# Patient Record
Sex: Male | Born: 1960
Health system: Southern US, Community
[De-identification: ages and names within clinical notes are randomized; demographics above are authoritative.]

## PROBLEM LIST (undated history)

## (undated) DIAGNOSIS — K5190 Ulcerative colitis, unspecified, without complications: Secondary | ICD-10-CM

## (undated) DIAGNOSIS — M069 Rheumatoid arthritis, unspecified: Secondary | ICD-10-CM

## (undated) DIAGNOSIS — I10 Essential (primary) hypertension: Secondary | ICD-10-CM

## (undated) HISTORY — PX: VASECTOMY: SHX75

## (undated) HISTORY — PX: HERNIA REPAIR: SHX51

## (undated) HISTORY — PX: SQUAMOUS CELL CARCINOMA EXCISION: SHX2433

## (undated) HISTORY — PX: MOLE REMOVAL: SHX2046

## (undated) HISTORY — PX: CYST REMOVAL NECK: SHX6281

## (undated) HISTORY — DX: Essential (primary) hypertension: I10

---

## 2010-04-02 ENCOUNTER — Inpatient Hospital Stay (HOSPITAL_COMMUNITY): Admission: EM | Admit: 2010-04-02 | Discharge: 2010-04-04 | Payer: Self-pay | Admitting: Internal Medicine

## 2010-04-02 ENCOUNTER — Ambulatory Visit: Payer: Self-pay | Admitting: Diagnostic Radiology

## 2010-04-02 ENCOUNTER — Encounter: Payer: Self-pay | Admitting: Emergency Medicine

## 2010-09-07 LAB — DIFFERENTIAL
Basophils Absolute: 0 10*3/uL (ref 0.0–0.1)
Eosinophils Absolute: 0.2 10*3/uL (ref 0.0–0.7)
Eosinophils Relative: 1 % (ref 0–5)
Lymphocytes Relative: 5 % — ABNORMAL LOW (ref 12–46)
Monocytes Absolute: 0.3 10*3/uL (ref 0.1–1.0)
Monocytes Relative: 10 % (ref 3–12)
Myelocytes: 0 %
Neutro Abs: 12.9 10*3/uL — ABNORMAL HIGH (ref 1.7–7.7)
Neutro Abs: 13.7 10*3/uL — ABNORMAL HIGH (ref 1.7–7.7)
Neutrophils Relative %: 60 % (ref 43–77)
nRBC: 0 /100 WBC

## 2010-09-07 LAB — CLOSTRIDIUM DIFFICILE EIA

## 2010-09-07 LAB — URINE MICROSCOPIC-ADD ON

## 2010-09-07 LAB — URINALYSIS, ROUTINE W REFLEX MICROSCOPIC
Glucose, UA: NEGATIVE mg/dL
Ketones, ur: 40 mg/dL — AB
Leukocytes, UA: NEGATIVE
pH: 6 (ref 5.0–8.0)

## 2010-09-07 LAB — CBC
HCT: 31.1 % — ABNORMAL LOW (ref 39.0–52.0)
HCT: 35.9 % — ABNORMAL LOW (ref 39.0–52.0)
HCT: 43.3 % (ref 39.0–52.0)
Hemoglobin: 10.7 g/dL — ABNORMAL LOW (ref 13.0–17.0)
Hemoglobin: 12.2 g/dL — ABNORMAL LOW (ref 13.0–17.0)
MCH: 30.4 pg (ref 26.0–34.0)
MCHC: 33.4 g/dL (ref 30.0–36.0)
MCV: 91 fL (ref 78.0–100.0)
RBC: 3.49 MIL/uL — ABNORMAL LOW (ref 4.22–5.81)
RDW: 12.5 % (ref 11.5–15.5)
RDW: 13.6 % (ref 11.5–15.5)
WBC: 13.9 10*3/uL — ABNORMAL HIGH (ref 4.0–10.5)
WBC: 17.9 10*3/uL — ABNORMAL HIGH (ref 4.0–10.5)

## 2010-09-07 LAB — HEPATITIS B SURFACE ANTIGEN: Hepatitis B Surface Ag: NEGATIVE

## 2010-09-07 LAB — URINE CULTURE

## 2010-09-07 LAB — BASIC METABOLIC PANEL
BUN: 3 mg/dL — ABNORMAL LOW (ref 6–23)
GFR calc Af Amer: >60 mL/min (ref >60)
GFR calc non Af Amer: >60 mL/min (ref >60)
GFR calc non Af Amer: >60 mL/min (ref >60)
Glucose, Bld: 146 mg/dL — ABNORMAL HIGH (ref 70–99)
Potassium: 3.4 mEq/L — ABNORMAL LOW (ref 3.5–5.1)
Potassium: 3.7 mEq/L (ref 3.5–5.1)
Sodium: 138 mEq/L (ref 135–145)
Sodium: 141 mEq/L (ref 135–145)

## 2010-09-07 LAB — COMPREHENSIVE METABOLIC PANEL
Alkaline Phosphatase: 114 U/L (ref 39–117)
BUN: 6 mg/dL (ref 6–23)
Calcium: 8.8 mg/dL (ref 8.4–10.5)
Glucose, Bld: 100 mg/dL — ABNORMAL HIGH (ref 70–99)
Total Protein: 6.9 g/dL (ref 6.0–8.3)

## 2010-11-30 ENCOUNTER — Other Ambulatory Visit: Payer: Self-pay | Admitting: Gastroenterology

## 2010-11-30 ENCOUNTER — Ambulatory Visit (HOSPITAL_COMMUNITY)
Admission: RE | Admit: 2010-11-30 | Discharge: 2010-11-30 | Disposition: A | Discharge status: Home / Self Care | Payer: PRIVATE HEALTH INSURANCE | Source: Ambulatory Visit | Attending: Gastroenterology | Admitting: Gastroenterology

## 2010-11-30 DIAGNOSIS — K5130 Ulcerative (chronic) rectosigmoiditis without complications: Secondary | ICD-10-CM | POA: Insufficient documentation

## 2010-11-30 DIAGNOSIS — R197 Diarrhea, unspecified: Secondary | ICD-10-CM | POA: Insufficient documentation

## 2010-11-30 DIAGNOSIS — Z79899 Other long term (current) drug therapy: Secondary | ICD-10-CM | POA: Insufficient documentation

## 2010-12-11 NOTE — Op Note (Signed)
  [name], [name]:  [id number]  MEDICAL RECORD NO.:  [id number]  LOCATION:  [facility]                         FACILITY:  [facility]  PHYSICIAN:  Earle Gell, M.D.   DATE OF BIRTH:  01-29-1961  DATE OF PROCEDURE:  11/30/2010 DATE OF DISCHARGE:                              OPERATIVE REPORT   PROCEDURE:  Surveillance colonoscopy.  REFERRING PHYSICIAN:  Wenda Low, MD.  HISTORY:  [name] is a 50 year old male born [birth year].  In 1994, the patient underwent a normal colonoscopy.  In 2005 and in 2008, the patient's flexible proctosigmoidoscopies showed ulcerative proctitis.  In October 2011, the patient developed Clostridium difficile colitis manifested by diarrhea, positive stool toxin, and a CT scan of the abdomen showing pancolitis.  The patient is currently taking mesalamine and reports normal bowel function.  ENDOSCOPIST:  Earle Gell, M.D.  PREMEDICATIONS:  Benadryl 50 mg, Versed 10 mg, fentanyl 100 mcg.  PROCEDURE:  Anal inspection and digital rectal exam were normal.  The Pentax pediatric colonoscope was introduced into the rectum and easily advanced to the cecum.  A normal-appearing ileocecal valve and appendiceal orifice were identified.  The distal ileum was intubated and the distal ileum was inspected.  Colonic preparation for the exam today was good.  Rectum:  The patient has moderate proctitis characterized by mucosal friability and ulceration.  There are a few small pseudopolyps in the proximal rectum.  Sigmoid colon:  There is moderate sigmoiditis manifested by friable mucosa, ulceration, and a few small pseudo polyps.  Descending colon normal.  Splenic flexure normal.  Transverse colon normal.  Hepatic flexure normal.  Ascending colon normal.  Cecum and ileocecal valve normal.  Distal ileum normal.  BIOPSIES:  Eight biopsies were taken from the right colon, eight biopsies were taken from the  transverse colon, eight biopsies were taken from the descending colon, eight biopsies were taken from the rectosigmoid colon.  Biopsies were submitted in a separate bottle for the pseudopolyps in the sigmoid colon.  ASSESSMENT:  Moderate proctosigmoiditis.  A normal-appearing mucosa extending from the distal descending colon to the cecum.  Terminal ileum appears normal.  Surveillance biopsies performed along the length of the colon pending.          ______________________________ Earle Gell, M.D.     MJ/MEDQ  D:  11/30/2010  T:  11/30/2010  Job:  [id number]  cc:   Wenda Low, MD Fax: [phone]  Electronically Signed by Earle Gell M.D. on 12/11/2010 04:15:40 PM

## 2011-03-15 ENCOUNTER — Other Ambulatory Visit: Payer: Self-pay | Admitting: Internal Medicine

## 2011-03-15 ENCOUNTER — Ambulatory Visit
Admission: RE | Admit: 2011-03-15 | Discharge: 2011-03-15 | Disposition: A | Discharge status: Home / Self Care | Payer: PRIVATE HEALTH INSURANCE | Source: Ambulatory Visit | Attending: Internal Medicine | Admitting: Internal Medicine

## 2011-03-15 DIAGNOSIS — R609 Edema, unspecified: Secondary | ICD-10-CM

## 2011-03-15 DIAGNOSIS — R52 Pain, unspecified: Secondary | ICD-10-CM

## 2013-08-16 ENCOUNTER — Encounter (HOSPITAL_COMMUNITY): Payer: Self-pay | Admitting: Emergency Medicine

## 2013-08-16 ENCOUNTER — Inpatient Hospital Stay (HOSPITAL_COMMUNITY)
Admission: EM | Admit: 2013-08-16 | Discharge: 2013-08-20 | DRG: 387 | Disposition: A | Discharge status: Home / Self Care | Payer: PRIVATE HEALTH INSURANCE | Attending: Internal Medicine | Admitting: Internal Medicine

## 2013-08-16 DIAGNOSIS — K5100 Ulcerative (chronic) pancolitis without complications: Principal | ICD-10-CM | POA: Diagnosis present

## 2013-08-16 DIAGNOSIS — R112 Nausea with vomiting, unspecified: Secondary | ICD-10-CM | POA: Diagnosis present

## 2013-08-16 DIAGNOSIS — D638 Anemia in other chronic diseases classified elsewhere: Secondary | ICD-10-CM | POA: Diagnosis present

## 2013-08-16 DIAGNOSIS — D126 Benign neoplasm of colon, unspecified: Secondary | ICD-10-CM | POA: Diagnosis present

## 2013-08-16 DIAGNOSIS — D72829 Elevated white blood cell count, unspecified: Secondary | ICD-10-CM

## 2013-08-16 DIAGNOSIS — Z862 Personal history of diseases of the blood and blood-forming organs and certain disorders involving the immune mechanism: Secondary | ICD-10-CM | POA: Diagnosis present

## 2013-08-16 DIAGNOSIS — D473 Essential (hemorrhagic) thrombocythemia: Secondary | ICD-10-CM | POA: Diagnosis present

## 2013-08-16 DIAGNOSIS — D500 Iron deficiency anemia secondary to blood loss (chronic): Secondary | ICD-10-CM | POA: Diagnosis present

## 2013-08-16 DIAGNOSIS — K5190 Ulcerative colitis, unspecified, without complications: Secondary | ICD-10-CM | POA: Diagnosis present

## 2013-08-16 DIAGNOSIS — D75839 Thrombocytosis, unspecified: Secondary | ICD-10-CM

## 2013-08-16 DIAGNOSIS — D649 Anemia, unspecified: Secondary | ICD-10-CM

## 2013-08-16 HISTORY — DX: Ulcerative colitis, unspecified, without complications: K51.90

## 2013-08-16 LAB — CBC WITH DIFFERENTIAL/PLATELET
BASOS ABS: 0 10*3/uL (ref 0.0–0.1)
Basophils Relative: 0 % (ref 0–1)
EOS ABS: 0 10*3/uL (ref 0.0–0.7)
Eosinophils Relative: 0 % (ref 0–5)
HCT: 40.1 % (ref 39.0–52.0)
Hemoglobin: 13.8 g/dL (ref 13.0–17.0)
LYMPHS PCT: 4 % — AB (ref 12–46)
Lymphs Abs: 1.1 10*3/uL (ref 0.7–4.0)
MCH: 31 pg (ref 26.0–34.0)
MCHC: 34.4 g/dL (ref 30.0–36.0)
MCV: 90.1 fL (ref 78.0–100.0)
MONOS PCT: 11 % (ref 3–12)
Monocytes Absolute: 3.2 10*3/uL — ABNORMAL HIGH (ref 0.1–1.0)
NEUTROS ABS: 24.4 10*3/uL — AB (ref 1.7–7.7)
NEUTROS PCT: 85 % — AB (ref 43–77)
PLATELETS: 575 10*3/uL — AB (ref 150–400)
RBC: 4.45 MIL/uL (ref 4.22–5.81)
RDW: 13.1 % (ref 11.5–15.5)
WBC MORPHOLOGY: INCREASED
WBC: 28.7 10*3/uL — AB (ref 4.0–10.5)

## 2013-08-16 LAB — COMPREHENSIVE METABOLIC PANEL
ALBUMIN: 3.1 g/dL — AB (ref 3.5–5.2)
ALK PHOS: 99 U/L (ref 39–117)
ALT: 10 U/L (ref 0–53)
AST: 11 U/L (ref 0–37)
BUN: 7 mg/dL (ref 6–23)
CO2: 24 mEq/L (ref 19–32)
Calcium: 9.1 mg/dL (ref 8.4–10.5)
Chloride: 96 mEq/L (ref 96–112)
Creatinine, Ser: 1.18 mg/dL (ref 0.50–1.35)
GFR calc Af Amer: 81 mL/min — ABNORMAL LOW (ref >90)
GFR calc non Af Amer: 70 mL/min — ABNORMAL LOW (ref >90)
Glucose, Bld: 131 mg/dL — ABNORMAL HIGH (ref 70–99)
POTASSIUM: 3.7 meq/L (ref 3.7–5.3)
SODIUM: 136 meq/L — AB (ref 137–147)
TOTAL PROTEIN: 7.1 g/dL (ref 6.0–8.3)
Total Bilirubin: 0.5 mg/dL (ref 0.3–1.2)

## 2013-08-16 LAB — URINALYSIS, ROUTINE W REFLEX MICROSCOPIC
GLUCOSE, UA: NEGATIVE mg/dL
Ketones, ur: NEGATIVE mg/dL
LEUKOCYTES UA: NEGATIVE
NITRITE: NEGATIVE
PH: 6 (ref 5.0–8.0)
Protein, ur: 100 mg/dL — AB
SPECIFIC GRAVITY, URINE: 1.029 (ref 1.005–1.030)
Urobilinogen, UA: 1 mg/dL (ref 0.0–1.0)

## 2013-08-16 LAB — SEDIMENTATION RATE: Sed Rate: 92 mm/hr — ABNORMAL HIGH (ref 0–16)

## 2013-08-16 LAB — URINE MICROSCOPIC-ADD ON

## 2013-08-16 LAB — LIPASE, BLOOD: LIPASE: 17 U/L (ref 11–59)

## 2013-08-16 LAB — PROCALCITONIN: PROCALCITONIN: 0.27 ng/mL

## 2013-08-16 MED ORDER — ONDANSETRON HCL 4 MG/2ML IJ SOLN
4.0000 mg | Freq: Once | INTRAMUSCULAR | Status: AC
  Administered 2013-08-16: 4 mg via INTRAVENOUS
  Filled 2013-08-16: qty 2

## 2013-08-16 MED ORDER — SODIUM CHLORIDE 0.9 % IV SOLN
1000.0000 mL | Freq: Once | INTRAVENOUS | Status: AC
  Administered 2013-08-16: 1000 mL via INTRAVENOUS

## 2013-08-16 MED ORDER — SULFASALAZINE 500 MG PO TABS
500.0000 mg | ORAL_TABLET | Freq: Two times a day (BID) | ORAL | Status: DC
Start: 2013-08-17 — End: 2013-08-20
  Administered 2013-08-17 – 2013-08-20 (×7): 500 mg via ORAL
  Filled 2013-08-16 (×8): qty 1

## 2013-08-16 MED ORDER — ACETAMINOPHEN 325 MG PO TABS: 650.0000 mg | ORAL_TABLET | Freq: Four times a day (QID) | ORAL | Status: DC | PRN

## 2013-08-16 MED ORDER — MORPHINE SULFATE 2 MG/ML IJ SOLN: 1.0000 mg | INTRAMUSCULAR | Status: DC | PRN

## 2013-08-16 MED ORDER — OXYCODONE HCL 5 MG PO TABS: 5.0000 mg | ORAL_TABLET | ORAL | Status: DC | PRN

## 2013-08-16 MED ORDER — ONDANSETRON HCL 4 MG PO TABS: 4.0000 mg | ORAL_TABLET | Freq: Four times a day (QID) | ORAL | Status: DC | PRN

## 2013-08-16 MED ORDER — ENOXAPARIN SODIUM 40 MG/0.4ML ~~LOC~~ SOLN
40.0000 mg | SUBCUTANEOUS | Status: DC
  Administered 2013-08-16: 40 mg via SUBCUTANEOUS
  Filled 2013-08-16 (×2): qty 0.4

## 2013-08-16 MED ORDER — METHYLPREDNISOLONE SODIUM SUCC 40 MG IJ SOLR
40.0000 mg | Freq: Two times a day (BID) | INTRAMUSCULAR | Status: DC
Start: 2013-08-16 — End: 2013-08-20
  Administered 2013-08-16 – 2013-08-20 (×8): 40 mg via INTRAVENOUS
  Filled 2013-08-16 (×10): qty 1

## 2013-08-16 MED ORDER — ONDANSETRON HCL 4 MG/2ML IJ SOLN: 4.0000 mg | Freq: Four times a day (QID) | INTRAMUSCULAR | Status: DC | PRN

## 2013-08-16 MED ORDER — ACETAMINOPHEN 650 MG RE SUPP: 650.0000 mg | Freq: Four times a day (QID) | RECTAL | Status: DC | PRN

## 2013-08-16 MED ORDER — SODIUM CHLORIDE 0.9 % IV SOLN
INTRAVENOUS | Status: DC
  Administered 2013-08-16 – 2013-08-19 (×8): via INTRAVENOUS

## 2013-08-16 MED ORDER — ONDANSETRON HCL 4 MG/2ML IJ SOLN: 4.0000 mg | Freq: Three times a day (TID) | INTRAMUSCULAR | Status: AC | PRN

## 2013-08-16 NOTE — ED Notes (Signed)
Pt from home reports that he has hx of ulcerative colitis, flare for 2 weeks. Pt reports diarrhea, blood in stool. Emesis started yesterday. Pt unable to keep fluids down. Pt is A&O and in NAD. Pt PCP is testing pt for Cdiff at this time, results to be available in 2 days.

## 2013-08-16 NOTE — ED Provider Notes (Signed)
CSN: [id number]     Arrival date & time 08/16/13  1356 History   First MD Initiated Contact with Patient 08/16/13 [phone]     Chief Complaint  Patient presents with  . ulcerative colitis flare up   . vomiting   . Diarrhea     (Consider location/radiation/quality/duration/timing/severity/associated sxs/prior Treatment) HPI Comments: Patient presents with bloody diarrhea and nausea and vomiting. He has a history of ulcerative colitis and over the last 2 weeks she's had some intermittent diarrhea with some mixed bloody stools. He states the diarrhea is been increasing over the last few days. Today he started having nausea and vomiting and hasn't been able to keep anything down. He denies any fevers or chills. Has had some night sweats. He's followed by Dr[facility]. He saw Dr. Wynetta Emery on Friday which was 2 days ago. He did take a stool sample at that time as the patient has had a history of C. difficile 2 years ago. This is still pending.  He denies any recent antibiotic usage. He states his symptoms feel like it could be C. difficile flareup versus ulcerative colitis flareup. He denies any abdominal pain. He denies any urinary symptoms.  Patient is a 54 y.o. male presenting with diarrhea.  Diarrhea Associated symptoms: vomiting   Associated symptoms: no abdominal pain, no arthralgias, no chills, no diaphoresis, no fever and no headaches     Past Medical History  Diagnosis Date  . Ulcerative colitis    Past Surgical History  Procedure Laterality Date  . Hernia repair      2009   History reviewed. No pertinent family history. History  Substance Use Topics  . Smoking status: Never Smoker   . Smokeless tobacco: Not on file  . Alcohol Use: No    Review of Systems  Constitutional: Positive for appetite change and fatigue. Negative for fever, chills and diaphoresis.  HENT: Negative for congestion, rhinorrhea and sneezing.   Eyes: Negative.   Respiratory:  Negative for cough, chest tightness and shortness of breath.   Cardiovascular: Negative for chest pain and leg swelling.  Gastrointestinal: Positive for nausea, vomiting, diarrhea and blood in stool. Negative for abdominal pain.  Genitourinary: Negative for frequency, hematuria, flank pain and difficulty urinating.  Musculoskeletal: Negative for arthralgias and back pain.  Skin: Negative for rash.  Neurological: Negative for dizziness, speech difficulty, weakness, numbness and headaches.      Allergies  Augmentin and Flagyl  Home Medications   Current Outpatient Rx  Name  Route  Sig  Dispense  Refill  . sulfaSALAzine (AZULFIDINE) 500 MG tablet   Oral   Take 500 mg by mouth 2 (two) times daily.          BP 126/76  Pulse 108  Temp(Src) 98.7 F (37.1 C) (Oral)  Resp 18  SpO2 97% Physical Exam  Constitutional: He is oriented to person, place, and time. He appears well-developed and well-nourished.  HENT:  Head: Normocephalic and atraumatic.  Eyes: Pupils are equal, round, and reactive to light.  Neck: Normal range of motion. Neck supple.  Cardiovascular: Regular rhythm and normal heart sounds.  Tachycardia present.   Pulmonary/Chest: Effort normal and breath sounds normal. No respiratory distress. He has no wheezes. He has no rales. He exhibits no tenderness.  Abdominal: Soft. Bowel sounds are normal. There is no tenderness. There is no rebound and no guarding.  Musculoskeletal: Normal range of motion. He exhibits no edema.  Lymphadenopathy:    He has no cervical  adenopathy.  Neurological: He is alert and oriented to person, place, and time.  Skin: Skin is warm and dry. No rash noted.  Psychiatric: He has a normal mood and affect.    ED Course  Procedures (including critical care time) Labs Review Results for orders placed during the hospital encounter of 08/16/13  CBC WITH DIFFERENTIAL      Result Value Ref Range   WBC 28.7 (*) 4.0 - 10.5 K/uL   RBC 4.45  4.22 -  5.81 MIL/uL   Hemoglobin 13.8  13.0 - 17.0 g/dL   HCT 40.1  39.0 - 52.0 %   MCV 90.1  78.0 - 100.0 fL   MCH 31.0  26.0 - 34.0 pg   MCHC 34.4  30.0 - 36.0 g/dL   RDW 13.1  11.5 - 15.5 %   Platelets 575 (*) 150 - 400 K/uL   Neutrophils Relative % 85 (*) 43 - 77 %   Lymphocytes Relative 4 (*) 12 - 46 %   Monocytes Relative 11  3 - 12 %   Eosinophils Relative 0  0 - 5 %   Basophils Relative 0  0 - 1 %   Neutro Abs 24.4 (*) 1.7 - 7.7 K/uL   Lymphs Abs 1.1  0.7 - 4.0 K/uL   Monocytes Absolute 3.2 (*) 0.1 - 1.0 K/uL   Eosinophils Absolute 0.0  0.0 - 0.7 K/uL   Basophils Absolute 0.0  0.0 - 0.1 K/uL   WBC Morphology INCREASED BANDS (>20% BANDS)    COMPREHENSIVE METABOLIC PANEL      Result Value Ref Range   Sodium 136 (*) 137 - 147 mEq/L   Potassium 3.7  3.7 - 5.3 mEq/L   Chloride 96  96 - 112 mEq/L   CO2 24  19 - 32 mEq/L   Glucose, Bld 131 (*) 70 - 99 mg/dL   BUN 7  6 - 23 mg/dL   Creatinine, Ser 1.18  0.50 - 1.35 mg/dL   Calcium 9.1  8.4 - 10.5 mg/dL   Total Protein 7.1  6.0 - 8.3 g/dL   Albumin 3.1 (*) 3.5 - 5.2 g/dL   AST 11  0 - 37 U/L   ALT 10  0 - 53 U/L   Alkaline Phosphatase 99  39 - 117 U/L   Total Bilirubin 0.5  0.3 - 1.2 mg/dL   GFR calc non Af Amer 70 (*) >90 mL/min   GFR calc Af Amer 81 (*) >90 mL/min  LIPASE, BLOOD      Result Value Ref Range   Lipase 17  11 - 59 U/L  URINALYSIS, ROUTINE W REFLEX MICROSCOPIC      Result Value Ref Range   Color, Urine AMBER (*) YELLOW   APPearance CLEAR  CLEAR   Specific Gravity, Urine 1.029  1.005 - 1.030   pH 6.0  5.0 - 8.0   Glucose, UA NEGATIVE  NEGATIVE mg/dL   Hgb urine dipstick TRACE (*) NEGATIVE   Bilirubin Urine SMALL (*) NEGATIVE   Ketones, ur NEGATIVE  NEGATIVE mg/dL   Protein, ur 100 (*) NEGATIVE mg/dL   Urobilinogen, UA 1.0  0.0 - 1.0 mg/dL   Nitrite NEGATIVE  NEGATIVE   Leukocytes, UA NEGATIVE  NEGATIVE  URINE MICROSCOPIC-ADD ON      Result Value Ref Range   Bacteria, UA MANY (*) RARE   Urine-Other MUCOUS  PRESENT     No results found.   Imaging Review No results found.  EKG Interpretation  None       MDM   Final diagnoses:  Ulcerative colitis    Patient presents with bloody diarrhea and associated vomiting. He denies any fevers or abdominal pain. He was fairly tachycardic on arrival however this has improved with IV fluids. His blood pressures been stable. He has no fevers or abdominal tenderness. At this point I did not feel that he needed CT imaging of his abdomen. I did speak with Dr. Penny Pia who is the gastroenterologist on call and he was able to get the patient's C. difficile result which is negative. At this point he wants to hold off on antibiotics and start him on Solu-Medrol 40 mg twice a day. Given his diarrhea, vomiting and markedly elevated white count we will go ahead and admit him overnight for observation and fluids. I discussed with the hospitalist who will admit the patient.    Malvin Johns, MD 08/16/13 [phone]

## 2013-08-16 NOTE — H&P (Signed)
[facility] History and Physical  Kenneth Payne:[id number] DOB: [birth year] DOA: 08/16/2013  Referring physician: edp PCP: No primary provider on file.   Chief Complaint: bloody diarrhea since 2 weeks.   HPI: Kenneth Payne is a 53 y.o. male with prior h/o ulcerative colitis, recent colonoscopy more than 2 years ago, follows up with Dr Wynetta Emery as outpatient, came in complaining of bloody diarrhea since 2 weeks. He reports steroid use 2 months ago for UC flare up. He denies the use of antibiotics he also reports nausea, vomiting and occasional abdominal cramps with diarrea. He denies fevers or chills. He denies any other complaints. He reports he saw Dr Wynetta Emery as outpatient on Thursday and he was tested for C DIFF,which was negative.  EDP called Dr Watt Climes on call for [name] recommended IV solumedrol BID and to monitor . He was referred to medical service for admission for management of UC flare up.    Review of Systems:  Constitutional:  No weight loss, night sweats, Fevers, chills, fatigue.  HEENT:  No headaches, Difficulty swallowing,Tooth/dental problems,Sore throat,  No sneezing, itching, ear ache, nasal congestion, post nasal drip,  Cardio-vascular:  No chest pain, Orthopnea, PND, swelling in lower extremities, anasarca, dizziness, palpitations  GI:  Positive for  abdominal pain, nausea, vomiting, diarrhea, change in bowel habits, loss of appetite  Resp:  No shortness of breath with exertion or at rest. No excess mucus, no productive cough, No non-productive cough, No coughing up of blood.No change in color of mucus.No wheezing.No chest wall deformity  Skin:  no rash or lesions.  GU:  no dysuria, change in color of urine, no urgency or frequency. No flank pain.  Musculoskeletal:  No joint pain or swelling. No decreased range of motion. No back pain.  Psych:  No change in mood or affect. No depression or anxiety. No memory loss.   Past Medical History  Diagnosis  Date  . Ulcerative colitis    Past Surgical History  Procedure Laterality Date  . Hernia repair      2009   Social History:  reports that he has never smoked. He does not have any smokeless tobacco history on file. He reports that he does not drink alcohol or use illicit drugs.  Allergies  Allergen Reactions  . Augmentin [Amoxicillin-Pot Clavulanate] Diarrhea and Nausea Only  . Flagyl [Metronidazole] Diarrhea and Nausea Only    History reviewed. No pertinent family history.   Prior to Admission medications   Medication Sig Start Date End Date Taking? Authorizing Provider  sulfaSALAzine (AZULFIDINE) 500 MG tablet Take 500 mg by mouth 2 (two) times daily.   Yes Historical Provider, MD   Physical Exam: Filed Vitals:   08/16/13 1611  BP: 126/76  Pulse: 108  Temp: 98.7 F (37.1 C)  Resp: 18    BP 126/76  Pulse 108  Temp(Src) 98.7 F (37.1 C) (Oral)  Resp 18  SpO2 97%  General:  Appears calm and comfortable Eyes: PERRL, normal lids, irises & conjunctiva ENT: grossly normal hearing, lips & tongue Neck: no LAD, masses or thyromegaly Cardiovascular: RRR, no m/r/g. No LE edema. Telemetry: SR, no arrhythmias  Respiratory: CTA bilaterally, no w/r/r. Normal respiratory effort. Abdomen: soft, ntnd Skin: no rash or induration seen on limited exam Musculoskeletal: grossly normal tone BUE/BLE Psychiatric: grossly normal mood and affect, speech fluent and appropriate Neurologic: grossly non-focal.          Labs on Admission:  Basic Metabolic Panel:  Recent Labs Lab  08/16/13 1445  NA 136*  K 3.7  CL 96  CO2 24  GLUCOSE 131*  BUN 7  CREATININE 1.18  CALCIUM 9.1   Liver Function Tests:  Recent Labs Lab 08/16/13 1445  AST 11  ALT 10  ALKPHOS 99  BILITOT 0.5  PROT 7.1  ALBUMIN 3.1*    Recent Labs Lab 08/16/13 1445  LIPASE 17   No results found for this basename: AMMONIA,  in the last 168 hours CBC:  Recent Labs Lab 08/16/13 1445  WBC 28.7*    NEUTROABS 24.4*  HGB 13.8  HCT 40.1  MCV 90.1  PLT 575*   Cardiac Enzymes: No results found for this basename: CKTOTAL, CKMB, CKMBINDEX, TROPONINI,  in the last 168 hours  BNP (last 3 results) No results found for this basename: PROBNP,  in the last 8760 hours CBG: No results found for this basename: GLUCAP,  in the last 168 hours  Radiological Exams on Admission: No results found.  EKG: not done  Assessment/Plan Active Problems:   Ulcerative colitis   Ulcerative colitis, acute   1. Bloody diarrhea with nausea, vomiting and abdominal cramps - possibly secondar to UC flare up. - started pt on IV steroids, iv FLUIDS, IV anti emetics.  - pain control - stool for pcr - please call [facility] in am.  - clear liquid diet.  - sulfasalazine 527m bid.    2. Leukocytosis: - possibly reactive  - repeat cbc in am.   3. DVT prophylaxis.   Code Status: full code Family Communication: family at bedside.  Disposition Plan: admit to inpatient.   Time spent: 75 min  [facility] Pager [phone]

## 2013-08-16 NOTE — ED Notes (Signed)
Pt reports ulcerative colitis flare up x1 week, Dr Wynetta Emery pt GI doctor. Saw Dr Wynetta Emery on Thursday. Over the weekend s/s increased, bloody stools, nausea, vomiting, and night sweats. Denies pain.

## 2013-08-16 NOTE — ED Notes (Signed)
Attempted to call report, RN was "giving an enema" and unable to take report. RN to call when finished

## 2013-08-16 NOTE — ED Notes (Signed)
Tim, RN transporting pt to floor

## 2013-08-17 LAB — COMPREHENSIVE METABOLIC PANEL
ALT: 9 U/L (ref 0–53)
AST: 8 U/L (ref 0–37)
Albumin: 2.6 g/dL — ABNORMAL LOW (ref 3.5–5.2)
Alkaline Phosphatase: 88 U/L (ref 39–117)
BUN: 6 mg/dL (ref 6–23)
CALCIUM: 8.4 mg/dL (ref 8.4–10.5)
CO2: 26 meq/L (ref 19–32)
Chloride: 102 mEq/L (ref 96–112)
Creatinine, Ser: 0.88 mg/dL (ref 0.50–1.35)
GFR calc Af Amer: >90 mL/min (ref >90)
Glucose, Bld: 128 mg/dL — ABNORMAL HIGH (ref 70–99)
Potassium: 4.3 mEq/L (ref 3.7–5.3)
SODIUM: 141 meq/L (ref 137–147)
Total Bilirubin: 0.3 mg/dL (ref 0.3–1.2)
Total Protein: 6.5 g/dL (ref 6.0–8.3)

## 2013-08-17 LAB — CBC
HEMATOCRIT: 35.7 % — AB (ref 39.0–52.0)
HEMOGLOBIN: 11.8 g/dL — AB (ref 13.0–17.0)
MCH: 30.2 pg (ref 26.0–34.0)
MCHC: 33.1 g/dL (ref 30.0–36.0)
MCV: 91.3 fL (ref 78.0–100.0)
Platelets: 567 10*3/uL — ABNORMAL HIGH (ref 150–400)
RBC: 3.91 MIL/uL — ABNORMAL LOW (ref 4.22–5.81)
RDW: 13.2 % (ref 11.5–15.5)
WBC: 24.6 10*3/uL — ABNORMAL HIGH (ref 4.0–10.5)

## 2013-08-17 MED ORDER — ONDANSETRON HCL 4 MG/2ML IJ SOLN: 4.0000 mg | Freq: Four times a day (QID) | INTRAMUSCULAR | Status: DC | PRN

## 2013-08-17 NOTE — Consult Note (Signed)
[facility] CONSULT Reason for consult: diarrhea in rectal bleeding young man with a history of ulcers colitis Referring Physician: [facility]. Primary G.I.: Dr. Bing Ree is an 53 y.o. male.  HPI: he has a history of ulcers colitis dating back to the 1990s. Class colonoscopy by Dr. Wynetta Emery 2/12 showed left-sided ulcerative colitis. Patient has been treated with sulfasalazine for many years and has generally been in remission. He had been doing well until this past weekend when he developed nausea and vomiting in sudden onset of diarrhea that subsequently became bloody and has been bloody ever since. Currently others in the family were sick. He reports at prior to this, is stools have been getting somewhat looser and he had seen Dr. Wynetta Emery in the office about a week ago with C. desktops and negative. The patient had had a history of prior C. difficile diarrhea. He feels better since coming to the hospital and getting IV fluid but is still having bloody diarrhea about file bowel movements today even clear liquids only. He has been started on IV Solu-Medrol is been maintained in sulfasalazine.  Past Medical History  Diagnosis Date  . Ulcerative colitis     Past Surgical History  Procedure Laterality Date  . Hernia repair      2009    History reviewed. No pertinent family history.  Social History:  reports that he has never smoked. He does not have any smokeless tobacco history on file. He reports that he does not drink alcohol or use illicit drugs.  Allergies:  Allergies  Allergen Reactions  . Augmentin [Amoxicillin-Pot Clavulanate] Diarrhea and Nausea Only  . Flagyl [Metronidazole] Diarrhea and Nausea Only    Medications; Prior to Admission medications   Medication Sig Start Date End Date Taking? Authorizing Provider  sulfaSALAzine (AZULFIDINE) 500 MG tablet Take 500 mg by mouth 2 (two) times daily.   Yes Historical Provider, MD   . methylPREDNISolone  (SOLU-MEDROL) injection  40 mg Intravenous Q12H  . sulfaSALAzine  500 mg Oral BID   PRN Meds acetaminophen, acetaminophen, morphine injection, ondansetron (ZOFRAN) IV, ondansetron, oxyCODONE Results for orders placed during the hospital encounter of 08/16/13 (from the past 48 hour(s))  URINALYSIS, ROUTINE W REFLEX MICROSCOPIC     Status: Abnormal   Collection Time    08/16/13  2:06 PM      Result Value Ref Range   Color, Urine AMBER (*) YELLOW   Comment: BIOCHEMICALS MAY BE AFFECTED BY COLOR   APPearance CLEAR  CLEAR   Specific Gravity, Urine 1.029  1.005 - 1.030   pH 6.0  5.0 - 8.0   Glucose, UA NEGATIVE  NEGATIVE mg/dL   Hgb urine dipstick TRACE (*) NEGATIVE   Bilirubin Urine SMALL (*) NEGATIVE   Ketones, ur NEGATIVE  NEGATIVE mg/dL   Protein, ur 100 (*) NEGATIVE mg/dL   Urobilinogen, UA 1.0  0.0 - 1.0 mg/dL   Nitrite NEGATIVE  NEGATIVE   Leukocytes, UA NEGATIVE  NEGATIVE  URINE MICROSCOPIC-ADD ON     Status: Abnormal   Collection Time    08/16/13  2:06 PM      Result Value Ref Range   Bacteria, UA MANY (*) RARE   Urine-Other MUCOUS PRESENT     Comment: AMORPHOUS URATES/PHOSPHATES  CBC WITH DIFFERENTIAL     Status: Abnormal   Collection Time    08/16/13  2:45 PM      Result Value Ref Range   WBC 28.7 (*) 4.0 - 10.5 K/uL  Comment: WHITE COUNT CONFIRMED ON SMEAR   RBC 4.45  4.22 - 5.81 MIL/uL   Hemoglobin 13.8  13.0 - 17.0 g/dL   HCT 40.1  39.0 - 52.0 %   MCV 90.1  78.0 - 100.0 fL   MCH 31.0  26.0 - 34.0 pg   MCHC 34.4  30.0 - 36.0 g/dL   RDW 13.1  11.5 - 15.5 %   Platelets 575 (*) 150 - 400 K/uL   Neutrophils Relative % 85 (*) 43 - 77 %   Lymphocytes Relative 4 (*) 12 - 46 %   Monocytes Relative 11  3 - 12 %   Eosinophils Relative 0  0 - 5 %   Basophils Relative 0  0 - 1 %   Neutro Abs 24.4 (*) 1.7 - 7.7 K/uL   Lymphs Abs 1.1  0.7 - 4.0 K/uL   Monocytes Absolute 3.2 (*) 0.1 - 1.0 K/uL   Eosinophils Absolute 0.0  0.0 - 0.7 K/uL   Basophils Absolute 0.0  0.0 - 0.1  K/uL   WBC Morphology INCREASED BANDS (>20% BANDS)    COMPREHENSIVE METABOLIC PANEL     Status: Abnormal   Collection Time    08/16/13  2:45 PM      Result Value Ref Range   Sodium 136 (*) 137 - 147 mEq/L   Potassium 3.7  3.7 - 5.3 mEq/L   Chloride 96  96 - 112 mEq/L   CO2 24  19 - 32 mEq/L   Glucose, Bld 131 (*) 70 - 99 mg/dL   BUN 7  6 - 23 mg/dL   Creatinine, Ser 1.18  0.50 - 1.35 mg/dL   Calcium 9.1  8.4 - 10.5 mg/dL   Total Protein 7.1  6.0 - 8.3 g/dL   Albumin 3.1 (*) 3.5 - 5.2 g/dL   AST 11  0 - 37 U/L   ALT 10  0 - 53 U/L   Alkaline Phosphatase 99  39 - 117 U/L   Total Bilirubin 0.5  0.3 - 1.2 mg/dL   GFR calc non Af Amer 70 (*) >90 mL/min   GFR calc Af Amer 81 (*) >90 mL/min   Comment: (NOTE)     The eGFR has been calculated using the CKD EPI equation.     This calculation has not been validated in all clinical situations.     eGFR's persistently <90 mL/min signify possible Chronic Kidney     Disease.  LIPASE, BLOOD     Status: None   Collection Time    08/16/13  2:45 PM      Result Value Ref Range   Lipase 17  11 - 59 U/L  SEDIMENTATION RATE     Status: Abnormal   Collection Time    08/16/13  2:45 PM      Result Value Ref Range   Sed Rate 92 (*) 0 - 16 mm/hr  PROCALCITONIN     Status: None   Collection Time    08/16/13  2:45 PM      Result Value Ref Range   Procalcitonin 0.27     Comment:            Interpretation:     PCT (Procalcitonin) <= 0.5 ng/mL:     Systemic infection (sepsis) is not likely.     Local bacterial infection is possible.     (NOTE)             ICU PCT Algorithm  Non ICU PCT Algorithm        ----------------------------     ------------------------------             PCT < 0.25 ng/mL                 PCT < 0.1 ng/mL         Stopping of antibiotics            Stopping of antibiotics           strongly encouraged.               strongly encouraged.        ----------------------------     ------------------------------            PCT level decrease by               PCT < 0.25 ng/mL           >= 80% from peak PCT           OR PCT 0.25 - 0.5 ng/mL          Stopping of antibiotics                                                 encouraged.         Stopping of antibiotics               encouraged.        ----------------------------     ------------------------------           PCT level decrease by              PCT >= 0.25 ng/mL           < 80% from peak PCT            AND PCT >= 0.5 ng/mL            Continuing antibiotics                                                  encouraged.           Continuing antibiotics                encouraged.        ----------------------------     ------------------------------         PCT level increase compared          PCT > 0.5 ng/mL             with peak PCT AND              PCT >= 0.5 ng/mL             Escalation of antibiotics                                              strongly encouraged.          Escalation of antibiotics            strongly encouraged.  COMPREHENSIVE METABOLIC PANEL  Status: Abnormal   Collection Time    08/17/13  5:26 AM      Result Value Ref Range   Sodium 141  137 - 147 mEq/L   Potassium 4.3  3.7 - 5.3 mEq/L   Chloride 102  96 - 112 mEq/L   CO2 26  19 - 32 mEq/L   Glucose, Bld 128 (*) 70 - 99 mg/dL   BUN 6  6 - 23 mg/dL   Creatinine, Ser 0.88  0.50 - 1.35 mg/dL   Calcium 8.4  8.4 - 10.5 mg/dL   Total Protein 6.5  6.0 - 8.3 g/dL   Albumin 2.6 (*) 3.5 - 5.2 g/dL   AST 8  0 - 37 U/L   ALT 9  0 - 53 U/L   Alkaline Phosphatase 88  39 - 117 U/L   Total Bilirubin 0.3  0.3 - 1.2 mg/dL   GFR calc non Af Amer >90  >90 mL/min   GFR calc Af Amer >90  >90 mL/min   Comment: (NOTE)     The eGFR has been calculated using the CKD EPI equation.     This calculation has not been validated in all clinical situations.     eGFR's persistently <90 mL/min signify possible Chronic Kidney     Disease.  CBC     Status: Abnormal   Collection Time     08/17/13  5:26 AM      Result Value Ref Range   WBC 24.6 (*) 4.0 - 10.5 K/uL   RBC 3.91 (*) 4.22 - 5.81 MIL/uL   Hemoglobin 11.8 (*) 13.0 - 17.0 g/dL   HCT 35.7 (*) 39.0 - 52.0 %   MCV 91.3  78.0 - 100.0 fL   MCH 30.2  26.0 - 34.0 pg   MCHC 33.1  30.0 - 36.0 g/dL   RDW 13.2  11.5 - 15.5 %   Platelets 567 (*) 150 - 400 K/uL    No results found.             Blood pressure 122/63, pulse 93, temperature 98 F (36.7 C), temperature source Oral, resp. rate 20, height 5' 7"  (1.702 m), weight 78.2 kg (172 lb 6.4 oz), SpO2 97.00%.  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   Assessment: 1. Bloody diarrhea. This is occurred in a young man with a known history of left sided ulcerative colitis. The stools have been getting looser but this is fairly sudden onset and could be infectious. He's had a prior history of C. difficile diarrhea but toxin was negative last week. G.I. pathogen panel is currently pending Elevated WBC concerning. Have discussed this in detail with the patient 2. Ulcerative colitis. 20 year history of this with last colonoscopy 3 years ago 3. History of C. difficile diarrhea. Toxins negative in office last week  Plan: 1. We will tentatively plan colonoscopy tomorrow after abbreviated prep. The endoscopy unit is basically full and cannot perform the procedure till late tomorrow afternoon. Therefore, we will begin the prep in the morning. If his G.I. pathogen panel comes back revealing infectious diarrhea we could hold off. Patient aware of the procedure and risk.   Jacey Eckerson JR,Orazio L 08/17/2013, 3:49 PM

## 2013-08-17 NOTE — Progress Notes (Addendum)
[facility] PROGRESS NOTE  Kenneth Payne XHF:[id number] DOB: 10-Jun-1961 DOA: 08/16/2013 PCP: No primary provider on file.  Assessment/Plan: 1-Diarrhea, Hematochezia; Possible secondary to ulcerative colitis flare.  -Continue with 40 mg IV Solumedrol.  -Sulfasalazine.  -GI consulted.  -WBC trending down. -Will defer to GI ordering CT scan abdomen.  -follow CBC.  2-Leukocytosis: could be related to ulcerative colitis. Pro-calcitonin at 0.27. Trending down. Patient afebrile. Monitor. Follow GI stool pathogen.   3-DVT prophylaxis; discontinue Lovenox in setting GI bleed.    Code Status: Full Code.  Family Communication: Care discussed with patient.  Disposition Plan: remain inpatient.    Consultants:  [facility].   Procedures:  none  Antibiotics: none   HPI/Subjective: Nausea has improved. No abdominal pain. Diarrhea persist. Bloody stool.   Objective: Filed Vitals:   08/17/13 1332  BP: 122/63  Pulse: 93  Temp: 98 F (36.7 C)  Resp: 20    Intake/Output Summary (Last 24 hours) at 08/17/13 1444 Last data filed at 08/17/13 1400  Gross per 24 hour  Intake 2166.67 ml  Output    800 ml  Net 1366.67 ml   Filed Weights   08/16/13 1923  Weight: 78.2 kg (172 lb 6.4 oz)    Exam:   General:  No distress.   Cardiovascular: S 1, S 2 RRR  Respiratory: CTA  Abdomen: bs present, soft, NT  Musculoskeletal: no edema.    Data Reviewed: Basic Metabolic Panel:  Recent Labs Lab 08/16/13 1445 08/17/13 0526  NA 136* 141  K 3.7 4.3  CL 96 102  CO2 24 26  GLUCOSE 131* 128*  BUN 7 6  CREATININE 1.18 0.88  CALCIUM 9.1 8.4   Liver Function Tests:  Recent Labs Lab 08/16/13 1445 08/17/13 0526  AST 11 8  ALT 10 9  ALKPHOS 99 88  BILITOT 0.5 0.3  PROT 7.1 6.5  ALBUMIN 3.1* 2.6*    Recent Labs Lab 08/16/13 1445  LIPASE 17   No results found for this basename: AMMONIA,  in the last 168 hours CBC:  Recent Labs Lab 08/16/13 1445 08/17/13 0526   WBC 28.7* 24.6*  NEUTROABS 24.4*  --   HGB 13.8 11.8*  HCT 40.1 35.7*  MCV 90.1 91.3  PLT 575* 567*   Cardiac Enzymes: No results found for this basename: CKTOTAL, CKMB, CKMBINDEX, TROPONINI,  in the last 168 hours BNP (last 3 results) No results found for this basename: PROBNP,  in the last 8760 hours CBG: No results found for this basename: GLUCAP,  in the last 168 hours  No results found for this or any previous visit (from the past 240 hour(s)).   Studies: No results found.  Scheduled Meds: . enoxaparin (LOVENOX) injection  40 mg Subcutaneous Q24H  . methylPREDNISolone (SOLU-MEDROL) injection  40 mg Intravenous Q12H  . sulfaSALAzine  500 mg Oral BID   Continuous Infusions: . sodium chloride 100 mL/hr at 08/17/13 0507    Active Problems:   Ulcerative colitis   Ulcerative colitis, acute   Leukocytosis, unspecified    Time spent: 30 minutes.     Kenneth Payne  [facility] Pager [phone]. If 7PM-7AM, please contact night-coverage at [email or web address], password [facility] 08/17/2013, 2:44 PM  LOS: 1 day

## 2013-08-18 ENCOUNTER — Encounter (HOSPITAL_COMMUNITY)
Admission: EM | Disposition: A | Discharge status: Home / Self Care | Payer: PRIVATE HEALTH INSURANCE | Source: Home / Self Care | Attending: Internal Medicine

## 2013-08-18 ENCOUNTER — Encounter (HOSPITAL_COMMUNITY)
Admission: EM | Disposition: A | Discharge status: Home / Self Care | Payer: Self-pay | Source: Home / Self Care | Attending: Internal Medicine

## 2013-08-18 ENCOUNTER — Encounter (HOSPITAL_COMMUNITY): Payer: Self-pay | Admitting: *Deleted

## 2013-08-18 HISTORY — PX: COLONOSCOPY: SHX5424

## 2013-08-18 LAB — CBC
HEMATOCRIT: 34 % — AB (ref 39.0–52.0)
HEMOGLOBIN: 11.1 g/dL — AB (ref 13.0–17.0)
MCH: 29.8 pg (ref 26.0–34.0)
MCHC: 32.6 g/dL (ref 30.0–36.0)
MCV: 91.4 fL (ref 78.0–100.0)
Platelets: 648 10*3/uL — ABNORMAL HIGH (ref 150–400)
RBC: 3.72 MIL/uL — ABNORMAL LOW (ref 4.22–5.81)
RDW: 13.2 % (ref 11.5–15.5)
WBC: 18.6 10*3/uL — ABNORMAL HIGH (ref 4.0–10.5)

## 2013-08-18 LAB — GI PATHOGEN PANEL BY PCR, STOOL
C difficile toxin A/B: NEGATIVE
CRYPTOSPORIDIUM BY PCR: NEGATIVE
Campylobacter by PCR: NEGATIVE
E COLI (ETEC) LT/ST: NEGATIVE
E COLI 0157 BY PCR: NEGATIVE
E coli (STEC): NEGATIVE
G lamblia by PCR: NEGATIVE
NOROVIRUS G1/G2: NEGATIVE
Rotavirus A by PCR: NEGATIVE
Salmonella by PCR: NEGATIVE
Shigella by PCR: NEGATIVE

## 2013-08-18 LAB — BASIC METABOLIC PANEL
BUN: 6 mg/dL (ref 6–23)
CO2: 25 mEq/L (ref 19–32)
CREATININE: 0.83 mg/dL (ref 0.50–1.35)
Calcium: 8.3 mg/dL — ABNORMAL LOW (ref 8.4–10.5)
Chloride: 104 mEq/L (ref 96–112)
Glucose, Bld: 134 mg/dL — ABNORMAL HIGH (ref 70–99)
Potassium: 4 mEq/L (ref 3.7–5.3)
Sodium: 139 mEq/L (ref 137–147)

## 2013-08-18 LAB — PROCALCITONIN: Procalcitonin: <0.1 ng/mL

## 2013-08-18 SURGERY — COLONOSCOPY
Anesthesia: Moderate Sedation

## 2013-08-18 MED ORDER — POLYETHYLENE GLYCOL 3350 17 G PO PACK
17.0000 g | PACK | ORAL | Status: AC
  Administered 2013-08-18 (×4): 17 g via ORAL
  Filled 2013-08-18 (×4): qty 1

## 2013-08-18 MED ORDER — MIDAZOLAM HCL 10 MG/2ML IJ SOLN
INTRAMUSCULAR | Status: AC
  Filled 2013-08-18: qty 2

## 2013-08-18 MED ORDER — FENTANYL CITRATE 0.05 MG/ML IJ SOLN
INTRAMUSCULAR | Status: AC
  Filled 2013-08-18: qty 2

## 2013-08-18 MED ORDER — SODIUM CHLORIDE 0.9 % IV SOLN
INTRAVENOUS | Status: DC
  Administered 2013-08-20: 02:00:00 via INTRAVENOUS

## 2013-08-18 MED ORDER — FENTANYL CITRATE 0.05 MG/ML IJ SOLN
INTRAMUSCULAR | Status: DC | PRN
  Administered 2013-08-18 (×5): 25 ug via INTRAVENOUS

## 2013-08-18 MED ORDER — MIDAZOLAM HCL 5 MG/5ML IJ SOLN
INTRAMUSCULAR | Status: DC | PRN
  Administered 2013-08-18 (×2): 2.5 mg via INTRAVENOUS
  Administered 2013-08-18: 2 mg via INTRAVENOUS
  Administered 2013-08-18 (×2): 2.5 mg via INTRAVENOUS

## 2013-08-18 MED ORDER — POLYETHYLENE GLYCOL 3350 17 G PO PACK
17.0000 g | PACK | Freq: Three times a day (TID) | ORAL | Status: DC
  Filled 2013-08-18 (×3): qty 1

## 2013-08-18 NOTE — Progress Notes (Signed)
[facility] PROGRESS NOTE  Kenneth Payne WEX:[id number] DOB: [birth year] DOA: 08/16/2013 PCP: No primary provider on file.  Assessment/Plan: Kenneth Payne is a 53 y.o. male with prior h/o ulcerative colitis, who presents with bloody diarrhea since 2 weeks, worsening nausea and vomiting. He was started on IV solumedrol to treat for ulcerative colitis flare. GI was consulted. Plan for colonoscopy 2-24.   1-Diarrhea, Hematochezia; Possible secondary to ulcerative colitis flare.  -Continue with 40 mg IV Solumedrol.  -Sulfasalazine.  -GI consulted.  -WBC trending down at 18.  -GI pathogen pending.  -C diff negative, stool sample done at primary gastroenterologist office.  -For colonoscopy today.   2-Leukocytosis: could be related to ulcerative colitis. Pro-calcitonin at 0.27. Trending down. Patient afebrile. Monitor. Follow GI stool pathogen. WBC has decrease to 18 from 28 on admission.   3-DVT prophylaxis; discontinue Lovenox in setting GI bleed. SCD.  4-Anemia; in setting of GI bleed. Hb stable at 11.    Code Status: Full Code.  Family Communication: Care discussed with patient.  Disposition Plan: remain inpatient.    Consultants:  [facility].   Procedures:  none  Antibiotics: none   HPI/Subjective: No more nausea. No blood in the stool this morning.  Multiple BM after prep.   Objective: Filed Vitals:   08/18/13 1409  BP:   Pulse: 90  Temp:   Resp: 17    Intake/Output Summary (Last 24 hours) at 08/18/13 1411 Last data filed at 08/18/13 1348  Gross per 24 hour  Intake   3080 ml  Output   1525 ml  Net   1555 ml   Filed Weights   08/16/13 1923  Weight: 78.2 kg (172 lb 6.4 oz)    Exam:   General:  No distress.   Cardiovascular: S 1, S 2 RRR  Respiratory: CTA  Abdomen: bs present, soft, NT  Musculoskeletal: no edema.    Data Reviewed: Basic Metabolic Panel:  Recent Labs Lab 08/16/13 1445 08/17/13 0526 08/18/13 0428  NA 136* 141 139  K  3.7 4.3 4.0  CL 96 102 104  CO2 24 26 25   GLUCOSE 131* 128* 134*  BUN 7 6 6   CREATININE 1.18 0.88 0.83  CALCIUM 9.1 8.4 8.3*   Liver Function Tests:  Recent Labs Lab 08/16/13 1445 08/17/13 0526  AST 11 8  ALT 10 9  ALKPHOS 99 88  BILITOT 0.5 0.3  PROT 7.1 6.5  ALBUMIN 3.1* 2.6*    Recent Labs Lab 08/16/13 1445  LIPASE 17   No results found for this basename: AMMONIA,  in the last 168 hours CBC:  Recent Labs Lab 08/16/13 1445 08/17/13 0526 08/18/13 0428  WBC 28.7* 24.6* 18.6*  NEUTROABS 24.4*  --   --   HGB 13.8 11.8* 11.1*  HCT 40.1 35.7* 34.0*  MCV 90.1 91.3 91.4  PLT 575* 567* 648*   Cardiac Enzymes: No results found for this basename: CKTOTAL, CKMB, CKMBINDEX, TROPONINI,  in the last 168 hours BNP (last 3 results) No results found for this basename: PROBNP,  in the last 8760 hours CBG: No results found for this basename: GLUCAP,  in the last 168 hours  No results found for this or any previous visit (from the past 240 hour(s)).   Studies: No results found.  Scheduled Meds: . methylPREDNISolone (SOLU-MEDROL) injection  40 mg Intravenous Q12H  . sulfaSALAzine  500 mg Oral BID   Continuous Infusions: . sodium chloride 100 mL/hr at 08/18/13 0957  . sodium chloride  Active Problems:   Ulcerative colitis   Ulcerative colitis, acute   Leukocytosis, unspecified    Time spent: 30 minutes.     Aldonia Keeven  [facility] Pager [phone]. If 7PM-7AM, please contact night-coverage at [email or web address], password [facility] 08/18/2013, 2:11 PM  LOS: 2 days

## 2013-08-18 NOTE — Progress Notes (Signed)
Problem: Universal ulcerative proctocolitis Proctocolonoscopy to the cecum today showed moderately severe universal ulcerative proctocolitis with a normal-appearing terminal ileum. There were no signs of pseudomembranous colitis. Three large pedunculated pseudopolyps were biopsied from the distal sigmoid colon.  Recommendations: #1. Continue intravenous Solu-Medrol 40 mg twice daily. Switched to oral prednisone 40 mg each morning prior to hospital discharge. Discharge the patient on oral prednisone 40 mg each morning with instructions to see me in the office 2 weeks post discharge to discuss prednisone taper and starting a biologic agents for maintenance therapy (Humira). #2. Apply a TB skin test #3. Check the patient's hepatitis B surface antigen, hepatitis C antibody, and hepatitis A antibody #4. Start a regular diet

## 2013-08-18 NOTE — Op Note (Signed)
Problem: Bloody diarrhea. History of ulcerative colitis. History of C. difficile colitis. C. difficile toxin negative in stool.  History: The patient is a 53 year old male born [birth year]. He has a history of chronic ulcerative colitis treated with sulfasalazine. In 2011, the patient was hospitalized with C. difficile colitis. In June 2012, the patient's colonoscopy showed moderate proctosigmoiditis.  The patient was hospitalized with nausea, vomiting, abdominal pain, and bloody diarrhea. Prior to hospitalization, his stool screen for C. difficile toxin was negative.  The patient is scheduled to undergo a diagnostic colonoscopy.  Allergies: Augmentin. Metronidazole. Rowasa.  Past medical history: Ulcerative colitis. Umbilical hernia repair. Allergic rhinitis. C. difficile colitis.  Procedure: Diagnostic colonoscopy The patient was placed in the left lateral decubitus position. Anal inspection and digital rectal exam were normal. The Pentax pediatric colonoscope was introduced into the rectum and advanced to the cecum. The ileocecal valve was intubated and the terminal ileum was inspected. Colonic preparation for the exam today was good  Proctocolonoscopy to the cecum showed moderately severe universal ulcerative proctocolitis with extremely friable mucosa, generalized mucosal ulceration unassociated with deep ulcers, and no signs of pseudomembranous colitis.  In the distal sigmoid colon, there were 3 large pedunculated inflammatory-appearing polyps which were biopsied.  Inspection of the terminal ileum was normal.  Assessment: Moderately severe universal ulcerative proctocolitis associated with 3 large pedunculated inflammatory-appearing polyps in the distal sigmoid colon which were biopsied.  Recommendation:  #1. Resume low fiber diet  #2. Continue intravenous Solu-Medrol 40 mg twice daily. Switch to oral prednisone 40 mg each morning when the patient is able to tolerate oral  medication.  #3. Discharge the patient on oral prednisone 40 mg each morning. The patient should see me approximately 2 weeks after hospital discharge to discuss prednisone taper and starting biologic agents therapy to treat universal ulcerative proctocolitis.  #4. If the patient's symptoms do not respond to intravenous Solu-Medrol promptly, perform a TB skin test and check the hepatitis B surface antigen prior to administering biologic agent therapy for universal ulcerative proctocolitis.

## 2013-08-19 ENCOUNTER — Encounter (HOSPITAL_COMMUNITY): Payer: Self-pay | Admitting: Gastroenterology

## 2013-08-19 DIAGNOSIS — D649 Anemia, unspecified: Secondary | ICD-10-CM

## 2013-08-19 LAB — BASIC METABOLIC PANEL
BUN: 9 mg/dL (ref 6–23)
CALCIUM: 8.1 mg/dL — AB (ref 8.4–10.5)
CO2: 27 mEq/L (ref 19–32)
Chloride: 105 mEq/L (ref 96–112)
Creatinine, Ser: 0.93 mg/dL (ref 0.50–1.35)
GLUCOSE: 98 mg/dL (ref 70–99)
Potassium: 3.7 mEq/L (ref 3.7–5.3)
Sodium: 140 mEq/L (ref 137–147)

## 2013-08-19 LAB — CBC
HCT: 31.7 % — ABNORMAL LOW (ref 39.0–52.0)
HEMOGLOBIN: 10.3 g/dL — AB (ref 13.0–17.0)
MCH: 29.9 pg (ref 26.0–34.0)
MCHC: 32.5 g/dL (ref 30.0–36.0)
MCV: 92.2 fL (ref 78.0–100.0)
Platelets: 634 10*3/uL — ABNORMAL HIGH (ref 150–400)
RBC: 3.44 MIL/uL — ABNORMAL LOW (ref 4.22–5.81)
RDW: 13.4 % (ref 11.5–15.5)
WBC: 17.2 10*3/uL — ABNORMAL HIGH (ref 4.0–10.5)

## 2013-08-19 NOTE — Progress Notes (Signed)
Patient ID: Kenneth Payne, male   DOB: Jul 03, 1960, 53 y.o.   MRN: [id number] [facility] Gastroenterology Progress Note  Kenneth Payne 53 y.o. [birth year]   Subjective: Sitting in chair doing work. Feels good. Reports 4 episodes of bloody diarrhea this morning and states amount of bleeding is a lot less than prior to admit. Colonoscopy showed moderate to severe pancolitis with 3 large pedunculated inflammatory polyps in the distal sigmoid colon that were biopsied per Dr. Durenda Payne note. Tolerating solid food.    Objective: Vital signs: Filed Vitals:   08/19/13 0953  BP: 128/78  Pulse: 75  Temp: 98 F (36.7 C)  Resp: 16    Physical Exam: Gen: alert, no acute distress  Abd: soft, nontender, nondistended  Lab Results:  Recent Labs  08/18/13 0428 08/19/13 0520  NA 139 140  K 4.0 3.7  CL 104 105  CO2 25 27  GLUCOSE 134* 98  BUN 6 9  CREATININE 0.83 0.93  CALCIUM 8.3* 8.1*    Recent Labs  08/16/13 1445 08/17/13 0526  AST 11 8  ALT 10 9  ALKPHOS 99 88  BILITOT 0.5 0.3  PROT 7.1 6.5  ALBUMIN 3.1* 2.6*    Recent Labs  08/16/13 1445  08/18/13 0428 08/19/13 0520  WBC 28.7*  < > 18.6* 17.2*  NEUTROABS 24.4*  --   --   --   HGB 13.8  < > 11.1* 10.3*  HCT 40.1  < > 34.0* 31.7*  MCV 90.1  < > 91.4 92.2  PLT 575*  < > 648* 634*  < > = values in this interval not displayed.    Assessment/Plan: Ulcerative colitis flare - WBC improving. Bloody diarrhea improving on IV steroids. Would continue IV steroids another day and then change to Prednisone tomorrow. Anticipate discharge tomorrow if continues to show improvement. F/U with Dr. Wynetta Payne per his note/recs yesterday.   [place] C. 08/19/2013, 10:51 AM

## 2013-08-19 NOTE — Progress Notes (Signed)
[facility] PROGRESS NOTE  Kenneth Payne Yanik VHQ:[id number] DOB: 07-17-1960 DOA: 08/16/2013 PCP: No primary provider on file.  Assessment/Plan  Ulcerative colitis flare:  Improving on IV steroids -  Continue solumedrol today -  Transition to prednisone tomorrow -  Continue sulfasalazine -  C. difficile negative -  GI pathogen panel negative -  Appreciate GI recommendations -  Followup pathology from 3 removed polyps  Leukocytosis, likely secondary to ulcerative colitis and steroid -  Repeat CBC in a.m.  Normocytic anemia, likely secondary to anemia of chronic disease however may also have some iron deficiency due to GI blood loss -  Check iron studies once this flare has improved  Diet:  Regular Access:  PIV IVF:  Off Proph:  SCDs due to ongoing active GI bleeding  Code Status: Full Family Communication: Spoke with patient alone Disposition Plan: Likely home tomorrow on prednisone   Consultants:  [facility], Dr. Michail Sermon   Procedures:  Colonoscopy 2/24   Antibiotics:  none   HPI/Subjective:  States that he is having watery BMs (looks like golytely) but he ate dinner and breakfast without difficulty.  Still having some blood in his stools.  Denies abdominal pain and nausea.    Objective: Filed Vitals:   08/19/13 0157 08/19/13 0545 08/19/13 0953 08/19/13 1406  BP: 148/90 142/86 128/78 129/74  Pulse: 77 73 75 75  Temp: 98.2 F (36.8 C) 98 F (36.7 C) 98 F (36.7 C) 98.4 F (36.9 C)  TempSrc: Oral Oral Oral Oral  Resp: 18 18 16 16   Height:      Weight:      SpO2: 99% 98% 98% 98%    Intake/Output Summary (Last 24 hours) at 08/19/13 1549 Last data filed at 08/19/13 1400  Gross per 24 hour  Intake   3300 ml  Output    850 ml  Net   2450 ml   Filed Weights   08/16/13 1923  Weight: 78.2 kg (172 lb 6.4 oz)    Exam:   General:  CM, No acute distress  HEENT:  NCAT, MMM  Cardiovascular:  RRR, nl S1, S2 no mrg, 2+ pulses, warm  extremities  Respiratory:  CTAB, no increased WOB  Abdomen:   Hyperactive BS, soft, NT/ND  MSK:   Normal tone and bulk, no LEE  Neuro:  Grossly intact  Data Reviewed: Basic Metabolic Panel:  Recent Labs Lab 08/16/13 1445 08/17/13 0526 08/18/13 0428 08/19/13 0520  NA 136* 141 139 140  K 3.7 4.3 4.0 3.7  CL 96 102 104 105  CO2 24 26 25 27   GLUCOSE 131* 128* 134* 98  BUN 7 6 6 9   CREATININE 1.18 0.88 0.83 0.93  CALCIUM 9.1 8.4 8.3* 8.1*   Liver Function Tests:  Recent Labs Lab 08/16/13 1445 08/17/13 0526  AST 11 8  ALT 10 9  ALKPHOS 99 88  BILITOT 0.5 0.3  PROT 7.1 6.5  ALBUMIN 3.1* 2.6*    Recent Labs Lab 08/16/13 1445  LIPASE 17   No results found for this basename: AMMONIA,  in the last 168 hours CBC:  Recent Labs Lab 08/16/13 1445 08/17/13 0526 08/18/13 0428 08/19/13 0520  WBC 28.7* 24.6* 18.6* 17.2*  NEUTROABS 24.4*  --   --   --   HGB 13.8 11.8* 11.1* 10.3*  HCT 40.1 35.7* 34.0* 31.7*  MCV 90.1 91.3 91.4 92.2  PLT 575* 567* 648* 634*   Cardiac Enzymes: No results found for this basename: CKTOTAL, CKMB, CKMBINDEX, TROPONINI,  in the last 168 hours BNP (last 3 results) No results found for this basename: PROBNP,  in the last 8760 hours CBG: No results found for this basename: GLUCAP,  in the last 168 hours  No results found for this or any previous visit (from the past 240 hour(s)).   Studies: No results found.  Scheduled Meds: . methylPREDNISolone (SOLU-MEDROL) injection  40 mg Intravenous Q12H  . sulfaSALAzine  500 mg Oral BID   Continuous Infusions: . sodium chloride 100 mL/hr at 08/19/13 0657  . sodium chloride      Active Problems:   Ulcerative colitis   Ulcerative colitis, acute   Leukocytosis, unspecified    Time spent: 30 min    Yaileen Hofferber, [facility] Pager [phone]. If 7PM-7AM, please contact night-coverage at [email or web address], password [facility] 08/19/2013, 3:49 PM  LOS: 3 days

## 2013-08-20 DIAGNOSIS — D75839 Thrombocytosis, unspecified: Secondary | ICD-10-CM

## 2013-08-20 DIAGNOSIS — D649 Anemia, unspecified: Secondary | ICD-10-CM

## 2013-08-20 DIAGNOSIS — D473 Essential (hemorrhagic) thrombocythemia: Secondary | ICD-10-CM

## 2013-08-20 MED ORDER — PREDNISONE 20 MG PO TABS: 40.0000 mg | ORAL_TABLET | Freq: Every day | ORAL | Status: DC

## 2013-08-20 MED ORDER — PREDNISONE 20 MG PO TABS
40.0000 mg | ORAL_TABLET | Freq: Every day | ORAL | Status: DC
Start: 2013-08-20 — End: 2013-08-20
  Administered 2013-08-20: 40 mg via ORAL
  Filled 2013-08-20 (×2): qty 2

## 2013-08-20 NOTE — Discharge Summary (Addendum)
Physician Discharge Summary  Kenneth Payne PRF:[id number] DOB: 1960/09/08 DOA: 08/16/2013  PCP: No primary provider on file.  Admit date: 08/16/2013 Discharge date: 08/20/2013  Recommendations for Outpatient Follow-up:  1. Followup with gastroenterology within 2 weeks of discharge. Please repeat CBC and BMP. Further evaluation of anemia once inflammation has improved. 2. Primary care doctor within one month of discharge or sooner as needed  Discharge Diagnoses:  Principal Problem:   Ulcerative colitis, acute Active Problems:   Leukocytosis, unspecified   Normocytic anemia   Thrombocytosis   Discharge Condition: Stable, improved  Diet recommendation: Low fiber diet  Wt Readings from Last 3 Encounters:  08/16/13 78.2 kg (172 lb 6.4 oz)  08/16/13 78.2 kg (172 lb 6.4 oz)  08/16/13 78.2 kg (172 lb 6.4 oz)    History of present illness:  Kenneth Payne is a 53 y.o. male with prior h/o ulcerative colitis, recent colonoscopy more than 2 years ago, follows up with Dr Wynetta Emery as outpatient, came in complaining of bloody diarrhea since 2 weeks. He reports steroid use 2 months ago for UC flare up. He denies the use of antibiotics he also reports nausea, vomiting and occasional abdominal cramps with diarrea. He denies fevers or chills. He denies any other complaints. He reports he saw Dr Wynetta Emery as outpatient on Thursday and he was tested for C DIFF,which was negative.  EDP called Dr Watt Climes on call for [name] recommended IV solumedrol BID and to monitor . He was referred to medical service for admission for management of UC flare up.    Hospital Course:   Ulcerative colitis flare: The patient presented with copious diarrhea with blood. Gastroenterology was consultative. His C. difficile PCR was negative, GI pathogen panel negative. He was continued on sulfasalazine. He underwent colonoscopy on February 24 which demonstrated ulcerative colitis which was more diffuse and severe than prior.   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 He should continue prednisone 40 mg daily until he follows up with gastroenterology in approximately 2 weeks. He will need to have hepatitis B and tuberculosis screening in anticipation of possible immunosuppressive medications.  His last two BMs have been without blood and are starting to become more formed.  His pathology report demonstrated hyperplastic polyps in the setting of chronic active ulcerative colitis.    Leukocytosis, likely secondary to UC flare and steroids.  UA neg and not symptoms to suggest PNA.  Repeat CBC in a few weeks.    Normocytic anemia, likely secondary to anemia of chronic disease, however he may also have some iron deficiency due to GI blood loss. Defer checking iron studies given the acute inflammation. His primary care doctor or his gastroenterologist may repeat a CBC and perform an anemia workup in a couple of weeks once this flare has improved.  Thrombocytosis, acute phase reactant.  Repeat CBC in a few weeks.    Consultants:  [facility], Dr. Michail Sermon  Procedures:  Colonoscopy 2/24  Antibiotics:  none    Discharge Exam: Filed Vitals:   08/20/13 0534  BP: 144/81  Pulse: 86  Temp: 98.1 F (36.7 C)  Resp: 16   Filed Vitals:   08/19/13 1406 08/19/13 1800 08/19/13 2040 08/20/13 0534  BP: 129/74 134/78 136/79 144/81  Pulse: 75 74 68 86  Temp: 98.4 F (36.9 C) 98 F (  36.7 C) 97.9 F (36.6 C) 98.1 F (36.7 C)  TempSrc: Oral Oral Oral Oral  Resp: 16 15 18 16   Height:      Weight:      SpO2: 98% 98% 99% 99%    General: CM, No acute distress  HEENT: NCAT, MMM  Cardiovascular: RRR, nl S1, S2 no mrg, 2+ pulses, warm extremities  Respiratory: CTAB, no increased WOB  Abdomen:  Hyperactive BS, soft, NT/ND  MSK: Normal tone and bulk, no LEE  Neuro: Grossly intact   Discharge Instructions      Discharge Orders   Future Orders Complete By Expires   Call MD for:  difficulty breathing, headache or visual disturbances  As directed    Call MD for:  extreme fatigue  As directed    Call MD for:  hives  As directed    Call MD for:  persistant dizziness or light-headedness  As directed    Call MD for:  persistant nausea and vomiting  As directed    Call MD for:  severe uncontrolled pain  As directed    Call MD for:  temperature >100.4  As directed    Diet general  As directed    Comments:     Low fiber   Discharge instructions  As directed    Comments:     You were hospitalized with an Ulcerative Colitis flare.  Please continue to take your sulfasalazine as before.  Please start taking prednisone once daily.  Read through the information about prednisone carefully as this medication has a lot of side effects.  Do not stop this medication suddenly.  Dr. Wynetta Emery can give you instructions about tapering the dose down gradually.  Please return to the hospital if you have profuse rectal bleeding, uncontrolled diarrhea and signs of dehydration, or severe abdominal pain.   Increase activity slowly  As directed        Medication List         predniSONE 20 MG tablet  Commonly known as:  DELTASONE  Take 2 tablets (40 mg total) by mouth daily with breakfast.     sulfaSALAzine 500 MG tablet  Commonly known as:  AZULFIDINE  Take 500 mg by mouth 2 (two) times daily.       Follow-up Information   Follow up with Garlan Fair, MD. Schedule an appointment as soon as possible for a visit in 2 weeks.   Specialty:  Gastroenterology   Contact information:   [address], [place] [address] [phone]        The results of significant diagnostics from this hospitalization (including imaging, microbiology, ancillary and laboratory) are listed below  for reference.    Significant Diagnostic Studies: No results found.  Microbiology: No results found for this or any previous visit (from the past 240 hour(s)).   Labs: Basic Metabolic Panel:  Recent Labs Lab 08/16/13 1445 08/17/13 0526 08/18/13 0428 08/19/13 0520  NA 136* 141 139 140  K 3.7 4.3 4.0 3.7  CL 96 102 104 105  CO2 24 26 25 27   GLUCOSE 131* 128* 134* 98  BUN 7 6 6 9   CREATININE 1.18 0.88 0.83 0.93  CALCIUM 9.1 8.4 8.3* 8.1*   Liver Function Tests:  Recent Labs Lab 08/16/13 1445 08/17/13 0526  AST 11 8  ALT 10 9  ALKPHOS 99 88  BILITOT 0.5 0.3  PROT 7.1 6.5  ALBUMIN 3.1* 2.6*    Recent Labs Lab 08/16/13 1445  LIPASE  17   No results found for this basename: AMMONIA,  in the last 168 hours CBC:  Recent Labs Lab 08/16/13 1445 08/17/13 0526 08/18/13 0428 08/19/13 0520  WBC 28.7* 24.6* 18.6* 17.2*  NEUTROABS 24.4*  --   --   --   HGB 13.8 11.8* 11.1* 10.3*  HCT 40.1 35.7* 34.0* 31.7*  MCV 90.1 91.3 91.4 92.2  PLT 575* 567* 648* 634*   Cardiac Enzymes: No results found for this basename: CKTOTAL, CKMB, CKMBINDEX, TROPONINI,  in the last 168 hours BNP: BNP (last 3 results) No results found for this basename: PROBNP,  in the last 8760 hours CBG: No results found for this basename: GLUCAP,  in the last 168 hours  Time coordinating discharge: 45 minutes  Signed:  Elio Haden  [facility] 08/20/2013, 10:53 AM

## 2014-07-29 ENCOUNTER — Other Ambulatory Visit (HOSPITAL_COMMUNITY)
Admission: RE | Admit: 2014-07-29 | Discharge: 2014-07-29 | Disposition: A | Discharge status: Home / Self Care | Payer: PRIVATE HEALTH INSURANCE | Source: Ambulatory Visit | Attending: Otolaryngology | Admitting: Otolaryngology

## 2014-07-29 ENCOUNTER — Other Ambulatory Visit: Payer: Self-pay | Admitting: Otolaryngology

## 2014-07-29 DIAGNOSIS — R221 Localized swelling, mass and lump, neck: Secondary | ICD-10-CM | POA: Diagnosis present

## 2014-08-19 ENCOUNTER — Other Ambulatory Visit: Payer: Self-pay | Admitting: Otolaryngology

## 2014-08-19 DIAGNOSIS — R591 Generalized enlarged lymph nodes: Secondary | ICD-10-CM

## 2014-08-19 DIAGNOSIS — R221 Localized swelling, mass and lump, neck: Secondary | ICD-10-CM

## 2014-09-05 ENCOUNTER — Emergency Department (HOSPITAL_BASED_OUTPATIENT_CLINIC_OR_DEPARTMENT_OTHER)
Admission: EM | Admit: 2014-09-05 | Discharge: 2014-09-05 | Disposition: A | Discharge status: Home / Self Care | Payer: PRIVATE HEALTH INSURANCE | Attending: Emergency Medicine | Admitting: Emergency Medicine

## 2014-09-05 ENCOUNTER — Encounter (HOSPITAL_BASED_OUTPATIENT_CLINIC_OR_DEPARTMENT_OTHER): Payer: Self-pay

## 2014-09-05 DIAGNOSIS — J069 Acute upper respiratory infection, unspecified: Secondary | ICD-10-CM | POA: Diagnosis not present

## 2014-09-05 DIAGNOSIS — Z8719 Personal history of other diseases of the digestive system: Secondary | ICD-10-CM | POA: Insufficient documentation

## 2014-09-05 DIAGNOSIS — R0981 Nasal congestion: Secondary | ICD-10-CM | POA: Diagnosis present

## 2014-09-05 NOTE — ED Notes (Signed)
Patient here with ongoing congestion and sinus pressure since Tuesday. Patient now coughing up green sputum. Reports this am has felt the worse

## 2014-09-05 NOTE — ED Provider Notes (Signed)
CSN: [id number]     Arrival date & time 09/05/14  1131 History   First MD Initiated Contact with Patient 09/05/14 1152     Chief Complaint  Patient presents with  . Nasal Congestion      HPI   Patient reevaluation of her runny nose and some sinus congestion. Has a cough productive of green sputum. No shortness of breath. No fever. No body aches. No GI complaints.  Past Medical History  Diagnosis Date  . Ulcerative colitis    Past Surgical History  Procedure Laterality Date  . Hernia repair      2009  . Colonoscopy N/A 08/18/2013    Procedure: COLONOSCOPY;  Surgeon: Winfield Cunas., MD;  Location: [facility];  Service: Endoscopy;  Laterality: N/A;   No family history on file. History  Substance Use Topics  . Smoking status: Never Smoker   . Smokeless tobacco: Not on file  . Alcohol Use: No    Review of Systems  Constitutional: Negative for fever, chills, diaphoresis, appetite change and fatigue.  HENT: Positive for congestion and rhinorrhea. Negative for mouth sores, sore throat and trouble swallowing.   Eyes: Negative for visual disturbance.  Respiratory: Negative for cough, chest tightness, shortness of breath and wheezing.   Cardiovascular: Negative for chest pain.  Gastrointestinal: Negative for nausea, vomiting, abdominal pain, diarrhea and abdominal distention.  Endocrine: Negative for polydipsia, polyphagia and polyuria.  Genitourinary: Negative for dysuria, frequency and hematuria.  Musculoskeletal: Negative for gait problem.  Skin: Negative for color change, pallor and rash.  Neurological: Negative for dizziness, syncope, light-headedness and headaches.  Hematological: Does not bruise/bleed easily.  Psychiatric/Behavioral: Negative for behavioral problems and confusion.      Allergies  Augmentin and Flagyl  Home Medications   Prior to Admission medications   Medication Sig Start Date End Date Taking? Authorizing Provider  predniSONE (DELTASONE)  20 MG tablet Take 2 tablets (40 mg total) by mouth daily with breakfast. Patient taking differently: Take 20 mg by mouth daily with breakfast.  08/20/13   Janece Canterbury, MD  sulfaSALAzine (AZULFIDINE) 500 MG tablet Take 500 mg by mouth 2 (two) times daily.    Historical Provider, MD   BP 153/81 mmHg  Pulse 94  Temp(Src) 98.2 F (36.8 C) (Oral)  Resp 18  Wt 185 lb (83.915 kg)  SpO2 95% Physical Exam  Constitutional: He is oriented to person, place, and time. He appears well-developed and well-nourished. No distress.  HENT:  Head: Normocephalic.  Eyes: Conjunctivae are normal. Pupils are equal, round, and reactive to light. No scleral icterus.  Neck: Normal range of motion. Neck supple. No thyromegaly present.  Cardiovascular: Normal rate and regular rhythm.  Exam reveals no gallop and no friction rub.   No murmur heard. Pulmonary/Chest: Effort normal and breath sounds normal. No respiratory distress. He has no wheezes. He has no rales.  Abdominal: Soft. Bowel sounds are normal. He exhibits no distension. There is no tenderness. There is no rebound.  Musculoskeletal: Normal range of motion.  Neurological: He is alert and oriented to person, place, and time.  Skin: Skin is warm and dry. No rash noted.  Psychiatric: He has a normal mood and affect. His behavior is normal.    ED Course  Procedures (including critical care time) Labs Review Labs Reviewed - No data to display  Imaging Review No results found.   EKG Interpretation None      MDM   Final diagnoses:  URI (upper respiratory infection)  Normal exam.  Not febrile or hypoxemic.  Plan is OTC meds.    Tanna Furry, MD 09/05/14 [phone]

## 2014-09-05 NOTE — Discharge Instructions (Signed)
Mucinex and or Robitussin as needed.  Upper Respiratory Infection, Adult An upper respiratory infection (URI) is also sometimes known as the common cold. The upper respiratory tract includes the nose, sinuses, throat, trachea, and bronchi. Bronchi are the airways leading to the lungs. Most people improve within 1 week, but symptoms can last up to 2 weeks. A residual cough may last even longer.  CAUSES Many different viruses can infect the tissues lining the upper respiratory tract. The tissues become irritated and inflamed and often become very moist. Mucus production is also common. A cold is contagious. You can easily spread the virus to others by oral contact. This includes kissing, sharing a glass, coughing, or sneezing. Touching your mouth or nose and then touching a surface, which is then touched by another person, can also spread the virus. SYMPTOMS  Symptoms typically develop 1 to 3 days after you come in contact with a cold virus. Symptoms vary from person to person. They may include:  Runny nose.  Sneezing.  Nasal congestion.  Sinus irritation.  Sore throat.  Loss of voice (laryngitis).  Cough.  Fatigue.  Muscle aches.  Loss of appetite.  Headache.  Low-grade fever. DIAGNOSIS  You might diagnose your own cold based on familiar symptoms, since most people get a cold 2 to 3 times a year. Your caregiver can confirm this based on your exam. Most importantly, your caregiver can check that your symptoms are not due to another disease such as strep throat, sinusitis, pneumonia, asthma, or epiglottitis. Blood tests, throat tests, and X-rays are not necessary to diagnose a common cold, but they may sometimes be helpful in excluding other more serious diseases. Your caregiver will decide if any further tests are required. RISKS AND COMPLICATIONS  You may be at risk for a more severe case of the common cold if you smoke cigarettes, have chronic heart disease (such as heart failure)  or lung disease (such as asthma), or if you have a weakened immune system. The very young and very old are also at risk for more serious infections. Bacterial sinusitis, middle ear infections, and bacterial pneumonia can complicate the common cold. The common cold can worsen asthma and chronic obstructive pulmonary disease (COPD). Sometimes, these complications can require emergency medical care and may be life-threatening. PREVENTION  The best way to protect against getting a cold is to practice good hygiene. Avoid oral or hand contact with people with cold symptoms. Wash your hands often if contact occurs. There is no clear evidence that vitamin C, vitamin E, echinacea, or exercise reduces the chance of developing a cold. However, it is always recommended to get plenty of rest and practice good nutrition. TREATMENT  Treatment is directed at relieving symptoms. There is no cure. Antibiotics are not effective, because the infection is caused by a virus, not by bacteria. Treatment may include:  Increased fluid intake. Sports drinks offer valuable electrolytes, sugars, and fluids.  Breathing heated mist or steam (vaporizer or shower).  Eating chicken soup or other clear broths, and maintaining good nutrition.  Getting plenty of rest.  Using gargles or lozenges for comfort.  Controlling fevers with ibuprofen or acetaminophen as directed by your caregiver.  Increasing usage of your inhaler if you have asthma. Zinc gel and zinc lozenges, taken in the first 24 hours of the common cold, can shorten the duration and lessen the severity of symptoms. Pain medicines may help with fever, muscle aches, and throat pain. A variety of non-prescription medicines are available to  treat congestion and runny nose. Your caregiver can make recommendations and may suggest nasal or lung inhalers for other symptoms.  HOME CARE INSTRUCTIONS   Only take over-the-counter or prescription medicines for pain, discomfort, or  fever as directed by your caregiver.  Use a warm mist humidifier or inhale steam from a shower to increase air moisture. This may keep secretions moist and make it easier to breathe.  Drink enough water and fluids to keep your urine clear or pale yellow.  Rest as needed.  Return to work when your temperature has returned to normal or as your caregiver advises. You may need to stay home longer to avoid infecting others. You can also use a face mask and careful hand washing to prevent spread of the virus. SEEK MEDICAL CARE IF:   After the first few days, you feel you are getting worse rather than better.  You need your caregiver's advice about medicines to control symptoms.  You develop chills, worsening shortness of breath, or brown or red sputum. These may be signs of pneumonia.  You develop yellow or brown nasal discharge or pain in the face, especially when you bend forward. These may be signs of sinusitis.  You develop a fever, swollen neck glands, pain with swallowing, or white areas in the back of your throat. These may be signs of strep throat. SEEK IMMEDIATE MEDICAL CARE IF:   You have a fever.  You develop severe or persistent headache, ear pain, sinus pain, or chest pain.  You develop wheezing, a prolonged cough, cough up blood, or have a change in your usual mucus (if you have chronic lung disease).  You develop sore muscles or a stiff neck. Document Released: 12/05/2000 Document Revised: 09/03/2011 Document Reviewed: 09/16/2013 [facility] Patient Information 2015 [place], [place]. This information is not intended to replace advice given to you by your health care provider. Make sure you discuss any questions you have with your health care provider.

## 2014-10-04 ENCOUNTER — Encounter (HOSPITAL_BASED_OUTPATIENT_CLINIC_OR_DEPARTMENT_OTHER): Payer: Self-pay | Admitting: *Deleted

## 2014-10-04 ENCOUNTER — Emergency Department (HOSPITAL_BASED_OUTPATIENT_CLINIC_OR_DEPARTMENT_OTHER)
Admission: EM | Admit: 2014-10-04 | Discharge: 2014-10-04 | Disposition: A | Discharge status: Home / Self Care | Payer: PRIVATE HEALTH INSURANCE | Attending: Emergency Medicine | Admitting: Emergency Medicine

## 2014-10-04 DIAGNOSIS — Z79899 Other long term (current) drug therapy: Secondary | ICD-10-CM | POA: Diagnosis not present

## 2014-10-04 DIAGNOSIS — R05 Cough: Secondary | ICD-10-CM

## 2014-10-04 DIAGNOSIS — Z7952 Long term (current) use of systemic steroids: Secondary | ICD-10-CM | POA: Insufficient documentation

## 2014-10-04 DIAGNOSIS — Z8719 Personal history of other diseases of the digestive system: Secondary | ICD-10-CM | POA: Diagnosis not present

## 2014-10-04 DIAGNOSIS — J0110 Acute frontal sinusitis, unspecified: Secondary | ICD-10-CM | POA: Diagnosis not present

## 2014-10-04 DIAGNOSIS — R059 Cough, unspecified: Secondary | ICD-10-CM

## 2014-10-04 MED ORDER — DEXTROMETHORPHAN HBR 15 MG/5ML PO SYRP: 10.0000 mL | ORAL_SOLUTION | Freq: Four times a day (QID) | ORAL | Status: DC | PRN

## 2014-10-04 MED ORDER — DOXYCYCLINE HYCLATE 100 MG PO CAPS: 100.0000 mg | ORAL_CAPSULE | Freq: Two times a day (BID) | ORAL | Status: DC

## 2014-10-04 MED ORDER — FLUTICASONE PROPIONATE 50 MCG/ACT NA SUSP: 2.0000 | Freq: Every day | NASAL | Status: DC

## 2014-10-04 NOTE — ED Provider Notes (Signed)
CSN: [id number]     Arrival date & time 10/04/14  1216 History   First MD Initiated Contact with Patient 10/04/14 1255     Chief Complaint  Patient presents with  . Recurrent Sinusitis     (Consider location/radiation/quality/duration/timing/severity/associated sxs/prior Treatment) HPI  Pt is a 54yo male presenting to ED with c/o 3-4 week hx of gradually worsening nasal congestion and pressure with associated frontal headache and cough.  Headache is aching and sore, 2/10.  Reports taking ibuprofen to help with the headache.  Denies n/v/d. Reports subjective fever with hot and cold chills. Reports being seen 09/05/14 for similar symptoms but was advised to rest as antibiotics were not indicated at that time but pt states he believes he has a sinus infection now.   Pt does have a PCP but states they were too busy for him to get into today. No sick contacts or recent travel. Denies CP or SOB. No hx of asthma.   Past Medical History  Diagnosis Date  . Ulcerative colitis    Past Surgical History  Procedure Laterality Date  . Hernia repair      2009  . Colonoscopy N/A 08/18/2013    Procedure: COLONOSCOPY;  Surgeon: Winfield Cunas., MD;  Location: [facility];  Service: Endoscopy;  Laterality: N/A;   No family history on file. History  Substance Use Topics  . Smoking status: Never Smoker   . Smokeless tobacco: Not on file  . Alcohol Use: No    Review of Systems  Constitutional: Positive for fever ( subjective). Negative for chills and appetite change.  HENT: Positive for congestion, rhinorrhea and sinus pressure. Negative for ear discharge, ear pain, facial swelling, sore throat, trouble swallowing and voice change.   Respiratory: Positive for cough. Negative for shortness of breath, wheezing and stridor.   Cardiovascular: Negative for chest pain and palpitations.  Gastrointestinal: Negative for nausea, vomiting, abdominal pain and diarrhea.  Musculoskeletal: Positive for myalgias.  Negative for back pain, arthralgias, neck pain and neck stiffness.  Neurological: Positive for headaches. Negative for dizziness and light-headedness.  All other systems reviewed and are negative.     Allergies  Augmentin and Flagyl  Home Medications   Prior to Admission medications   Medication Sig Start Date End Date Taking? Authorizing Provider  dextromethorphan 15 MG/5ML syrup Take 10 mLs (30 mg total) by mouth 4 (four) times daily as needed for cough. 10/04/14   Noland Fordyce, PA-C  doxycycline (VIBRAMYCIN) 100 MG capsule Take 1 capsule (100 mg total) by mouth 2 (two) times daily. One po bid x 7 days 10/04/14   Noland Fordyce, PA-C  fluticasone [facility]) 50 MCG/ACT nasal spray Place 2 sprays into both nostrils daily. 10/04/14   Noland Fordyce, PA-C  predniSONE (DELTASONE) 20 MG tablet Take 2 tablets (40 mg total) by mouth daily with breakfast. Patient taking differently: Take 20 mg by mouth daily with breakfast.  08/20/13   Janece Canterbury, MD  sulfaSALAzine (AZULFIDINE) 500 MG tablet Take 500 mg by mouth 2 (two) times daily.    Historical Provider, MD   BP 136/99 mmHg  Pulse 94  Temp(Src) 98 F (36.7 C) (Oral)  Resp 18  Ht 5' 7"  (1.702 m)  Wt 185 lb (83.915 kg)  BMI 28.97 kg/m2  SpO2 99% Physical Exam  Constitutional: He appears well-developed and well-nourished.  HENT:  Head: Normocephalic and atraumatic.  Right Ear: Hearing, tympanic membrane, external ear and ear canal normal.  Left Ear: Hearing, tympanic membrane, external ear  and ear canal normal.  Nose: Mucosal edema and rhinorrhea present. Right sinus exhibits no maxillary sinus tenderness and no frontal sinus tenderness. Left sinus exhibits frontal sinus tenderness. Left sinus exhibits no maxillary sinus tenderness.  Eyes: Conjunctivae are normal. No scleral icterus.  Neck: Normal range of motion. Neck supple. No tracheal deviation present.  Cardiovascular: Normal rate, regular rhythm and normal heart sounds.    Pulmonary/Chest: Effort normal and breath sounds normal. No stridor. No respiratory distress. He has no wheezes. He has no rales. He exhibits no tenderness.  Abdominal: Soft. Bowel sounds are normal. He exhibits no distension and no mass. There is no tenderness. There is no rebound and no guarding.  Musculoskeletal: Normal range of motion.  Neurological: He is alert.  Skin: Skin is warm and dry.  Nursing note and vitals reviewed.   ED Course  Procedures (including critical care time) Labs Review Labs Reviewed - No data to display  Imaging Review No results found.   EKG Interpretation None      MDM   Final diagnoses:  Acute frontal sinusitis, recurrence not specified  Cough    Pt c/o 3-4 week hx of sinus congestion with associated cough and frontal headache. Pt is tender in left frontal sinus.  Due to duration of symptoms, nasal congestion with frontal sinus tenderness on exam, will tx for acute sinusitis. Pt states he has had bloody diarrhea with augmentin in the past, will tx with doxycycline. Home care instructions provided. F/u with c/o within 1 week for recheck of symptoms. Return precautions provided. Pt verbalized understanding and agreement with tx plan.      Noland Fordyce, PA-C 10/04/14 [place], MD 10/05/14 1431

## 2014-10-04 NOTE — ED Notes (Signed)
Feels like he has a sinus infection. Hx of same.

## 2014-11-29 ENCOUNTER — Ambulatory Visit
Admission: RE | Admit: 2014-11-29 | Discharge: 2014-11-29 | Disposition: A | Discharge status: Home / Self Care | Payer: PRIVATE HEALTH INSURANCE | Source: Ambulatory Visit | Attending: Otolaryngology | Admitting: Otolaryngology

## 2014-11-29 DIAGNOSIS — R221 Localized swelling, mass and lump, neck: Secondary | ICD-10-CM

## 2014-12-16 ENCOUNTER — Other Ambulatory Visit (HOSPITAL_COMMUNITY): Payer: Self-pay | Admitting: Otolaryngology

## 2014-12-16 DIAGNOSIS — R221 Localized swelling, mass and lump, neck: Secondary | ICD-10-CM

## 2014-12-30 ENCOUNTER — Other Ambulatory Visit: Payer: Self-pay | Admitting: Radiology

## 2015-01-03 ENCOUNTER — Encounter (HOSPITAL_COMMUNITY): Payer: Self-pay

## 2015-01-03 ENCOUNTER — Ambulatory Visit (HOSPITAL_COMMUNITY)
Admission: RE | Admit: 2015-01-03 | Discharge: 2015-01-03 | Disposition: A | Discharge status: Home / Self Care | Payer: PRIVATE HEALTH INSURANCE | Source: Ambulatory Visit | Attending: Otolaryngology | Admitting: Otolaryngology

## 2015-01-03 DIAGNOSIS — R221 Localized swelling, mass and lump, neck: Secondary | ICD-10-CM | POA: Diagnosis present

## 2015-01-03 DIAGNOSIS — K5190 Ulcerative colitis, unspecified, without complications: Secondary | ICD-10-CM | POA: Diagnosis not present

## 2015-01-03 DIAGNOSIS — Z79899 Other long term (current) drug therapy: Secondary | ICD-10-CM | POA: Diagnosis not present

## 2015-01-03 LAB — CBC
HEMATOCRIT: 39.8 % (ref 39.0–52.0)
HEMOGLOBIN: 12.8 g/dL — AB (ref 13.0–17.0)
MCH: 26.3 pg (ref 26.0–34.0)
MCHC: 32.2 g/dL (ref 30.0–36.0)
MCV: 81.9 fL (ref 78.0–100.0)
Platelets: 363 10*3/uL (ref 150–400)
RBC: 4.86 MIL/uL (ref 4.22–5.81)
RDW: 16 % — AB (ref 11.5–15.5)
WBC: 8.8 10*3/uL (ref 4.0–10.5)

## 2015-01-03 LAB — APTT: APTT: 34 s (ref 24–37)

## 2015-01-03 LAB — PROTIME-INR
INR: 1.12 (ref 0.00–1.49)
Prothrombin Time: 14.5 seconds (ref 11.6–15.2)

## 2015-01-03 MED ORDER — FENTANYL CITRATE (PF) 100 MCG/2ML IJ SOLN
INTRAMUSCULAR | Status: AC
  Filled 2015-01-03: qty 2

## 2015-01-03 MED ORDER — MIDAZOLAM HCL 2 MG/2ML IJ SOLN
INTRAMUSCULAR | Status: AC | PRN
  Administered 2015-01-03: 1 mg via INTRAVENOUS

## 2015-01-03 MED ORDER — SODIUM CHLORIDE 0.9 % IV SOLN
Freq: Once | INTRAVENOUS | Status: AC
  Administered 2015-01-03: 09:00:00 via INTRAVENOUS

## 2015-01-03 MED ORDER — MIDAZOLAM HCL 2 MG/2ML IJ SOLN
INTRAMUSCULAR | Status: AC
  Filled 2015-01-03: qty 2

## 2015-01-03 MED ORDER — FENTANYL CITRATE (PF) 100 MCG/2ML IJ SOLN
INTRAMUSCULAR | Status: AC | PRN
  Administered 2015-01-03: 50 ug via INTRAVENOUS

## 2015-01-03 MED ORDER — LIDOCAINE HCL (PF) 1 % IJ SOLN
INTRAMUSCULAR | Status: AC
  Filled 2015-01-03: qty 10

## 2015-01-03 NOTE — H&P (Signed)
Chief Complaint:  Rt neck mass  Referring Physician(s): Byers,John  History of Present Illness: Kenneth Payne is a 54 y.o. male   Pt noticed Rt neck "lymph node" enlargement early 2016 Dr Janace Hoard aspirated in office 07/2014---insufficient Continues to be enlarged Pt feels might be bigger Now scheduled for US guided biopsy  Past Medical History  Diagnosis Date  . Ulcerative colitis     Past Surgical History  Procedure Laterality Date  . Hernia repair      2009  . Colonoscopy N/A 08/18/2013    Procedure: COLONOSCOPY;  Surgeon: Winfield Cunas., MD;  Location: [facility];  Service: Endoscopy;  Laterality: N/A;    Allergies: Augmentin and Flagyl  Medications: Prior to Admission medications   Medication Sig Start Date End Date Taking? Authorizing Provider  Adalimumab (HUMIRA PEN) 40 MG/0.8ML PNKT Inject 40 mg into the skin every 14 (fourteen) days. Next injection 01/01/15   Yes Historical Provider, MD  predniSONE (DELTASONE) 10 MG tablet Take 5-40 mg by mouth daily with breakfast. Started 10/29/14: Take 4 tablets (40 mg) every morning for 2 weeks, then decrease by 1/2 tablet each week until finished 10/29/14  Yes Historical Provider, MD  fluticasone (FLONASE) 50 MCG/ACT nasal spray Place 2 sprays into both nostrils daily. Patient not taking: Reported on 12/31/2014 10/04/14   Noland Fordyce, PA-C  predniSONE (DELTASONE) 20 MG tablet Take 2 tablets (40 mg total) by mouth daily with breakfast. Patient not taking: Reported on 12/31/2014 08/20/13   Janece Canterbury, MD     History reviewed. No pertinent family history.  History   Social History  . Marital Status: Married    Spouse Name: N/A  . Number of Children: N/A  . Years of Education: N/A   Social History Main Topics  . Smoking status: Never Smoker   . Smokeless tobacco: Not on file  . Alcohol Use: No  . Drug Use: No  . Sexual Activity: Not on file   Other Topics Concern  . None   Social History Narrative     Review of Systems: A 12 point ROS discussed and pertinent positives are indicated in the HPI above.  All other systems are negative.  Review of Systems  Constitutional: Negative for fever and activity change.  HENT: Negative for sore throat and trouble swallowing.   Psychiatric/Behavioral: Negative for behavioral problems and confusion.    Vital Signs: BP 156/91 mmHg  Pulse 91  Temp(Src) 98.3 F (36.8 C)  Resp 20  Ht 5' 7"  (1.702 m)  Wt 180 lb (81.647 kg)  BMI 28.19 kg/m2  Physical Exam  Constitutional: He is oriented to person, place, and time.  Neck: Normal range of motion. Neck supple.  Cardiovascular: Normal rate, regular rhythm and normal heart sounds.   No murmur heard. Pulmonary/Chest: Effort normal and breath sounds normal. He has no wheezes.  Abdominal: Soft. Bowel sounds are normal.  Musculoskeletal: Normal range of motion.  Neurological: He is alert and oriented to person, place, and time.  Skin: Skin is warm and dry.  Psychiatric: He has a normal mood and affect. His behavior is normal. Judgment and thought content normal.  Nursing note and vitals reviewed.   Mallampati Score:  MD Evaluation Airway: WNL Heart: WNL Abdomen: WNL Chest/ Lungs: WNL ASA  Classification: 2 Mallampati/Airway Score: One  Imaging: No results found.  Labs:  CBC:  Recent Labs  01/03/15 0900  WBC 8.8  HGB 12.8*  HCT 39.8  PLT 363  COAGS:  Recent Labs  01/03/15 0900  INR 1.12  APTT 34    BMP: No results for input(s): NA, K, CL, CO2, GLUCOSE, BUN, CALCIUM, CREATININE, GFRNONAA, GFRAA in the last 8760 hours.  Invalid input(s): CMP  LIVER FUNCTION TESTS: No results for input(s): BILITOT, AST, ALT, ALKPHOS, PROT, ALBUMIN in the last 8760 hours.  TUMOR MARKERS: No results for input(s): AFPTM, CEA, CA199, CHROMGRNA in the last 8760 hours.  Assessment and Plan:  Rt neck LAN noted since early 2016 Bx performed in office with Dr Verlin Dike Now for bx  in Rad using US guidance Risks and Benefits discussed with the patient including, but not limited to bleeding, infection, damage to adjacent structures or low yield requiring additional tests. All of the patient's questions were answered, patient is agreeable to proceed. Consent signed and in chart.    Thank you for this interesting consult.  I greatly enjoyed meeting Kenneth Payne and look forward to participating in their care.  Signed: Shyasia Funches A 01/03/2015, 9:32 AM   I spent a total of  30 Minutes   in face to face in clinical consultation, greater than 50% of which was counseling/coordinating care for Rt neck mass bx

## 2015-01-03 NOTE — Sedation Documentation (Signed)
Patient is resting comfortably. 

## 2015-01-03 NOTE — Procedures (Signed)
Procedure:  Ultrasound guided right cervical lymph node biopsy Findings:  Posterior right cervical LN core biopsy performed with 18 G device.  Multiple core samples submitted in saline. No complications.

## 2015-01-03 NOTE — Discharge Instructions (Signed)
Biopsy Discharge Instructions  The procedure you just had is called a biopsy.  You may feel some discomfort after the local anesthetic wears off.  Your discomfort should improve over the next several days.  AFTER YOUR BIOPSY  Rest for the remainder of the day.  Avoid heavy lifting (more than 10 lb/4.5 kg).  If you have been given a general anesthetic or other medications to help you relax, you should not operate machinery, drive or make legal decisions for 24 hours after your procedure.  Additionally, someone must be available to drive you home.  Only take over-the-counter or prescription medicines for pain, discomfort, or fever as directed by your caregiver.  This can make bleeding worse.  You may resume your usual diet after the procedure.  Avoid alcoholic beverages for 24 hours after your procedure.  Keep the skin around your biopsy site clean and dry.  You may shower after 24 hours.  Cleanse and dry the biopsy site completely after you shower.  Avoid baths and swimming for 72 hours.  Complications are very uncommon after this procedure.  Go to the nearest Emergency Department or contact your caregiver if you develop any of the following symptoms:  Worsening pain  Bleeding  Swelling at the biopsy site  Light headedness or dizziness  Shortness of Breath  Fever or chills  Redness or increased pain or swelling at the biopsy site

## 2015-01-23 ENCOUNTER — Emergency Department (HOSPITAL_BASED_OUTPATIENT_CLINIC_OR_DEPARTMENT_OTHER)
Admission: EM | Admit: 2015-01-23 | Discharge: 2015-01-23 | Disposition: A | Discharge status: Home / Self Care | Payer: PRIVATE HEALTH INSURANCE | Attending: Emergency Medicine | Admitting: Emergency Medicine

## 2015-01-23 ENCOUNTER — Encounter (HOSPITAL_BASED_OUTPATIENT_CLINIC_OR_DEPARTMENT_OTHER): Payer: Self-pay | Admitting: Emergency Medicine

## 2015-01-23 DIAGNOSIS — Z8719 Personal history of other diseases of the digestive system: Secondary | ICD-10-CM | POA: Insufficient documentation

## 2015-01-23 DIAGNOSIS — Z7952 Long term (current) use of systemic steroids: Secondary | ICD-10-CM | POA: Diagnosis not present

## 2015-01-23 DIAGNOSIS — Z880 Allergy status to penicillin: Secondary | ICD-10-CM | POA: Diagnosis not present

## 2015-01-23 DIAGNOSIS — L03213 Periorbital cellulitis: Secondary | ICD-10-CM

## 2015-01-23 DIAGNOSIS — R220 Localized swelling, mass and lump, head: Secondary | ICD-10-CM | POA: Diagnosis present

## 2015-01-23 DIAGNOSIS — H05011 Cellulitis of right orbit: Secondary | ICD-10-CM | POA: Diagnosis not present

## 2015-01-23 MED ORDER — GENTAMICIN SULFATE 0.3 % OP SOLN: 1.0000 [drp] | OPHTHALMIC | Status: DC

## 2015-01-23 MED ORDER — CEPHALEXIN 500 MG PO CAPS: 500.0000 mg | ORAL_CAPSULE | Freq: Four times a day (QID) | ORAL | Status: DC

## 2015-01-23 MED ORDER — CEFTRIAXONE SODIUM 1 G IJ SOLR
1.0000 g | Freq: Once | INTRAMUSCULAR | Status: AC
  Administered 2015-01-23: 1 g via INTRAMUSCULAR
  Filled 2015-01-23: qty 10

## 2015-01-23 MED ORDER — LIDOCAINE HCL (PF) 1 % IJ SOLN
INTRAMUSCULAR | Status: AC
  Administered 2015-01-23: 5 mL
  Filled 2015-01-23: qty 5

## 2015-01-23 NOTE — ED Notes (Signed)
Right eye redness swelling and discharge x 24 hours woke this am with discharge and increased swelling

## 2015-01-23 NOTE — Discharge Instructions (Signed)
Periorbital Cellulitis °Periorbital cellulitis is a common infection that can affect the eyelid and the soft tissues that surround the eyeball. The infection may also affect the structures that produce and drain tears. It does not affect the eyeball itself. Natural tissue barriers usually prevent the spread of this infection to the eyeball and other deeper areas of the eye socket.      °CAUSES °· Bacterial infection. °· Long-term (chronic) sinus infections. °· An object (foreign body) stuck behind the eye. °· An injury that goes through the eyelid tissues. °· An injury that causes an infection, such as an insect sting. °· Fracture of the bone around the eye. °· Infections which have spread from the eyelid or other structures around the eye. °· Bite wounds. °· Inflammation or infection of the lining membranes of the brain (meningitis). °· An infection in the blood (septicemia). °· Dental infection (abscess). °· Viral infection (this is rare). °SYMPTOMS °Symptoms usually come on suddenly. °· Pain in the eye. °· Red, hot, and swollen eyelids and possibly cheeks. The swelling is sometimes bad enough that the eyelids cannot open. Some infections make the eyelids look purple. °· Fever and feeling generally ill. °· Pain when touching the area around the eye. °DIAGNOSIS  °Periorbital cellulitis can be diagnosed from an eye exam. In severe cases, your caregiver might suggest: °· Blood tests. °· Imaging tests (such as a CT scan) to examine the sinuses and the area around and behind the eyeball. °TREATMENT °If your caregiver feels that you do not have any signs of serious infection, treatment may include: °· Antibiotics. °· Nasal decongestants to reduce swelling. °· Referral to a dentist if it is suspected that the infection was caused by a prior tooth infection. °· Examination every day to make sure the problem is improving. °HOME CARE INSTRUCTIONS °· Take your antibiotics as directed. Finish them even if you start to feel  better. °· Some pain is normal with this condition. Take pain medicine as directed by your caregiver. Only take pain medicines approved by your caregiver. °· It is important to drink fluids. Drink enough water and fluids to keep your urine clear or pale yellow. °· Do not smoke. °· Rest and get plenty of sleep. °· Mild or moderate fevers generally have no long-term effects and often do not require treatment. °· If your caregiver has given you a follow-up appointment, it is very important to keep that appointment. Your caregiver will need to make sure that the infection is getting better. It is important to check that a more serious infection is not developing. °SEEK IMMEDIATE MEDICAL CARE IF: °· Your eyelids become more painful, red, warm, or swollen. °· You develop double vision or your vision becomes blurred or worsens in any way. °· You have trouble moving your eyes. °· The eye looks like it is popping out (proptosis). °· You develop a severe headache, severe neck pain, or neck stiffness. °· You develop repeated vomiting. °· You have a fever or persistent symptoms for more than 72 hours. °· You have a fever and your symptoms suddenly get worse. °MAKE SURE YOU: °· Understand these instructions. °· Will watch your condition. °· Will get help right away if you are not doing well or get worse. °Document Released: 07/14/2010 Document Revised: 09/03/2011 Document Reviewed: 07/14/2010 °[employer]® Patient Information ©2015 [employer]. This information is not intended to replace advice given to you by your health care provider. Make sure you discuss any questions you have with your health care provider. ° °

## 2015-01-26 NOTE — ED Provider Notes (Signed)
CSN: [id number]     Arrival date & time 01/23/15  1126 History   First MD Initiated Contact with Patient 01/23/15 1153     Chief Complaint  Patient presents with  . Facial Swelling  . Eye Problem    HPI  She presents for evaluation of right eye redness and pain. States he's had symptoms for 24 hours after waking with this yesterday. Her fevers or chills. Normal vision. Noticed some swelling into his lower lid today and presents for evaluation.  Past Medical History  Diagnosis Date  . Ulcerative colitis    Past Surgical History  Procedure Laterality Date  . Hernia repair      2009  . Colonoscopy N/A 08/18/2013    Procedure: COLONOSCOPY;  Surgeon: Winfield Cunas., MD;  Location: [facility];  Service: Endoscopy;  Laterality: N/A;   History reviewed. No pertinent family history. History  Substance Use Topics  . Smoking status: Never Smoker   . Smokeless tobacco: Not on file  . Alcohol Use: Yes     Comment: 2 beers a week     Review of Systems  Constitutional: Negative for fever, chills, diaphoresis, appetite change and fatigue.  HENT: Negative for mouth sores, sore throat and trouble swallowing.   Eyes: Positive for pain and redness. Negative for visual disturbance.       No direct globe pain. His discomfort and swelling inferior to the lid. No dermatomal pain or rash or vesicles.  Respiratory: Negative for cough, chest tightness, shortness of breath and wheezing.   Cardiovascular: Negative for chest pain.  Gastrointestinal: Negative for nausea, vomiting, abdominal pain, diarrhea and abdominal distention.  Endocrine: Negative for polydipsia, polyphagia and polyuria.  Genitourinary: Negative for dysuria, frequency and hematuria.  Musculoskeletal: Negative for gait problem.  Skin: Negative for color change, pallor and rash.  Neurological: Negative for dizziness, syncope, light-headedness and headaches.  Hematological: Does not bruise/bleed easily.  Psychiatric/Behavioral:  Negative for behavioral problems and confusion.      Allergies  Augmentin and Flagyl  Home Medications   Prior to Admission medications   Medication Sig Start Date End Date Taking? Authorizing Provider  Adalimumab (HUMIRA PEN) 40 MG/0.8ML PNKT Inject 40 mg into the skin every 14 (fourteen) days. Next injection 01/01/15   Yes Historical Provider, MD  cephALEXin (KEFLEX) 500 MG capsule Take 1 capsule (500 mg total) by mouth 4 (four) times daily. 01/23/15   Tanna Furry, MD  fluticasone (FLONASE) 50 MCG/ACT nasal spray Place 2 sprays into both nostrils daily. Patient not taking: Reported on 12/31/2014 10/04/14   Noland Fordyce, PA-C  gentamicin (GARAMYCIN) 0.3 % ophthalmic solution Place 1 drop into the left eye every 4 (four) hours. 01/23/15   Tanna Furry, MD  predniSONE (DELTASONE) 10 MG tablet Take 5-40 mg by mouth daily with breakfast. Started 10/29/14: Take 4 tablets (40 mg) every morning for 2 weeks, then decrease by 1/2 tablet each week until finished 10/29/14   Historical Provider, MD  predniSONE (DELTASONE) 20 MG tablet Take 2 tablets (40 mg total) by mouth daily with breakfast. Patient not taking: Reported on 12/31/2014 08/20/13   Janece Canterbury, MD   BP 148/91 mmHg  Pulse 77  Temp(Src) 98.3 F (36.8 C) (Oral)  Resp 16  Ht 5' 7"  (1.702 m)  Wt 180 lb (81.647 kg)  BMI 28.19 kg/m2  SpO2 98% Physical Exam  Constitutional: He is oriented to person, place, and time. He appears well-developed and well-nourished. No distress.  HENT:  Head: Normocephalic.  Eyes: Conjunctivae are normal. Pupils are equal, round, and reactive to light. No scleral icterus.    Neck: Normal range of motion. Neck supple. No thyromegaly present.  Cardiovascular: Normal rate and regular rhythm.  Exam reveals no gallop and no friction rub.   No murmur heard. Pulmonary/Chest: Effort normal and breath sounds normal. No respiratory distress. He has no wheezes. He has no rales.  Abdominal: Soft. Bowel sounds are normal. He  exhibits no distension. There is no tenderness. There is no rebound.  Musculoskeletal: Normal range of motion.  Neurological: He is alert and oriented to person, place, and time.  Skin: Skin is warm and dry. No rash noted.  Psychiatric: He has a normal mood and affect. His behavior is normal.    ED Course  Procedures (including critical care time) Labs Review Labs Reviewed - No data to display  Imaging Review No results found.   EKG Interpretation None      MDM   Final diagnoses:  Periorbital cellulitis    Plan is home, antibiotics, warm compresses. Drops. Recheck if not improving.    Tanna Furry, MD 01/26/15 [phone]

## 2016-05-24 NOTE — Progress Notes (Addendum)
Office Visit Note  Patient: Kenneth Payne             Date of Birth: 18-Mar-1961           MRN: [id number]             PCP: Wenda Low, MD Referring: Juanita Craver MD Visit Date: 05/25/2016 Occupation: Sales person    Subjective:  Lower back pain.   History of Present Illness: Kenneth Payne is a 55 y.o. male with history of ulcerative colitis has been seen in consultation per request of Dr. Collene Mares. According to patient his symptoms started in 1990s with bloody diarrhea he was diagnosed with ulcerative colitis by Dr. Timmothy Euler. He was treated with prednisone and sulfasalazine. The sulfasalazine was not effective and was discontinued. After Dr. Timmothy Euler retired Dr. Wynetta Emery took over his care. A year ago Humira was added. Recalls that Humira never did control his symptoms completely and he had to take prednisone taper for flares. His last flare was in August 2017 which required prednisone taper his next mild flare happened in this November 2017. He started seeing Dr. Collene Mares who did colonoscopy recently it revealed ulcerative rectosigmoiditis. He also mentioned that she's been having joint pain for the last 6 months which are mostly in his neck, elbows, hip joints, lower back. He denies any joint swelling. He's been having one loose stool per day with some plugged currently.   Activities of Daily Living:  Patient reports morning stiffness for 5 minutes.   Patient Denies nocturnal pain.  Difficulty dressing/grooming: Denies Difficulty climbing stairs: Denies Difficulty getting out of chair: Denies Difficulty using hands for taps, buttons, cutlery, and/or writing: Denies   Review of Systems  Constitutional: Positive for fatigue. Negative for night sweats and weakness ( ).  HENT: Negative for mouth sores, mouth dryness and nose dryness.   Eyes: Negative for redness and dryness.  Respiratory: Negative for shortness of breath and difficulty breathing.   Cardiovascular: Negative for chest pain,  palpitations, hypertension, irregular heartbeat and swelling in legs/feet.  Gastrointestinal: Positive for blood in stool and diarrhea. Negative for constipation.  Endocrine: Negative for increased urination.  Musculoskeletal: Positive for arthralgias, joint pain and morning stiffness. Negative for joint swelling, myalgias, muscle weakness, muscle tenderness and myalgias.  Skin: Negative for color change, rash, hair loss, nodules/bumps, skin tightness, ulcers and sensitivity to sunlight.  Allergic/Immunologic: Negative for susceptible to infections.  Neurological: Negative for dizziness, fainting, memory loss and night sweats.  Hematological: Negative for swollen glands.  Psychiatric/Behavioral: Negative for depressed mood and sleep disturbance. The patient is not nervous/anxious.     PMFS History:  Patient Active Problem List   Diagnosis Date Noted  . High risk medication use 05/25/2016  . Pain, neck 05/25/2016  . Arthralgia of both hands 05/25/2016  . Pain of both sacroiliac joints 05/25/2016  . Neck mass   . Thrombocytosis ([place]) 08/20/2013  . Normocytic anemia 08/19/2013  . Ulcerative colitis ([place]) 08/16/2013  . Ulcerative colitis, acute ([place]) 08/16/2013  . Leukocytosis, unspecified 08/16/2013    Past Medical History:  Diagnosis Date  . Ulcerative colitis ([place])     Family History  Problem Relation Age of Onset  . Asthma Mother   . Stroke Mother   . Hypertension Mother   . Parkinson's disease Father   . Hypertension Father   . Heart attack Maternal Grandmother   . Heart attack Maternal Grandfather   . Stroke Paternal Grandmother   . Heart attack Paternal Grandfather  Past Surgical History:  Procedure Laterality Date  . COLONOSCOPY N/A 08/18/2013   Procedure: COLONOSCOPY;  Surgeon: Winfield Cunas., MD;  Location: [facility];  Service: Endoscopy;  Laterality: N/A;  . HERNIA REPAIR     2009   Social History   Social History Narrative  . No narrative on file      Objective: Vital Signs: BP (!) 148/95 (BP Location: Right Arm, Patient Position: Sitting, Cuff Size: Large)   Pulse 96   Resp 12   Ht 5' 6.75" (1.695 m)   Wt 174 lb (78.9 kg)   BMI 27.46 kg/m    Physical Exam  Constitutional: He is oriented to person, place, and time. He appears well-developed and well-nourished.  HENT:  Head: Normocephalic and atraumatic.  Eyes: Conjunctivae and EOM are normal. Pupils are equal, round, and reactive to light.  Neck: Normal range of motion. Neck supple.  Cardiovascular: Normal rate, regular rhythm and normal heart sounds.   Pulmonary/Chest: Effort normal and breath sounds normal.  Abdominal: Soft. Bowel sounds are normal.  Neurological: He is alert and oriented to person, place, and time.  Skin: Skin is warm and dry. Capillary refill takes less than 2 seconds.  Psychiatric: He has a normal mood and affect. His behavior is normal.  Nursing note and vitals reviewed.    Musculoskeletal Exam: C-spine some stiffness with range of motion, thoracic spine and lumbar spine good range of motion he has some tenderness over SI joint area. Shoulder joints, elbow joints, wrist joints, MCPs PIPs DIPs with good range of motion with no synovitis. Hip joints knee joints ankles MTPs PIPs with good range of motion with no synovitis he has mild tenderness over bilateral trochanteric area.  CDAI Exam: No CDAI exam completed.    Investigation: Findings:  05/11/2016 labs from Dr. Lorie Apley office CBC WBC 10.9, hemoglobin 11.4, platelets 584, CMP normal, TSH normal, colonoscopy revealed ulcerative rectosigmoiditis    Imaging: Xr Cervical Spine 2 Or 3 Views  Result Date: 05/25/2016 There is mild narrowing between C4-5, C5-6. No significant spurring was noted. Impression: These findings are consistent with mild degenerative disc disease.  Xr Pelvis 1-2 Views  Result Date: 05/25/2016 SI joints normal without any sclerosis or erosive changes. Hip joints normal.  Impression: Normal x-ray of the pelvis   Speciality Comments: No specialty comments available.    Procedures:  No procedures performed Allergies: Augmentin [amoxicillin-pot clavulanate] and Flagyl [metronidazole]   Assessment / Plan:     Visit Diagnoses: Ulcerative rectosigmoiditis with rectal bleeding: He continues to have flares of ulcerative colitis despite being on Humira for a year. He has had frequent flares requiring prednisone taper. He is having a mild flare currently.  High risk medication use - Humira 40 mg subcutaneously every other week, prednisone 10 mg a day when necessary. He has not taken prednisone since August 2017. I detailed discussion with the patient today and also with Dr. Collene Mares. She and I both felt that adding Imuran will be beneficial in controlling his GI symptoms and may help [place] arthralgias. Indications side effects contraindications were discussed at length today . Handout was given and consent was taken. I will obtain following labs today if the labs are normal then I will start him on Imuran 50 mg by mouth daily for 2 weeks if the repeat labs are normal then we will increase it 100 mg by mouth daily. Labs will be checked every 2 weeks 2 and then every 2 months once he is on  stable labs the labs can be checked every 3 months. I've also advised to get a pneumococcal vaccine.  Arthralgia : Involving his C-spine, elbow joints, hip joints, lower back. He has no joint swelling is x-rays were unremarkable except for mild distress disease in his C-spine.   His blood pressure is elevated today have advised him to monitor his blood pressure closely and follow up with his PCP.   Orders: Orders Placed This Encounter  Procedures  . XR Pelvis 1-2 Views  . XR Cervical Spine 2 or 3 views  . Hepatitis panel, acute  . HIV antibody  . Quantiferon tb gold assay (blood)  . Protein electrophoresis, serum  . IgG, IgA, IgM  . Thiopurine methyltransferase(tpmt)rbc  .  Sedimentation rate   No orders of the defined types were placed in this encounter.   Face-to-face time spent with patient was 50 minutes. 50% of time was spent in counseling and coordination of care.  Follow-Up Instructions: Next available follow-up   Bo Merino, MD

## 2016-05-25 ENCOUNTER — Ambulatory Visit (INDEPENDENT_AMBULATORY_CARE_PROVIDER_SITE_OTHER): Payer: PRIVATE HEALTH INSURANCE

## 2016-05-25 ENCOUNTER — Encounter: Payer: Self-pay | Admitting: Rheumatology

## 2016-05-25 ENCOUNTER — Ambulatory Visit (INDEPENDENT_AMBULATORY_CARE_PROVIDER_SITE_OTHER): Payer: PRIVATE HEALTH INSURANCE | Admitting: Rheumatology

## 2016-05-25 VITALS — BP 148/95 | HR 96 | Resp 12 | Ht 66.75 in | Wt 174.0 lb

## 2016-05-25 DIAGNOSIS — M542 Cervicalgia: Secondary | ICD-10-CM

## 2016-05-25 DIAGNOSIS — M533 Sacrococcygeal disorders, not elsewhere classified: Secondary | ICD-10-CM

## 2016-05-25 DIAGNOSIS — M25541 Pain in joints of right hand: Secondary | ICD-10-CM

## 2016-05-25 DIAGNOSIS — Z79899 Other long term (current) drug therapy: Secondary | ICD-10-CM | POA: Diagnosis not present

## 2016-05-25 DIAGNOSIS — M25542 Pain in joints of left hand: Secondary | ICD-10-CM

## 2016-05-25 DIAGNOSIS — K51311 Ulcerative (chronic) rectosigmoiditis with rectal bleeding: Secondary | ICD-10-CM | POA: Diagnosis not present

## 2016-05-25 NOTE — Patient Instructions (Addendum)
Please see your primary care provider for your blood pressure.  Please get a pneumococcal vaccine.    Standing Labs We placed an order today for your standing lab work.    Once we get your baseline labs back we will start you on azathioprine 50 mg daily for two weeks then azathioprine 100 mg daily.  Please come back and get your standing labs two weeks after starting azathioprine 50 mg daily, then two weeks after increasing the dose to 100 mg daily, then every 2 months.    We have open lab Monday through Friday from 8:30-11:30 AM and 1-4 PM at the office of Dr. Tresa Moore, PA.   The office is located at [address] [place], [place], [place] [address] No appointment is necessary.   Labs are drawn by [facility].  You may receive a bill from [place] for your lab work.      Azathioprine tablets What is this medicine? AZATHIOPRINE (ay za THYE oh preen) suppresses the immune system. It is used to prevent organ rejection after a transplant. It is also used to treat rheumatoid arthritis. This medicine may be used for other purposes; ask your health care provider or pharmacist if you have questions. COMMON BRAND NAME(S): Azasan, Imuran What should I tell my health care provider before I take this medicine? They need to know if you have any of these conditions: -infection -kidney disease -liver disease -an unusual or allergic reaction to azathioprine, other medicines, lactose, foods, dyes, or preservatives -pregnant or trying to get pregnant -breast feeding How should I use this medicine? Take this medicine by mouth with a full glass of water. Follow the directions on the prescription label. Take your medicine at regular intervals. Do not take your medicine more often than directed. Continue to take your medicine even if you feel better. Do not stop taking except on your doctor's advice. Talk to your pediatrician regarding the use of this medicine in children. Special care  may be needed. Overdosage: If you think you have taken too much of this medicine contact a poison control center or emergency room at once. NOTE: This medicine is only for you. Do not share this medicine with others. What if I miss a dose? If you miss a dose, take it as soon as you can. If it is almost time for your next dose, take only that dose. Do not take double or extra doses. What may interact with this medicine? Do not take this medicine with any of the following medications: -febuxostat -mercaptopurine This medicine may also interact with the following medications: -allopurinol -aminosalicylates like sulfasalazine, mesalamine, balsalazide, and olsalazine -leflunomide -medicines called ACE inhibitors like benazepril, captopril, enalapril, fosinopril, quinapril, lisinopril, ramipril, and trandolapril -mycophenolate -sulfamethoxazole; trimethoprim -vaccines -warfarin This list may not describe all possible interactions. Give your health care provider a list of all the medicines, herbs, non-prescription drugs, or dietary supplements you use. Also tell them if you smoke, drink alcohol, or use illegal drugs. Some items may interact with your medicine. What should I watch for while using this medicine? Visit your doctor or health care professional for regular checks on your progress. You will need frequent blood checks during the first few months you are receiving the medicine. If you get a cold or other infection while receiving this medicine, call your doctor or health care professional. Do not treat yourself. The medicine may increase your risk of getting an infection. Women should inform their doctor if they wish to become  pregnant or think they might be pregnant. There is a potential for serious side effects to an unborn child. Talk to your health care professional or pharmacist for more information. Men may have a reduced sperm count while they are taking this medicine. Talk to your  health care professional for more information. This medicine may increase your risk of getting certain kinds of cancer. Talk to your doctor about healthy lifestyle choices, important screenings, and your risk. What side effects may I notice from receiving this medicine? Side effects that you should report to your doctor or health care professional as soon as possible: -allergic reactions like skin rash, itching or hives, swelling of the face, lips, or tongue -changes in vision -confusion -fever, chills, or any other sign of infection -loss of balance or coordination -severe stomach pain -unusual bleeding, bruising -unusually weak or tired -vomiting -yellowing of the eyes or skin Side effects that usually do not require medical attention (report to your doctor or health care professional if they continue or are bothersome): -hair loss -nausea This list may not describe all possible side effects. Call your doctor for medical advice about side effects. You may report side effects to FDA at 1-800-FDA-1088. Where should I keep my medicine? Keep out of the reach of children. Store at room temperature between 15 and 25 degrees C (59 and 77 degrees F). Protect from light. Throw away any unused medicine after the expiration date. NOTE: This sheet is a summary. It may not cover all possible information. If you have questions about this medicine, talk to your doctor, pharmacist, or health care provider.  2017 Elsevier/Gold Standard (2013-10-06 12:00:31)

## 2016-05-25 NOTE — Progress Notes (Signed)
Pharmacy Note  Subjective: Patient presents today to the [facility] to see Dr. Estanislado Pandy.  Patient seen by the pharmacist for counseling on azathioprine (Imuran).    Objective: CMP (05/11/16) SCr: 1.01 mg/dL BUN: 12 mg/dL GFR: 84 mL/min AST: 13 U/L ALT: 8 U/L Total bilirubin: <0.2 mg/dL  CBC (05/11/16) WBC: 10.9 K/uL RBC: 4.88 MIL/uL Hgb: 11.4 g/dL Hct: 37.7 % PLT: 584 K/uL  TPMT: ordered today  Assessment/Plan: Plan is to initiate patient on azathioprine 50 mg daily for two weeks then 100 mg daily.  Patient was counseled on the purpose, proper use, and adverse effects of azathioprine including risk of infection, nausea, rash, and hair loss.  Reviewed risk of cancer after long term use.  Discussed risk of myelosupression and reviewed importance of frequent lab work to monitor blood counts.  Provided patient with standing lab instructions.  Reviewed drug-drug interactions including contraindication with allopurinol.  Provided patient with educational materials on azathioprine and answered all questions.  Patient consented to azathioprine.  Will upload consent into the media tab.  Will wait for patient's baseline lab results prior to initiation of azathioprine.     Elisabeth Most, Pharm.D., BCPS Clinical Pharmacist Pager: [phone] Phone: [phone] 05/25/2016 10:27 AM

## 2016-05-26 LAB — SEDIMENTATION RATE: Sed Rate: 58 mm/hr — ABNORMAL HIGH (ref 0–20)

## 2016-05-26 LAB — HEPATITIS PANEL, ACUTE
HCV Ab: NEGATIVE
HEP A IGM: NONREACTIVE
Hep B C IgM: NONREACTIVE
Hepatitis B Surface Ag: NEGATIVE

## 2016-05-26 LAB — HIV ANTIBODY (ROUTINE TESTING W REFLEX): HIV: NONREACTIVE

## 2016-05-28 ENCOUNTER — Telehealth: Payer: Self-pay | Admitting: Radiology

## 2016-05-28 LAB — QUANTIFERON TB GOLD ASSAY (BLOOD)
MITOGEN-NIL SO: 0.38 [IU]/mL
QUANTIFERON NIL VALUE: 0.03 [IU]/mL
Quantiferon Tb Ag Minus Nil Value: <0 IU/mL

## 2016-05-28 LAB — IGG, IGA, IGM
IgA: 317 mg/dL (ref 81–463)
IgG (Immunoglobin G), Serum: 1288 mg/dL (ref 694–1618)
IgM, Serum: 79 mg/dL (ref 48–271)

## 2016-05-28 NOTE — Telephone Encounter (Signed)
-----   Message from Cyndia Skeeters, [facility] sent at 05/25/2016 10:27 AM EST ----- Regarding: Imuran Dr. Estanislado Pandy wants to start patient on Imuran once his labs return.

## 2016-05-29 LAB — PROTEIN ELECTROPHORESIS, SERUM
ALPHA-2-GLOBULIN: 0.9 g/dL (ref 0.5–0.9)
Albumin ELP: 3.6 g/dL — ABNORMAL LOW (ref 3.8–4.8)
Alpha-1-Globulin: 0.4 g/dL — ABNORMAL HIGH (ref 0.2–0.3)
BETA 2: 0.4 g/dL (ref 0.2–0.5)
BETA GLOBULIN: 0.5 g/dL (ref 0.4–0.6)
GAMMA GLOBULIN: 1.2 g/dL (ref 0.8–1.7)
Total Protein, Serum Electrophoresis: 7.1 g/dL (ref 6.1–8.1)

## 2016-06-01 LAB — THIOPURINE METHYLTRANSFERASE (TPMT), RBC: Thiopurine Methyltransferase, RBC: 12 nmol/hr/mL RBC — ABNORMAL LOW

## 2016-06-05 ENCOUNTER — Telehealth: Payer: Self-pay | Admitting: *Deleted

## 2016-06-05 DIAGNOSIS — Z139 Encounter for screening, unspecified: Secondary | ICD-10-CM

## 2016-06-05 NOTE — Telephone Encounter (Signed)
Per Dr. Estanislado Pandy patient unable to get Imuran based on labs.

## 2016-06-05 NOTE — Progress Notes (Signed)
Patient needs repeat TB gold. This TPM T is low he would not be able to start on Imuran. We will have to start him on methotrexate after repeat TB gold. Please get a chest x-ray on this patient , we will also need a consent on methotrexate. Plan will be to start him on methotrexate 4 tablets by mouth every week along with folic acid and then increase it to every 6 and later 8 weeks depending on the labs. I discussed the plan with Dr. Collene Mares.

## 2016-06-05 NOTE — Telephone Encounter (Signed)
Patient has been advised of labs. Patient coming to office on 06/07/16 to repeat his TB Gold. Chest X-ray order put in and he he will have that performed on 06/07/16 as well. Patient advised after we receive the results of the TB Gold Dr. Estanislado Pandy would like to start him on MTX. Patient will do consent for MTX when he comes to the office for labs. Patient advised of how medication will be taken and lab schedule. Patient verbalized understanding.

## 2016-06-05 NOTE — Telephone Encounter (Signed)
-----   Message from Bo Merino, MD sent at 06/05/2016 12:41 PM EST ----- Patient needs repeat TB gold. This TPM T is low he would not be able to start on Imuran. We will have to start him on methotrexate after repeat TB gold. Please get a chest x-ray on this patient , we will also need a consent on methotrexate. Plan will  be to start him on methotrexate 4 tablets by mouth every week along with folic acid and then increase it to every 6 and later 8 weeks depending on the labs. I discussed the plan with Dr. Collene Mares.

## 2016-06-07 ENCOUNTER — Other Ambulatory Visit: Payer: Self-pay | Admitting: *Deleted

## 2016-06-07 ENCOUNTER — Encounter: Payer: Self-pay | Admitting: Pharmacist

## 2016-06-07 ENCOUNTER — Ambulatory Visit (HOSPITAL_COMMUNITY)
Admission: RE | Admit: 2016-06-07 | Discharge: 2016-06-07 | Disposition: A | Discharge status: Home / Self Care | Payer: PRIVATE HEALTH INSURANCE | Source: Ambulatory Visit | Attending: Rheumatology | Admitting: Rheumatology

## 2016-06-07 DIAGNOSIS — M1380 Other specified arthritis, unspecified site: Secondary | ICD-10-CM | POA: Insufficient documentation

## 2016-06-07 DIAGNOSIS — Z111 Encounter for screening for respiratory tuberculosis: Secondary | ICD-10-CM | POA: Insufficient documentation

## 2016-06-07 DIAGNOSIS — Z139 Encounter for screening, unspecified: Secondary | ICD-10-CM

## 2016-06-07 NOTE — Progress Notes (Signed)
Pharmacy Note Subjective: Patient presents today to the [facility] for repeat TB Gold test.  Patient was previously counseled on azathioprine.  Patient's TPMT test was low, therefore decision was made by Dr. Frankey Poot. Collene Mares to initiate methotrexate instead.  Patient seen by the pharmacist for counseling on methotrexate.    Objective: CMP (05/11/16) SCr: 1.01 mg/dL BUN: 12 mg/dL GFR: 84 mL/min AST: 13 U/L ALT: 8 U/L Total bilirubin: <0.2 mg/dL  CBC (05/11/16) WBC: 10.9 K/uL RBC: 4.88 MIL/uL Hgb: 11.4 g/dL Hct: 37.7 % PLT: 584 K/uL  TB Gold: indeterminate on 05/25/16.  Test was repeated today Hepatitis panel: negative on 05/25/16 HIV: negative on 05/25/16  Chest-xray:  Patient had chest x-ray done today.  Results pending.    Assessment/Plan:  Patient was counseled on the purpose, proper use, and adverse effects of methotrexate including nausea, infection, and signs and symptoms of pneumonitis.  Reviewed instructions with patient to take methotrexate weekly along with folic acid daily.  Discussed the importance of frequent monitoring of kidney and liver function and blood counts, and provided patient with standing lab instructions.  Counseled patient to avoid sulfa antibiotics such as Bactrim or Septra while on methotrexate.  Provided patient with educational materials on methotrexate and answered all questions.  Advised patient to get annual influenza vaccine and to get a pneumococcal vaccine if patient has not already had one.  Patient voiced understanding.  Patient signed consent on methotrexate.  Will wait for results of repeat TB Gold and results of chest x-ray prior to initiation of methotrexate.    Elisabeth Most, Pharm.D., BCPS Clinical Pharmacist Pager: [phone] Phone: [phone] 06/07/2016 3:10 PM

## 2016-06-09 LAB — QUANTIFERON TB GOLD ASSAY (BLOOD)
INTERFERON GAMMA RELEASE ASSAY: NEGATIVE
MITOGEN-NIL SO: 4.19 [IU]/mL
QUANTIFERON NIL VALUE: 0.05 [IU]/mL
QUANTIFERON TB AG MINUS NIL: 0 [IU]/mL

## 2016-06-11 ENCOUNTER — Telehealth: Payer: Self-pay | Admitting: Pharmacist

## 2016-06-11 NOTE — Telephone Encounter (Signed)
Repeat TB Gold negative on 06/07/16.   Chest X-ray completed on 06/07/16: "IMPRESSION:  No active cardiopulmonary disease."  Patient was counseled on methotrexate on 06/07/16 and signed consent.   Okay to initiate methotrexate at this time?

## 2016-06-11 NOTE — Telephone Encounter (Signed)
Yes

## 2016-06-11 NOTE — Progress Notes (Signed)
TB neg

## 2016-06-12 ENCOUNTER — Telehealth: Payer: Self-pay | Admitting: Pharmacist

## 2016-06-12 MED ORDER — METHOTREXATE 2.5 MG PO TABS: ORAL_TABLET | ORAL | 2 refills | Status: DC

## 2016-06-12 MED ORDER — FOLIC ACID 1 MG PO TABS: 2.0000 mg | ORAL_TABLET | Freq: Every day | ORAL | 3 refills | Status: DC

## 2016-06-12 NOTE — Telephone Encounter (Signed)
Plan per Dr. Estanislado Pandy on 06/05/16:  "Plan will  be to start him on methotrexate 4 tablets by mouth every week along with folic acid and then increase it to every 6 and later 8 weeks depending on the labs."    Prescription for methotrexate was sent to the pharmacy.  Seth Bake, can you please send in prescription for folic acid?

## 2016-06-12 NOTE — Telephone Encounter (Signed)
Patient has been advised of test results and prescription sent to the pharmacy.

## 2016-06-12 NOTE — Telephone Encounter (Signed)
Prescription sent to the pharmacy.

## 2016-06-20 ENCOUNTER — Telehealth: Payer: Self-pay | Admitting: Rheumatology

## 2016-06-20 NOTE — Telephone Encounter (Signed)
Patient would like to speak to Dr. Koleen Nimrod about starting the MTX. Please call patient on his cell phone.

## 2016-06-21 NOTE — Telephone Encounter (Signed)
Patient has been reading up on the side effects on the Methotrexate and wanted to know how likely it is that the medication will cause side effects. Patient advised that each person is different and tolerate medication differently so there is no way of telling him exactly what side effects if any that he will experience. Patient will begin his Methotrexate this evening

## 2016-06-26 ENCOUNTER — Telehealth: Payer: Self-pay | Admitting: Pharmacist

## 2016-06-26 NOTE — Telephone Encounter (Signed)
Patient called last week with questions on his methotrexate.  Patient spoke to our nurse, Seth Bake, regarding his questions as I was out of the office at this time.  I called patient to follow up.  He denies any questions at this time.  He reports he started his methotrexate on 06/20/16.  He reports tolerating the dose well.  I confirmed with patient that he is currently taking the methotrexate 4 tablets weekly and folic acid daily.  Reminded patient that he will be due for standing labs next week before increasing methotrexate dose to 6 tablets weekly.  Patient voiced understanding.     Elisabeth Most, Pharm.D., BCPS Clinical Pharmacist Pager: [phone] Phone: [phone] 06/26/2016 9:27 AM

## 2016-07-02 ENCOUNTER — Other Ambulatory Visit: Payer: Self-pay | Admitting: *Deleted

## 2016-07-02 DIAGNOSIS — Z79899 Other long term (current) drug therapy: Secondary | ICD-10-CM

## 2016-07-02 LAB — COMPLETE METABOLIC PANEL WITH GFR
ALK PHOS: 55 U/L (ref 40–115)
ALT: 16 U/L (ref 9–46)
AST: 16 U/L (ref 10–35)
Albumin: 4 g/dL (ref 3.6–5.1)
BILIRUBIN TOTAL: 0.3 mg/dL (ref 0.2–1.2)
BUN: 15 mg/dL (ref 7–25)
CALCIUM: 9.3 mg/dL (ref 8.6–10.3)
CO2: 27 mmol/L (ref 20–31)
CREATININE: 0.98 mg/dL (ref 0.70–1.33)
Chloride: 101 mmol/L (ref 98–110)
GFR, EST NON AFRICAN AMERICAN: 87 mL/min (ref >=60)
Glucose, Bld: 92 mg/dL (ref 65–99)
Potassium: 4.9 mmol/L (ref 3.5–5.3)
Sodium: 135 mmol/L (ref 135–146)
TOTAL PROTEIN: 7.2 g/dL (ref 6.1–8.1)

## 2016-07-02 LAB — CBC WITH DIFFERENTIAL/PLATELET
BASOS ABS: 128 {cells}/uL (ref 0–200)
BASOS PCT: 1 %
EOS ABS: 128 {cells}/uL (ref 15–500)
Eosinophils Relative: 1 %
HCT: 33.9 % — ABNORMAL LOW (ref 38.5–50.0)
Hemoglobin: 10.6 g/dL — ABNORMAL LOW (ref 13.2–17.1)
LYMPHS PCT: 7 %
Lymphs Abs: 896 cells/uL (ref 850–3900)
MCH: 23.3 pg — AB (ref 27.0–33.0)
MCHC: 31.3 g/dL — ABNORMAL LOW (ref 32.0–36.0)
MCV: 74.7 fL — AB (ref 80.0–100.0)
MONOS PCT: 4 %
MPV: 9.1 fL (ref 7.5–12.5)
Monocytes Absolute: 512 cells/uL (ref 200–950)
Neutro Abs: 11136 cells/uL — ABNORMAL HIGH (ref 1500–7800)
Neutrophils Relative %: 87 %
PLATELETS: 544 10*3/uL — AB (ref 140–400)
RBC: 4.54 MIL/uL (ref 4.20–5.80)
RDW: 17.4 % — AB (ref 11.0–15.0)
WBC: 12.8 10*3/uL — ABNORMAL HIGH (ref 3.8–10.8)

## 2016-07-03 NOTE — Progress Notes (Signed)
Labs are stable.

## 2016-07-09 NOTE — Progress Notes (Signed)
Office Visit Note  Patient: Kenneth Payne             Date of Birth: 1960-11-29           MRN: [id number]             PCP: Wenda Low, MD Referring: Wenda Low, MD Visit Date: 07/10/2016 Occupation: Sales Rep    Subjective:  LBP   History of Present Illness: Kenneth Payne is a 56 y.o. male history of ulcerative colitis and arthralgias. He was started on methotrexate 06/21/2016. He's been increasing his methotrexate gradually and is currently on extended tablets per week. He also tapered off prednisone and has been off prednisone for a week now. He's been taking Humira on regular basis. He states his arthralgias has almost resolved he does have some lower back pain. He believes he is basically pain free. The flare of ulcerative colitis has resolved as well.  Activities of Daily Living:  Patient reports morning stiffness for 0 minute.   Patient Denies nocturnal pain.  Difficulty dressing/grooming: Denies Difficulty climbing stairs: Denies Difficulty getting out of chair: Denies Difficulty using hands for taps, buttons, cutlery, and/or writing: Denies   Review of Systems  Constitutional: Negative for fatigue, night sweats and weakness ( ).  HENT: Negative for mouth sores, mouth dryness and nose dryness.   Eyes: Negative for redness and dryness.  Respiratory: Negative for shortness of breath and difficulty breathing.   Cardiovascular: Negative for chest pain, palpitations, hypertension, irregular heartbeat and swelling in legs/feet.  Gastrointestinal: Negative for constipation and diarrhea.  Endocrine: Negative for increased urination.  Musculoskeletal: Negative for arthralgias, joint pain, joint swelling, myalgias, muscle weakness, morning stiffness, muscle tenderness and myalgias.  Skin: Negative for color change, rash, hair loss, nodules/bumps, skin tightness, ulcers and sensitivity to sunlight.  Allergic/Immunologic: Negative for susceptible to infections.  Neurological:  Negative for dizziness, fainting, memory loss and night sweats.  Hematological: Negative for swollen glands.  Psychiatric/Behavioral: Negative for depressed mood and sleep disturbance. The patient is not nervous/anxious.     PMFS History:  Patient Active Problem List   Diagnosis Date Noted  . High risk medication use 05/25/2016  . Pain, neck 05/25/2016  . Arthralgia of both hands 05/25/2016  . Pain of both sacroiliac joints 05/25/2016  . Neck mass   . Thrombocytosis ([place]) 08/20/2013  . Normocytic anemia 08/19/2013  . Ulcerative colitis ([place]) 08/16/2013  . Ulcerative colitis, acute ([place]) 08/16/2013  . Leukocytosis, unspecified 08/16/2013    Past Medical History:  Diagnosis Date  . Ulcerative colitis ([place])     Family History  Problem Relation Age of Onset  . Asthma Mother   . Stroke Mother   . Hypertension Mother   . Parkinson's disease Father   . Hypertension Father   . Heart attack Maternal Grandmother   . Heart attack Maternal Grandfather   . Stroke Paternal Grandmother   . Heart attack Paternal Grandfather    Past Surgical History:  Procedure Laterality Date  . COLONOSCOPY N/A 08/18/2013   Procedure: COLONOSCOPY;  Surgeon: Winfield Cunas., MD;  Location: [facility];  Service: Endoscopy;  Laterality: N/A;  . HERNIA REPAIR     2009   Social History   Social History Narrative  . No narrative on file     Objective: Vital Signs: BP (!) 170/97 (BP Location: Left Arm, Patient Position: Sitting, Cuff Size: Large)   Pulse 93   Resp 13   Wt 175 lb (79.4 kg)  BMI 27.61 kg/m    Physical Exam  Constitutional: He is oriented to person, place, and time. He appears well-developed and well-nourished.  HENT:  Head: Normocephalic and atraumatic.  Eyes: Conjunctivae and EOM are normal. Pupils are equal, round, and reactive to light.  Neck: Normal range of motion. Neck supple.  Cardiovascular: Normal rate, regular rhythm and normal heart sounds.   Pulmonary/Chest:  Effort normal and breath sounds normal.  Abdominal: Soft. Bowel sounds are normal.  Neurological: He is alert and oriented to person, place, and time.  Skin: Skin is warm and dry. Capillary refill takes less than 2 seconds.  Psychiatric: He has a normal mood and affect. His behavior is normal.  Nursing note and vitals reviewed.    Musculoskeletal Exam: C-spine and thoracic lumbar spine good range of motion no SI joint tenderness. Shoulder joints elbow joints wrist joints MCPs PIPs DIPs were all good range of motion with no synovitis hip joints knee joints ankles MTPs PIPs with good range of motion with no synovitis.  CDAI Exam: No CDAI exam completed.    Investigation: Findings:  05/25/2016 CBC WBC 12.8, hemoglobin 10.6, CMP normal, ESR 58, immunoglobulins normal, SPEP mild restriction, hepatitis panel negative, HIV negative, TB indeterminate,, TPM T intermediate at 12 06/07/2016 TB gold negative    Imaging: No results found.  Speciality Comments: No specialty comments available.    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 Collene Mares. He has generally bilateral the prednisone taper and he is off prednisone now. He is also tolerating methotrexate quite well.  High risk medication use - Humira 40 mg subcutaneous every other week, methotrexate 6 tablets by mouth every week, folic acid 2 mg by mouth daily and he is off prednisone. The plan is to increase his methotrexate to 8 tablets by mouth every week. We'll check labs every 2 weeks 2 and then every 3 months to monitor for drug toxicity. - Plan: CBC with Differential/Platelet, COMPLETE METABOLIC PANEL WITH GFR  Arthralgia of both hands: Resolved  Pain of both sacroiliac joints - X-rays unremarkable last visit: SI joint is almost  resolved.  Pain, neck - Mild DDD    Orders: Orders Placed This Encounter  Procedures  . CBC with Differential/Platelet  . COMPLETE METABOLIC PANEL WITH GFR   No orders of the defined types were placed in this encounter.   Face-to-face time spent with patient was 30 minutes. 50% of time was spent in counseling and coordination of care.  Follow-Up Instructions: Return in about 3 months (around 10/08/2016) for Ulcerative colitis.   Bo Merino, MD  Note - This record has been created using Editor, commissioning.  Chart creation errors have been sought, but may not always  have been located. Such creation errors do not reflect on  the standard of medical care.

## 2016-07-10 ENCOUNTER — Ambulatory Visit (INDEPENDENT_AMBULATORY_CARE_PROVIDER_SITE_OTHER): Payer: PRIVATE HEALTH INSURANCE | Admitting: Rheumatology

## 2016-07-10 ENCOUNTER — Encounter: Payer: Self-pay | Admitting: Rheumatology

## 2016-07-10 VITALS — BP 170/97 | HR 93 | Resp 13 | Wt 175.0 lb

## 2016-07-10 DIAGNOSIS — M25541 Pain in joints of right hand: Secondary | ICD-10-CM | POA: Diagnosis not present

## 2016-07-10 DIAGNOSIS — K51311 Ulcerative (chronic) rectosigmoiditis with rectal bleeding: Secondary | ICD-10-CM | POA: Diagnosis not present

## 2016-07-10 DIAGNOSIS — M25542 Pain in joints of left hand: Secondary | ICD-10-CM

## 2016-07-10 DIAGNOSIS — Z79899 Other long term (current) drug therapy: Secondary | ICD-10-CM | POA: Diagnosis not present

## 2016-07-10 DIAGNOSIS — M533 Sacrococcygeal disorders, not elsewhere classified: Secondary | ICD-10-CM

## 2016-07-10 DIAGNOSIS — M542 Cervicalgia: Secondary | ICD-10-CM

## 2016-07-10 NOTE — Patient Instructions (Signed)
Standing Labs We placed an order today for your standing lab work.    Please come back and get your standing labs next weeks, then 2 weeks after starting methotrexate 8 tablets weekly, then every 2 months.    We have open lab Monday through Friday from 8:30-11:30 AM and 1:30-4 PM at the office of Dr. Tresa Moore, PA.   The office is located at [address], [place], [place],  [address] No appointment is necessary.   Labs are drawn by [facility].  You may receive a bill from [facility] for your lab work.

## 2016-07-10 NOTE — Progress Notes (Signed)
Rheumatology Medication Review by a Pharmacist Does the patient feel that his/her medications are working for him/her?  Yes - reports improvement in joint pain Has the patient been experiencing any side effects to the medications prescribed?  No Does the patient have any problems obtaining medications?  No  Issues to address at subsequent visits: None   Pharmacist comments:  Kenneth Payne is a pleasant 56 yo M who presents for follow up.  He is currently taking Humira (prescribed by Dr. Collene Mares), methotrexate weekly, and folic acid 2 mg daily.  He reports he started the methotrexate on 06/21/16.  He took 4 tablets weekly for two weeks then had follow up labs.  He is now taking 6 tablets weekly.  He started the 6 tablet weekly dose last Thursday.  I advised patient that he will be due for standing labs again next week before increasing to 8 tablets weekly, and he will be due for labs again two weeks after increasing his methotrexate dose to 8 tablets weekly.  Patient voiced understanding.  Patient denies any questions or concerns regarding his medications at this time.    Elisabeth Most, Pharm.D., BCPS, CPP Clinical Pharmacist Pager: [phone] Phone: [phone] 07/10/2016 12:15 PM

## 2016-07-12 ENCOUNTER — Encounter (HOSPITAL_BASED_OUTPATIENT_CLINIC_OR_DEPARTMENT_OTHER): Payer: Self-pay

## 2016-07-12 ENCOUNTER — Emergency Department (HOSPITAL_BASED_OUTPATIENT_CLINIC_OR_DEPARTMENT_OTHER)
Admission: EM | Admit: 2016-07-12 | Discharge: 2016-07-12 | Disposition: A | Discharge status: Home / Self Care | Payer: PRIVATE HEALTH INSURANCE | Attending: Emergency Medicine | Admitting: Emergency Medicine

## 2016-07-12 DIAGNOSIS — B9789 Other viral agents as the cause of diseases classified elsewhere: Secondary | ICD-10-CM

## 2016-07-12 DIAGNOSIS — J069 Acute upper respiratory infection, unspecified: Secondary | ICD-10-CM | POA: Insufficient documentation

## 2016-07-12 HISTORY — DX: Rheumatoid arthritis, unspecified: M06.9

## 2016-07-12 MED ORDER — GUAIFENESIN-CODEINE 100-10 MG/5ML PO SOLN: 10.0000 mL | Freq: Four times a day (QID) | ORAL | 0 refills | Status: DC | PRN

## 2016-07-12 MED FILL — GUAIATUSSIN AC LIQUID: 100-10 | 3 days supply | Qty: 120 | Fill #0

## 2016-07-12 NOTE — ED Triage Notes (Signed)
Cough x 2 days-NAD-steady gait

## 2016-07-12 NOTE — ED Provider Notes (Signed)
[facility] Provider Note   CSN: [id number] Arrival date & time: 07/12/16  1244     History   Chief Complaint Chief Complaint  Patient presents with  . Cough    HPI Kenneth Payne is a 56 y.o. male.  Patient presents with complaints of cough and congestion for the past 2 days. He reports low-grade fevers at home. He denies any chest pain or difficulty breathing. He has been around ill contacts with similar symptoms. He has tried over-the-counter medications with minimal relief.      Past Medical History:  Diagnosis Date  . Rheumatoid arthritis ([place])   . Ulcerative colitis [facility])     Patient Active Problem List   Diagnosis Date Noted  . High risk medication use 05/25/2016  . Pain, neck 05/25/2016  . Arthralgia of both hands 05/25/2016  . Pain of both sacroiliac joints 05/25/2016  . Neck mass   . Thrombocytosis ([place]) 08/20/2013  . Normocytic anemia 08/19/2013  . Ulcerative colitis ([place]) 08/16/2013  . Ulcerative colitis, acute ([place]) 08/16/2013  . Leukocytosis, unspecified 08/16/2013    Past Surgical History:  Procedure Laterality Date  . COLONOSCOPY N/A 08/18/2013   Procedure: COLONOSCOPY;  Surgeon: Winfield Cunas., MD;  Location: [facility];  Service: Endoscopy;  Laterality: N/A;  . HERNIA REPAIR     2009       Home Medications    Prior to Admission medications   Medication Sig Start Date End Date Taking? Authorizing Provider  Adalimumab (HUMIRA PEN) 40 MG/0.8ML PNKT Inject 40 mg into the skin every 14 (fourteen) days. Next injection 01/01/15    Historical Provider, MD  folic acid (FOLVITE) 1 MG tablet Take 2 tablets (2 mg total) by mouth daily. 06/12/16   Bo Merino, MD  guaiFENesin-codeine 100-10 MG/5ML syrup Take 10 mLs by mouth every 6 (six) hours as needed. 07/12/16   Veryl Speak, MD  methotrexate (RHEUMATREX) 2.5 MG tablet Take 4 tablets by mouth x 2 weeks, then labs, then 6 tablets by mouth x 2 weeks, then labs then 8 tablets by  mouth weekly. Patient taking differently: Take 20 mg by mouth once a week. Take 4 tablets by mouth x 2 weeks, then labs, then 6 tablets by mouth x 2 weeks, then labs then 8 tablets by mouth weekly. 06/12/16   Bo Merino, MD    Family History Family History  Problem Relation Age of Onset  . Asthma Mother   . Stroke Mother   . Hypertension Mother   . Parkinson's disease Father   . Hypertension Father   . Heart attack Maternal Grandmother   . Heart attack Maternal Grandfather   . Stroke Paternal Grandmother   . Heart attack Paternal Grandfather     Social History Social History  Substance Use Topics  . Smoking status: Never Smoker  . Smokeless tobacco: Never Used  . Alcohol use No     Allergies   Augmentin [amoxicillin-pot clavulanate] and Flagyl [metronidazole]   Review of Systems Review of Systems  All other systems reviewed and are negative.    Physical Exam Updated Vital Signs BP 125/87 (BP Location: Right Arm)   Pulse 99   Temp 98.8 F (37.1 C) (Oral)   Resp 18   SpO2 99%   Physical Exam  Constitutional: He is oriented to person, place, and time. He appears well-developed and well-nourished. No distress.  HENT:  Head: Normocephalic and atraumatic.  Mouth/Throat: Oropharynx is clear and moist.  Neck: Normal range of  motion. Neck supple.  Cardiovascular: Normal rate and regular rhythm.  Exam reveals no friction rub.   No murmur heard. Pulmonary/Chest: Effort normal and breath sounds normal. No respiratory distress. He has no wheezes. He has no rales.  Abdominal: Soft. Bowel sounds are normal. He exhibits no distension. There is no tenderness.  Musculoskeletal: Normal range of motion. He exhibits no edema.  Neurological: He is alert and oriented to person, place, and time. Coordination normal.  Skin: Skin is warm and dry. He is not diaphoretic.  Nursing note and vitals reviewed.    ED Treatments / Results  Labs (all labs ordered are listed, but  only abnormal results are displayed) Labs Reviewed - No data to display  EKG  EKG Interpretation None       Radiology No results found.  Procedures Procedures (including critical care time)  Medications Ordered in ED Medications - No data to display   Initial Impression / Assessment and Plan / ED Course  I have reviewed the triage vital signs and the nursing notes.  Pertinent labs & imaging results that were available during my care of the patient were reviewed by me and considered in my medical decision making (see chart for details).     Symptoms most likely viral in nature. His vitals are stable, lungs are clear, and there is no hypoxia. He will be given Robitussin with codeine and is encouraged to continue over-the-counter medications as needed for symptomatic relief.  Final Clinical Impressions(s) / ED Diagnoses   Final diagnoses:  Viral URI with cough    New Prescriptions Discharge Medication List as of 07/12/2016  1:31 PM    START taking these medications   Details  guaiFENesin-codeine 100-10 MG/5ML syrup Take 10 mLs by mouth every 6 (six) hours as needed., Starting Thu 07/12/2016, Print         Veryl Speak, MD 07/12/16 1540

## 2016-07-12 NOTE — Discharge Instructions (Signed)
Robitussin with codeine as prescribed as needed for cough.  Follow-up with your primary Dr. if not improving in the next 3-4 days, and return to the ER if you develop chest pains, difficulty breathing, or other new and concerning symptoms.

## 2016-07-12 NOTE — ED Notes (Signed)
Pt directed to pharmacy to pick up prescription

## 2016-07-20 ENCOUNTER — Other Ambulatory Visit: Payer: Self-pay | Admitting: *Deleted

## 2016-07-20 DIAGNOSIS — Z79899 Other long term (current) drug therapy: Secondary | ICD-10-CM

## 2016-07-20 LAB — CBC WITH DIFFERENTIAL/PLATELET
BASOS ABS: 0 {cells}/uL (ref 0–200)
BASOS PCT: 0 %
EOS ABS: 182 {cells}/uL (ref 15–500)
EOS PCT: 2 %
HCT: 34 % — ABNORMAL LOW (ref 38.5–50.0)
HEMOGLOBIN: 10.7 g/dL — AB (ref 13.2–17.1)
LYMPHS ABS: 1638 {cells}/uL (ref 850–3900)
Lymphocytes Relative: 18 %
MCH: 23 pg — AB (ref 27.0–33.0)
MCHC: 31.5 g/dL — ABNORMAL LOW (ref 32.0–36.0)
MCV: 73.1 fL — ABNORMAL LOW (ref 80.0–100.0)
MPV: 9.6 fL (ref 7.5–12.5)
Monocytes Absolute: 819 cells/uL (ref 200–950)
Monocytes Relative: 9 %
NEUTROS ABS: 6461 {cells}/uL (ref 1500–7800)
Neutrophils Relative %: 71 %
PLATELETS: 453 10*3/uL — AB (ref 140–400)
RBC: 4.65 MIL/uL (ref 4.20–5.80)
RDW: 18.4 % — ABNORMAL HIGH (ref 11.0–15.0)
WBC: 9.1 10*3/uL (ref 3.8–10.8)

## 2016-07-21 LAB — COMPLETE METABOLIC PANEL WITH GFR
ALBUMIN: 3.8 g/dL (ref 3.6–5.1)
ALT: 14 U/L (ref 9–46)
AST: 15 U/L (ref 10–35)
Alkaline Phosphatase: 60 U/L (ref 40–115)
BILIRUBIN TOTAL: 0.3 mg/dL (ref 0.2–1.2)
BUN: 11 mg/dL (ref 7–25)
CO2: 26 mmol/L (ref 20–31)
CREATININE: 1.23 mg/dL (ref 0.70–1.33)
Calcium: 8.5 mg/dL — ABNORMAL LOW (ref 8.6–10.3)
Chloride: 102 mmol/L (ref 98–110)
GFR, EST NON AFRICAN AMERICAN: 66 mL/min (ref >=60)
GFR, Est African American: 76 mL/min (ref >=60)
GLUCOSE: 72 mg/dL (ref 65–99)
Potassium: 4.5 mmol/L (ref 3.5–5.3)
SODIUM: 139 mmol/L (ref 135–146)
TOTAL PROTEIN: 6.6 g/dL (ref 6.1–8.1)

## 2016-07-23 NOTE — Progress Notes (Signed)
Labs stable, fax to PCP and Gi

## 2016-09-25 ENCOUNTER — Telehealth: Payer: Self-pay | Admitting: Pharmacist

## 2016-09-25 NOTE — Telephone Encounter (Signed)
Dr. Estanislado Pandy received notification from Dr. Collene Mares yesterday stating patient's UC was flaring.  I was asked to follow up with patient regarding methotrexate.  Patient was prescribed oral methotrexate 06/12/16.    I spoke to patient who confirms he is taking methotrexate 8 tablets weekly.  He has been on this dose since end of January.  Patient reports he forgot he needed to get labs after going up to 8 tablets weekly and he has not had labs since increasing his dose.  I advised patient of the importance of frequent monitoring of CBC and CMP on methotrexate.  Patient confirms he will come get labs ASAP.    Advised patient that Dr. Estanislado Pandy is considering trial of injectable methotrexate once his labs return.  Advised patient that I can counsel him on injectable methotrexate while he is in the office for labs.     Elisabeth Most, Pharm.D., BCPS, CPP Clinical Pharmacist Pager: [phone] Phone: [phone] 09/25/2016 3:09 PM

## 2016-09-28 ENCOUNTER — Other Ambulatory Visit: Payer: Self-pay | Admitting: *Deleted

## 2016-09-28 ENCOUNTER — Other Ambulatory Visit: Payer: Self-pay | Admitting: Rheumatology

## 2016-09-28 ENCOUNTER — Encounter: Payer: Self-pay | Admitting: Pharmacist

## 2016-09-28 DIAGNOSIS — Z79899 Other long term (current) drug therapy: Secondary | ICD-10-CM

## 2016-09-28 LAB — COMPLETE METABOLIC PANEL WITH GFR
ALBUMIN: 4 g/dL (ref 3.6–5.1)
ALK PHOS: 63 U/L (ref 40–115)
ALT: 8 U/L — ABNORMAL LOW (ref 9–46)
AST: 12 U/L (ref 10–35)
BILIRUBIN TOTAL: 0.3 mg/dL (ref 0.2–1.2)
BUN: 13 mg/dL (ref 7–25)
CALCIUM: 8.9 mg/dL (ref 8.6–10.3)
CO2: 26 mmol/L (ref 20–31)
Chloride: 104 mmol/L (ref 98–110)
Creat: 1.05 mg/dL (ref 0.70–1.33)
GFR, Est African American: >89 mL/min (ref >=60)
GFR, Est Non African American: 80 mL/min (ref >=60)
GLUCOSE: 88 mg/dL (ref 65–99)
Potassium: 4.2 mmol/L (ref 3.5–5.3)
SODIUM: 139 mmol/L (ref 135–146)
TOTAL PROTEIN: 6.9 g/dL (ref 6.1–8.1)

## 2016-09-28 LAB — CBC WITH DIFFERENTIAL/PLATELET
Basophils Absolute: 113 cells/uL (ref 0–200)
Basophils Relative: 1 %
Eosinophils Absolute: 452 cells/uL (ref 15–500)
Eosinophils Relative: 4 %
HCT: 33.1 % — ABNORMAL LOW (ref 38.5–50.0)
Hemoglobin: 10.4 g/dL — ABNORMAL LOW (ref 13.2–17.1)
Lymphocytes Relative: 16 %
Lymphs Abs: 1808 cells/uL (ref 850–3900)
MCH: 23.8 pg — ABNORMAL LOW (ref 27.0–33.0)
MCHC: 31.4 g/dL — ABNORMAL LOW (ref 32.0–36.0)
MCV: 75.7 fL — ABNORMAL LOW (ref 80.0–100.0)
MPV: 9.4 fL (ref 7.5–12.5)
Monocytes Absolute: 1017 cells/uL — ABNORMAL HIGH (ref 200–950)
Monocytes Relative: 9 %
Neutro Abs: 7910 cells/uL — ABNORMAL HIGH (ref 1500–7800)
Neutrophils Relative %: 70 %
Platelets: 579 10*3/uL — ABNORMAL HIGH (ref 140–400)
RBC: 4.37 MIL/uL (ref 4.20–5.80)
RDW: 20.8 % — ABNORMAL HIGH (ref 11.0–15.0)
WBC: 11.3 10*3/uL — ABNORMAL HIGH (ref 3.8–10.8)

## 2016-09-28 NOTE — Progress Notes (Signed)
Spoke to patient today while he was in the office for blood draw.  Patient is currently taking methotrexate 8 tablets (20 mg) by mouth weekly and folic acid 2 mg daily.  Patient is being switched over to injectable methotrexate.  Patient reports his next methotrexate dose is due next Thursday (10/04/16).    Educated patient on how to use a vial and syringe and reviewed injection technique with patient.  Patient was able to demonstrate proper technique for injections using vial and syringe.  Provided patient on educational material regarding injection technique and storage of methotrexate.  Will plan to send in methotrexate prescription once patient's labs return next week.     Elisabeth Most, Pharm.D., BCPS, CPP Clinical Pharmacist Pager: [phone] Phone: [phone] 09/28/2016 3:48 PM

## 2016-09-30 NOTE — Progress Notes (Signed)
Labs are stable. He is a still anemic. If he is on 8 tablets of methotrexate weekly and switch him to subcutaneous methotrexate. Please discuss with patient if he is willing to switch to subcutaneous methotrexate we should have him come back in the office for education. His methotrexate can be increased up to 1 mL subcutaneous every week if tolerated.

## 2016-10-01 ENCOUNTER — Telehealth: Payer: Self-pay | Admitting: Rheumatology

## 2016-10-01 ENCOUNTER — Telehealth: Payer: Self-pay | Admitting: Pharmacist

## 2016-10-01 MED ORDER — "TUBERCULIN-ALLERGY SYRINGES 27G X 1/2"" 1 ML KIT": 1.0000 | PACK | 3 refills | Status: DC

## 2016-10-01 MED ORDER — METHOTREXATE SODIUM CHEMO INJECTION 50 MG/2ML: INTRAMUSCULAR | 0 refills | Status: DC

## 2016-10-01 NOTE — Telephone Encounter (Signed)
I spoke to [facility] at [facility] regarding methotrexate prescription.  They are only able to get the 10 mL vials not the 2 mL vials.  I informed patient who requested the prescription go to [facility] instead.  [facility] was advised to cancel patient's prescription.  Will resend prescription to [facility]

## 2016-10-01 NOTE — Telephone Encounter (Signed)
[facility] left a message in regards to the patient's MTX injection.  They have a question about package size and availability.  VC#[phone].  Thank you.

## 2016-10-01 NOTE — Telephone Encounter (Signed)
-----   Message from Bo Merino, MD sent at 09/30/2016 10:15 PM EDT ----- Labs are stable. He is a still anemic. If he is on 8 tablets of methotrexate weekly and switch him to subcutaneous methotrexate. Please discuss with patient if he is willing to switch to subcutaneous methotrexate we should have him come back in the o ffice for education. His methotrexate can be increased up to 1 mL subcutaneous every week if tolerated.

## 2016-10-01 NOTE — Telephone Encounter (Signed)
Reviewed labs with patient.  I educated him on use of subcutaneous methotrexate while he was in the office for lab draw (see progress note on 09/28/16).  Patient agrees to trial of subcutaneous methotrexate.  Patient advised to take methotrexate 0.8 mL weekly for two weeks then get labs.  Advised he can increase dose to 1 mL weekly after his labs if his labs are normal and he is tolerating subcutaneous methotrexate.  Patient voiced understanding.  He confirms he is still taking folic acid 2 mg daily and does not need a refill of folic acid.  He denies any questions or concerns regarding his medications at this time.     Elisabeth Most, Pharm.D., BCPS, CPP Clinical Pharmacist Pager: [phone] Phone: [phone] 10/01/2016 9:27 AM

## 2016-10-02 NOTE — Progress Notes (Deleted)
Office Visit Note  Patient: Kenneth Payne             Date of Birth: 01-10-1961           MRN: [id number]             PCP: Wenda Low, MD Referring: Wenda Low, MD Visit Date: 10/15/2016 Occupation: @GUAROCC @    Subjective:  No chief complaint on file.   History of Present Illness: Kenneth Payne is a 56 y.o. male ***   Activities of Daily Living:  Patient reports morning stiffness for *** {minute/hour:19697}.   Patient {ACTIONS;DENIES/REPORTS:21021675::"Denies"} nocturnal pain.  Difficulty dressing/grooming: {ACTIONS;DENIES/REPORTS:21021675::"Denies"} Difficulty climbing stairs: {ACTIONS;DENIES/REPORTS:21021675::"Denies"} Difficulty getting out of chair: {ACTIONS;DENIES/REPORTS:21021675::"Denies"} Difficulty using hands for taps, buttons, cutlery, and/or writing: {ACTIONS;DENIES/REPORTS:21021675::"Denies"}   No Rheumatology ROS completed.   PMFS History:  Patient Active Problem List   Diagnosis Date Noted  . High risk medication use 05/25/2016  . Pain, neck 05/25/2016  . Arthralgia of both hands 05/25/2016  . Pain of both sacroiliac joints 05/25/2016  . Neck mass   . Thrombocytosis ([place]) 08/20/2013  . Normocytic anemia 08/19/2013  . Ulcerative colitis ([place]) 08/16/2013  . Ulcerative colitis, acute ([place]) 08/16/2013  . History of leukocytosis 08/16/2013    Past Medical History:  Diagnosis Date  . Rheumatoid arthritis ([place])   . Ulcerative colitis ([place])     Family History  Problem Relation Age of Onset  . Asthma Mother   . Stroke Mother   . Hypertension Mother   . Parkinson's disease Father   . Hypertension Father   . Heart attack Maternal Grandmother   . Heart attack Maternal Grandfather   . Stroke Paternal Grandmother   . Heart attack Paternal Grandfather    Past Surgical History:  Procedure Laterality Date  . COLONOSCOPY N/A 08/18/2013   Procedure: COLONOSCOPY;  Surgeon: Winfield Cunas., MD;  Location: [facility];  Service: Endoscopy;   Laterality: N/A;  . HERNIA REPAIR     2009   Social History   Social History Narrative  . No narrative on file     Objective: Vital Signs: There were no vitals taken for this visit.   Physical Exam   Musculoskeletal Exam: ***  CDAI Exam: No CDAI exam completed.    Investigation: No additional findings. Orders Only on 09/28/2016  Component Date Value Ref Range Status  . WBC 09/28/2016 11.3* 3.8 - 10.8 K/uL Final  . RBC 09/28/2016 4.37  4.20 - 5.80 MIL/uL Final  . Hemoglobin 09/28/2016 10.4* 13.2 - 17.1 g/dL Final  . HCT 09/28/2016 33.1* 38.5 - 50.0 % Final  . MCV 09/28/2016 75.7* 80.0 - 100.0 fL Final  . MCH 09/28/2016 23.8* 27.0 - 33.0 pg Final  . MCHC 09/28/2016 31.4* 32.0 - 36.0 g/dL Final  . RDW 09/28/2016 20.8* 11.0 - 15.0 % Final  . Platelets 09/28/2016 579* 140 - 400 K/uL Final  . MPV 09/28/2016 9.4  7.5 - 12.5 fL Final  . Neutro Abs 09/28/2016 7910* 1,500 - 7,800 cells/uL Final  . Lymphs Abs 09/28/2016 1808  850 - 3,900 cells/uL Final  . Monocytes Absolute 09/28/2016 1017* 200 - 950 cells/uL Final  . Eosinophils Absolute 09/28/2016 452  15 - 500 cells/uL Final  . Basophils Absolute 09/28/2016 113  0 - 200 cells/uL Final  . Neutrophils Relative % 09/28/2016 70  % Final  . Lymphocytes Relative 09/28/2016 16  % Final  . Monocytes Relative 09/28/2016 9  % Final  . Eosinophils Relative 09/28/2016 4  %  Final  . Basophils Relative 09/28/2016 1  % Final  . Smear Review 09/28/2016 Criteria for review not met   Final  . Sodium 09/28/2016 139  135 - 146 mmol/L Final  . Potassium 09/28/2016 4.2  3.5 - 5.3 mmol/L Final  . Chloride 09/28/2016 104  98 - 110 mmol/L Final  . CO2 09/28/2016 26  20 - 31 mmol/L Final  . Glucose, Bld 09/28/2016 88  65 - 99 mg/dL Final  . BUN 09/28/2016 13  7 - 25 mg/dL Final  . Creat 09/28/2016 1.05  0.70 - 1.33 mg/dL Final   Comment:   For patients > or = 55 years of age: The upper reference limit for Creatinine is approximately 13% higher  for people identified as African-American.     . Total Bilirubin 09/28/2016 0.3  0.2 - 1.2 mg/dL Final  . Alkaline Phosphatase 09/28/2016 63  40 - 115 U/L Final  . AST 09/28/2016 12  10 - 35 U/L Final  . ALT 09/28/2016 8* 9 - 46 U/L Final  . Total Protein 09/28/2016 6.9  6.1 - 8.1 g/dL Final  . Albumin 09/28/2016 4.0  3.6 - 5.1 g/dL Final  . Calcium 09/28/2016 8.9  8.6 - 10.3 mg/dL Final  . GFR, Est African American 09/28/2016 >89  >=60 mL/min Final  . GFR, Est Non African American 09/28/2016 80  >=60 mL/min Final  Orders Only on 07/20/2016  Component Date Value Ref Range Status  . WBC 07/20/2016 9.1  3.8 - 10.8 K/uL Final  . RBC 07/20/2016 4.65  4.20 - 5.80 MIL/uL Final  . Hemoglobin 07/20/2016 10.7* 13.2 - 17.1 g/dL Final  . HCT 07/20/2016 34.0* 38.5 - 50.0 % Final  . MCV 07/20/2016 73.1* 80.0 - 100.0 fL Final  . MCH 07/20/2016 23.0* 27.0 - 33.0 pg Final  . MCHC 07/20/2016 31.5* 32.0 - 36.0 g/dL Final  . RDW 07/20/2016 18.4* 11.0 - 15.0 % Final  . Platelets 07/20/2016 453* 140 - 400 K/uL Final  . MPV 07/20/2016 9.6  7.5 - 12.5 fL Final  . Neutro Abs 07/20/2016 6461  1,500 - 7,800 cells/uL Final  . Lymphs Abs 07/20/2016 1638  850 - 3,900 cells/uL Final  . Monocytes Absolute 07/20/2016 819  200 - 950 cells/uL Final  . Eosinophils Absolute 07/20/2016 182  15 - 500 cells/uL Final  . Basophils Absolute 07/20/2016 0  0 - 200 cells/uL Final  . Neutrophils Relative % 07/20/2016 71  % Final  . Lymphocytes Relative 07/20/2016 18  % Final  . Monocytes Relative 07/20/2016 9  % Final  . Eosinophils Relative 07/20/2016 2  % Final  . Basophils Relative 07/20/2016 0  % Final  . Smear Review 07/20/2016 Criteria for review not met   Final  . Sodium 07/20/2016 139  135 - 146 mmol/L Final  . Potassium 07/20/2016 4.5  3.5 - 5.3 mmol/L Final  . Chloride 07/20/2016 102  98 - 110 mmol/L Final  . CO2 07/20/2016 26  20 - 31 mmol/L Final  . Glucose, Bld 07/20/2016 72  65 - 99 mg/dL Final  . BUN  07/20/2016 11  7 - 25 mg/dL Final  . Creat 07/20/2016 1.23  0.70 - 1.33 mg/dL Final   Comment:   For patients > or = 55 years of age: The upper reference limit for Creatinine is approximately 13% higher for people identified as African-American.     . Total Bilirubin 07/20/2016 0.3  0.2 - 1.2 mg/dL Final  . Alkaline Phosphatase 07/20/2016  60  40 - 115 U/L Final  . AST 07/20/2016 15  10 - 35 U/L Final  . ALT 07/20/2016 14  9 - 46 U/L Final  . Total Protein 07/20/2016 6.6  6.1 - 8.1 g/dL Final  . Albumin 07/20/2016 3.8  3.6 - 5.1 g/dL Final  . Calcium 07/20/2016 8.5* 8.6 - 10.3 mg/dL Final  . GFR, Est African American 07/20/2016 76  >=60 mL/min Final  . GFR, Est Non African American 07/20/2016 66  >=60 mL/min Final     Imaging: No results found.  Speciality Comments: No specialty comments available.    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 Collene Mares  High risk medication use - On Humira 40 mg subcutaneous every other week, methotrexate, folic acid  Arthralgia of both hands  Pain, neck  Normocytic anemia    Orders: No orders of the defined types were placed in this encounter.  No orders of the defined types were placed in this encounter.   Face-to-face time spent with patient was *** minutes. 50% of time was spent in counseling and coordination of care.  Follow-Up Instructions: No Follow-up on file.   Bo Merino, MD  Note - This record has been created using Editor, commissioning.  Chart creation errors have been sought, but may not always  have been located. Such creation errors do not reflect on  the standard of medical care.

## 2016-10-10 ENCOUNTER — Telehealth: Payer: Self-pay | Admitting: Rheumatology

## 2016-10-10 NOTE — Telephone Encounter (Signed)
Patient called to check the status of his injections.  CB#[phone].  Thank you.

## 2016-10-10 NOTE — Telephone Encounter (Signed)
Patient states he is taking his second dose on 0.8 mL of MTX tomorrow. Patient wants to know when he is supposed to come in for labs. Advised patient he will need to come in next week for labs. If labs are okay he will increase MTX to 1 mL

## 2016-10-15 ENCOUNTER — Ambulatory Visit: Payer: PRIVATE HEALTH INSURANCE | Admitting: Rheumatology

## 2016-10-19 ENCOUNTER — Other Ambulatory Visit: Payer: Self-pay | Admitting: Pharmacist

## 2016-10-19 DIAGNOSIS — Z79899 Other long term (current) drug therapy: Secondary | ICD-10-CM

## 2016-10-19 LAB — CBC WITH DIFFERENTIAL/PLATELET
BASOS PCT: 1 %
Basophils Absolute: 112 cells/uL (ref 0–200)
Eosinophils Absolute: 448 cells/uL (ref 15–500)
Eosinophils Relative: 4 %
HCT: 34.1 % — ABNORMAL LOW (ref 38.5–50.0)
Hemoglobin: 10.6 g/dL — ABNORMAL LOW (ref 13.2–17.1)
LYMPHS PCT: 16 %
Lymphs Abs: 1792 cells/uL (ref 850–3900)
MCH: 23.9 pg — ABNORMAL LOW (ref 27.0–33.0)
MCHC: 31.1 g/dL — ABNORMAL LOW (ref 32.0–36.0)
MCV: 77 fL — ABNORMAL LOW (ref 80.0–100.0)
MONOS PCT: 6 %
MPV: 9.5 fL (ref 7.5–12.5)
Monocytes Absolute: 672 cells/uL (ref 200–950)
NEUTROS ABS: 8176 {cells}/uL — AB (ref 1500–7800)
Neutrophils Relative %: 73 %
PLATELETS: 478 10*3/uL — AB (ref 140–400)
RBC: 4.43 MIL/uL (ref 4.20–5.80)
RDW: 20.3 % — AB (ref 11.0–15.0)
WBC: 11.2 10*3/uL — AB (ref 3.8–10.8)

## 2016-10-20 LAB — COMPLETE METABOLIC PANEL WITH GFR
AG Ratio: 1.5 Ratio (ref 1.0–2.5)
ALBUMIN: 4.1 g/dL (ref 3.6–5.1)
ALT: 9 U/L (ref 9–46)
AST: 12 U/L (ref 10–35)
Alkaline Phosphatase: 62 U/L (ref 40–115)
BUN/Creatinine Ratio: 10.7 Ratio (ref 6–22)
BUN: 12 mg/dL (ref 7–25)
CHLORIDE: 102 mmol/L (ref 98–110)
CO2: 26 mmol/L (ref 20–31)
Calcium: 9.1 mg/dL (ref 8.6–10.3)
Creat: 1.12 mg/dL (ref 0.70–1.33)
GFR, Est African American: 86 mL/min (ref >=60)
GFR, Est Non African American: 74 mL/min (ref >=60)
GLUCOSE: 109 mg/dL — AB (ref 65–99)
Globulin: 2.7 g/dL (ref 1.9–3.7)
POTASSIUM: 4.3 mmol/L (ref 3.5–5.3)
Sodium: 140 mmol/L (ref 135–146)
TOTAL PROTEIN: 6.8 g/dL (ref 6.1–8.1)
Total Bilirubin: 0.4 mg/dL (ref 0.2–1.2)

## 2016-10-20 NOTE — Progress Notes (Signed)
Labs are stable. If he is on 0.8 ML off methotrexate then he can increase it to 1.0 mL of methotrexate and recheck labs in 2 weeks then every 2 months to monitor for drug toxicity.

## 2016-10-22 ENCOUNTER — Telehealth: Payer: Self-pay | Admitting: Radiology

## 2016-10-22 NOTE — Telephone Encounter (Signed)
-----   Message from Bo Merino, MD sent at 10/20/2016  9:11 PM EDT ----- Labs are stable. If he is on 0.8 ML off methotrexate then he can increase it to 1.0 mL of methotrexate and recheck labs in 2 weeks then every 2 months to monitor for drug toxicity.

## 2016-10-22 NOTE — Telephone Encounter (Signed)
Called patient to advise. He has voiced understanding.

## 2016-11-08 NOTE — Progress Notes (Deleted)
Office Visit Note  Patient: Kenneth Payne             Date of Birth: 03/24/1961           MRN: [id number]             PCP: Wenda Low, MD Referring: Wenda Low, MD Visit Date: 11/16/2016 Occupation: @GUAROCC @    Subjective:  No chief complaint on file.   History of Present Illness: Kenneth Payne is a 56 y.o. male ***   Activities of Daily Living:  Patient reports morning stiffness for *** {minute/hour:19697}.   Patient {ACTIONS;DENIES/REPORTS:21021675::"Denies"} nocturnal pain.  Difficulty dressing/grooming: {ACTIONS;DENIES/REPORTS:21021675::"Denies"} Difficulty climbing stairs: {ACTIONS;DENIES/REPORTS:21021675::"Denies"} Difficulty getting out of chair: {ACTIONS;DENIES/REPORTS:21021675::"Denies"} Difficulty using hands for taps, buttons, cutlery, and/or writing: {ACTIONS;DENIES/REPORTS:21021675::"Denies"}   No Rheumatology ROS completed.   PMFS History:  Patient Active Problem List   Diagnosis Date Noted  . High risk medication use 05/25/2016  . Pain, neck 05/25/2016  . Arthralgia of both hands 05/25/2016  . Pain of both sacroiliac joints 05/25/2016  . Neck mass   . Thrombocytosis ([place]) 08/20/2013  . Normocytic anemia 08/19/2013  . Ulcerative colitis ([place]) 08/16/2013  . Ulcerative colitis, acute ([place]) 08/16/2013  . History of leukocytosis 08/16/2013    Past Medical History:  Diagnosis Date  . Rheumatoid arthritis ([place])   . Ulcerative colitis ([place])     Family History  Problem Relation Age of Onset  . Asthma Mother   . Stroke Mother   . Hypertension Mother   . Parkinson's disease Father   . Hypertension Father   . Heart attack Maternal Grandmother   . Heart attack Maternal Grandfather   . Stroke Paternal Grandmother   . Heart attack Paternal Grandfather    Past Surgical History:  Procedure Laterality Date  . COLONOSCOPY N/A 08/18/2013   Procedure: COLONOSCOPY;  Surgeon: Winfield Cunas., MD;  Location: [facility];  Service: Endoscopy;   Laterality: N/A;  . HERNIA REPAIR     2009   Social History   Social History Narrative  . No narrative on file     Objective: Vital Signs: There were no vitals taken for this visit.   Physical Exam   Musculoskeletal Exam: ***  CDAI Exam: No CDAI exam completed.    Investigation: Findings:  05/25/2016 hepatitis panel negative, HIV negative, 06/07/2016 negative TB gold   CBC Latest Ref Rng & Units 10/19/2016 09/28/2016 07/20/2016  WBC 3.8 - 10.8 K/uL 11.2(H) 11.3(H) 9.1  Hemoglobin 13.2 - 17.1 g/dL 10.6(L) 10.4(L) 10.7(L)  Hematocrit 38.5 - 50.0 % 34.1(L) 33.1(L) 34.0(L)  Platelets 140 - 400 K/uL 478(H) 579(H) 453(H)     CMP Latest Ref Rng & Units 10/19/2016 09/28/2016 07/20/2016  Glucose 65 - 99 mg/dL 109(H) 88 72  BUN 7 - 25 mg/dL 12 13 11   Creatinine 0.70 - 1.33 mg/dL 1.12 1.05 1.23  Sodium 135 - 146 mmol/L 140 139 139  Potassium 3.5 - 5.3 mmol/L 4.3 4.2 4.5  Chloride 98 - 110 mmol/L 102 104 102  CO2 20 - 31 mmol/L 26 26 26   Calcium 8.6 - 10.3 mg/dL 9.1 8.9 8.5(L)  Total Protein 6.1 - 8.1 g/dL 6.8 6.9 6.6  Total Bilirubin 0.2 - 1.2 mg/dL 0.4 0.3 0.3  Alkaline Phos 40 - 115 U/L 62 63 60  AST 10 - 35 U/L 12 12 15   ALT 9 - 46 U/L 9 8(L) 14     Imaging: No results found.  Speciality Comments: No specialty comments available.  Procedures:  No procedures performed Allergies: Augmentin [amoxicillin-pot clavulanate] and Flagyl [metronidazole]   Assessment / Plan:     Visit Diagnoses: Ulcerative rectosigmoiditis with rectal bleeding (HCC)  High risk medication use  Arthralgia of both hands  Pain of both sacroiliac joints  Thrombocytosis (HCC)  Normocytic anemia  History of leukocytosis  Pain, neck    Orders: No orders of the defined types were placed in this encounter.  No orders of the defined types were placed in this encounter.   Face-to-face time spent with patient was *** minutes. 50% of time was spent in counseling and coordination of  care.  Follow-Up Instructions: No Follow-up on file.   Amy Littrell, RT  Note - This record has been created using [employer].  Chart creation errors have been sought, but may not always  have been located. Such creation errors do not reflect on  the standard of medical care.

## 2016-11-16 ENCOUNTER — Ambulatory Visit: Payer: PRIVATE HEALTH INSURANCE | Admitting: Rheumatology

## 2016-11-19 NOTE — Progress Notes (Deleted)
   Office Visit Note  Patient: Kenneth Payne             Date of Birth: 02-23-1961           MRN: [id number]             PCP: Wenda Low, MD Referring: Wenda Low, MD Visit Date: 11/22/2016 Occupation: @GUAROCC @    Subjective:  No chief complaint on file.   History of Present Illness: Kenneth Payne is a 56 y.o. male ***   Activities of Daily Living:  Patient reports morning stiffness for *** {minute/hour:19697}.   Patient {ACTIONS;DENIES/REPORTS:21021675::"Denies"} nocturnal pain.  Difficulty dressing/grooming: {ACTIONS;DENIES/REPORTS:21021675::"Denies"} Difficulty climbing stairs: {ACTIONS;DENIES/REPORTS:21021675::"Denies"} Difficulty getting out of chair: {ACTIONS;DENIES/REPORTS:21021675::"Denies"} Difficulty using hands for taps, buttons, cutlery, and/or writing: {ACTIONS;DENIES/REPORTS:21021675::"Denies"}   No Rheumatology ROS completed.   PMFS History:  Patient Active Problem List   Diagnosis Date Noted  . High risk medication use 05/25/2016  . Pain, neck 05/25/2016  . Arthralgia of both hands 05/25/2016  . Pain of both sacroiliac joints 05/25/2016  . Neck mass   . Thrombocytosis ([place]) 08/20/2013  . Normocytic anemia 08/19/2013  . Ulcerative colitis ([place]) 08/16/2013  . Ulcerative colitis, acute ([place]) 08/16/2013  . History of leukocytosis 08/16/2013    Past Medical History:  Diagnosis Date  . Rheumatoid arthritis ([place])   . Ulcerative colitis ([place])     Family History  Problem Relation Age of Onset  . Asthma Mother   . Stroke Mother   . Hypertension Mother   . Parkinson's disease Father   . Hypertension Father   . Heart attack Maternal Grandmother   . Heart attack Maternal Grandfather   . Stroke Paternal Grandmother   . Heart attack Paternal Grandfather    Past Surgical History:  Procedure Laterality Date  . COLONOSCOPY N/A 08/18/2013   Procedure: COLONOSCOPY;  Surgeon: Winfield Cunas., MD;  Location: [facility];  Service: Endoscopy;   Laterality: N/A;  . HERNIA REPAIR     2009   Social History   Social History Narrative  . No narrative on file     Objective: Vital Signs: There were no vitals taken for this visit.   Physical Exam   Musculoskeletal Exam: ***  CDAI Exam: No CDAI exam completed.    Investigation: No additional findings. 10/19/2016 every BC 11.2 hemoglobin 10.6 platelets 478, CMP normal  Imaging: No results found.  Speciality Comments: No specialty comments available.    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 Collene Mares  High risk medication use - Humira 40 mg subcutaneous every other week, methotrexate 8 tablets by mouth every week, folic acid 2 mg by mouth daily  Sacroiliitis (HCC)  Pain, neck    Orders: No orders of the defined types were placed in this encounter.  No orders of the defined types were placed in this encounter.   Face-to-face time spent with patient was *** minutes. 50% of time was spent in counseling and coordination of care.  Follow-Up Instructions: No Follow-up on file.   Bo Merino, MD  Note - This record has been created using Editor, commissioning.  Chart creation errors have been sought, but may not always  have been located. Such creation errors do not reflect on  the standard of medical care.

## 2016-11-22 ENCOUNTER — Ambulatory Visit: Payer: PRIVATE HEALTH INSURANCE | Admitting: Rheumatology

## 2016-12-07 ENCOUNTER — Other Ambulatory Visit: Payer: Self-pay | Admitting: Rheumatology

## 2016-12-10 NOTE — Telephone Encounter (Signed)
Last Visit:07/10/16 Next Visit: 12/27/16 Labs: 10/19/16 stable  Okay to refill MTX?

## 2016-12-10 NOTE — Telephone Encounter (Signed)
ok 

## 2016-12-24 NOTE — Progress Notes (Signed)
Office Visit Note  Patient: Kenneth Payne             Date of Birth: 1960/09/22           MRN: [id number]             PCP: Wenda Low, MD Referring: Wenda Low, MD Visit Date: 12/27/2016 Occupation: @GUAROCC @    Subjective:  Joint stiffness   History of Present Illness: Kenneth Payne is a 56 y.o. male with history of ulcerative colitis and inflammatory arthritis. He states he is doing quite well on combination of methotrexate and Humira. He has not had as frequent flares of ulcerative colitis or arthritis. He has minimal stiffness in the morning. His been tolerating his medications well. He has had no discomfort in the SI joints lately. The hand pain is improved as well.  Activities of Daily Living:  Patient reports morning stiffness for 1 minute.   Patient Denies nocturnal pain.  Difficulty dressing/grooming: Denies Difficulty climbing stairs: Denies Difficulty getting out of chair: Denies Difficulty using hands for taps, buttons, cutlery, and/or writing: Denies   Review of Systems  Constitutional: Negative for fatigue, night sweats and weakness ( ).  HENT: Negative for mouth sores, mouth dryness and nose dryness.   Eyes: Negative for redness and dryness.  Respiratory: Negative for shortness of breath and difficulty breathing.   Cardiovascular: Negative for chest pain, palpitations, hypertension, irregular heartbeat and swelling in legs/feet.  Gastrointestinal: Negative for constipation and diarrhea.  Endocrine: Negative for increased urination.  Musculoskeletal: Positive for morning stiffness. Negative for arthralgias, joint pain, joint swelling, myalgias, muscle weakness, muscle tenderness and myalgias.  Skin: Negative for color change, rash, hair loss, nodules/bumps, skin tightness, ulcers and sensitivity to sunlight.  Allergic/Immunologic: Negative for susceptible to infections.  Neurological: Negative for dizziness, fainting, memory loss and night sweats.    Hematological: Negative for swollen glands.  Psychiatric/Behavioral: Negative for depressed mood and sleep disturbance. The patient is not nervous/anxious.     PMFS History:  Patient Active Problem List   Diagnosis Date Noted  . High risk medication use 05/25/2016  . Pain, neck 05/25/2016  . Arthralgia of both hands 05/25/2016  . Pain of both sacroiliac joints 05/25/2016  . Neck mass   . Thrombocytosis ([place]) 08/20/2013  . Normocytic anemia 08/19/2013  . Ulcerative colitis ([place]) 08/16/2013  . Ulcerative colitis, acute ([place]) 08/16/2013  . History of leukocytosis 08/16/2013    Past Medical History:  Diagnosis Date  . Rheumatoid arthritis ([place])   . Ulcerative colitis ([place])     Family History  Problem Relation Age of Onset  . Asthma Mother   . Stroke Mother   . Hypertension Mother   . Parkinson's disease Father   . Hypertension Father   . Heart attack Maternal Grandmother   . Heart attack Maternal Grandfather   . Stroke Paternal Grandmother   . Heart attack Paternal Grandfather    Past Surgical History:  Procedure Laterality Date  . COLONOSCOPY N/A 08/18/2013   Procedure: COLONOSCOPY;  Surgeon: Winfield Cunas., MD;  Location: [facility];  Service: Endoscopy;  Laterality: N/A;  . HERNIA REPAIR     2009   Social History   Social History Narrative  . No narrative on file     Objective: Vital Signs: BP 135/88   Pulse 79   Resp 14   Ht 5' 7"  (1.702 m)   Wt 170 lb (77.1 kg)   BMI 26.63 kg/m    Physical  Exam  Constitutional: He is oriented to person, place, and time. He appears well-developed and well-nourished.  HENT:  Head: Normocephalic and atraumatic.  Eyes: Conjunctivae and EOM are normal. Pupils are equal, round, and reactive to light.  Neck: Normal range of motion. Neck supple.  Cardiovascular: Normal rate, regular rhythm and normal heart sounds.   Pulmonary/Chest: Effort normal and breath sounds normal.  Abdominal: Soft. Bowel sounds are normal.   Neurological: He is alert and oriented to person, place, and time.  Skin: Skin is warm and dry. Capillary refill takes less than 2 seconds.  Psychiatric: He has a normal mood and affect. His behavior is normal.  Nursing note and vitals reviewed.    Musculoskeletal Exam: C-spine and thoracic lumbar spine good range of motion. Shoulder joints elbow joints wrist joint MCPs PIPs DIPs with good range of motion. Hip joints, knee joints, ankle joints, MTPs PIPs DIPs with good range of motion with no synovitis. He had no SI joint discomfort and had good mobility of his lumbar spine.  CDAI Exam: CDAI Homunculus Exam:   Joint Counts:  CDAI Tender Joint count: 0 CDAI Swollen Joint count: 0  Global Assessments:  Patient Global Assessment: 1 Provider Global Assessment: 1  CDAI Calculated Score: 2    Investigation: No additional findings. CBC Latest Ref Rng & Units 10/19/2016 09/28/2016 07/20/2016  WBC 3.8 - 10.8 K/uL 11.2(H) 11.3(H) 9.1  Hemoglobin 13.2 - 17.1 g/dL 10.6(L) 10.4(L) 10.7(L)  Hematocrit 38.5 - 50.0 % 34.1(L) 33.1(L) 34.0(L)  Platelets 140 - 400 K/uL 478(H) 579(H) 453(H)    CMP Latest Ref Rng & Units 10/19/2016 09/28/2016 07/20/2016  Glucose 65 - 99 mg/dL 109(H) 88 72  BUN 7 - 25 mg/dL 12 13 11   Creatinine 0.70 - 1.33 mg/dL 1.12 1.05 1.23  Sodium 135 - 146 mmol/L 140 139 139  Potassium 3.5 - 5.3 mmol/L 4.3 4.2 4.5  Chloride 98 - 110 mmol/L 102 104 102  CO2 20 - 31 mmol/L 26 26 26   Calcium 8.6 - 10.3 mg/dL 9.1 8.9 8.5(L)  Total Protein 6.1 - 8.1 g/dL 6.8 6.9 6.6  Total Bilirubin 0.2 - 1.2 mg/dL 0.4 0.3 0.3  Alkaline Phos 40 - 115 U/L 62 63 60  AST 10 - 35 U/L 12 12 15   ALT 9 - 46 U/L 9 8(L) 14    Imaging: No results found.  Speciality Comments: No specialty comments available.    Procedures:  No procedures performed Allergies: Augmentin [amoxicillin-pot clavulanate] and Flagyl [metronidazole]   Assessment / Plan:     Visit Diagnoses: Ulcerative Colitis  - fu by  Dr. Collene Mares. Patient states that he has not had any flares in a while.  Inflammatory arthritis - arthralgias and sacroileitis resolved. He is doing quite well on combination of methotrexate and Humira.  High risk medication use - Methotrexate 0.8 ml sq q wk, Folic acid 2 mg po qd/ Humira 87m po q o wk (prescribed by Dr MCollene Mares. His labs have been stable. We will check his labs again in July and then every 3 months. As he is on Humira I will also check his TB gold with his next labs. He is taken prednisone in the past I will check his vitamin D level as well.  Pain of both sacroiliac joints: Resolved  Arthralgia of both hands: Resolved  DJD (degenerative joint disease), cervical - mild: Currently not symptomatic  History of leukocytosis: Improved off prednisone  Thrombocytosis (HCC) mildly elevated  Normocytic anemia : He continues to  be anemic have advised him to discuss this further with Dr. Collene Mares.   Orders: Orders Placed This Encounter  Procedures  . Quantiferon tb gold assay (blood)  . VITAMIN D 25 Hydroxy (Vit-D Deficiency, Fractures)   No orders of the defined types were placed in this encounter.   Face-to-face time spent with patient was 30 minutes. 50% of time was spent in counseling and coordination of care.  Follow-Up Instructions: Return in about 5 months (around 05/29/2017) for UC, inflammatory arthritis.   Bo Merino, MD  Note - This record has been created using Editor, commissioning.  Chart creation errors have been sought, but may not always  have been located. Such creation errors do not reflect on  the standard of medical care.

## 2016-12-27 ENCOUNTER — Ambulatory Visit (INDEPENDENT_AMBULATORY_CARE_PROVIDER_SITE_OTHER): Payer: PRIVATE HEALTH INSURANCE | Admitting: Rheumatology

## 2016-12-27 ENCOUNTER — Encounter: Payer: Self-pay | Admitting: Rheumatology

## 2016-12-27 VITALS — BP 135/88 | HR 79 | Resp 14 | Ht 67.0 in | Wt 170.0 lb

## 2016-12-27 DIAGNOSIS — Z79899 Other long term (current) drug therapy: Secondary | ICD-10-CM | POA: Diagnosis not present

## 2016-12-27 DIAGNOSIS — M1990 Unspecified osteoarthritis, unspecified site: Secondary | ICD-10-CM

## 2016-12-27 DIAGNOSIS — M25541 Pain in joints of right hand: Secondary | ICD-10-CM

## 2016-12-27 DIAGNOSIS — K51311 Ulcerative (chronic) rectosigmoiditis with rectal bleeding: Secondary | ICD-10-CM

## 2016-12-27 DIAGNOSIS — D649 Anemia, unspecified: Secondary | ICD-10-CM

## 2016-12-27 DIAGNOSIS — M5030 Other cervical disc degeneration, unspecified cervical region: Secondary | ICD-10-CM | POA: Diagnosis not present

## 2016-12-27 DIAGNOSIS — Z1321 Encounter for screening for nutritional disorder: Secondary | ICD-10-CM | POA: Diagnosis not present

## 2016-12-27 DIAGNOSIS — Z862 Personal history of diseases of the blood and blood-forming organs and certain disorders involving the immune mechanism: Secondary | ICD-10-CM

## 2016-12-27 DIAGNOSIS — D473 Essential (hemorrhagic) thrombocythemia: Secondary | ICD-10-CM

## 2016-12-27 DIAGNOSIS — M47812 Spondylosis without myelopathy or radiculopathy, cervical region: Secondary | ICD-10-CM

## 2016-12-27 DIAGNOSIS — D75839 Thrombocytosis, unspecified: Secondary | ICD-10-CM

## 2016-12-27 DIAGNOSIS — M25542 Pain in joints of left hand: Secondary | ICD-10-CM

## 2016-12-27 NOTE — Patient Instructions (Signed)
Standing Labs We placed an order today for your standing lab work.    Please come back and get your standing labs in July and every 3 months TB gold with next lab  We have open lab Monday through Friday from 8:30-11:30 AM and 1:30-4 PM at the office of Dr. Bo Merino.   The office is located at [address], [place], [place], [place] [address] No appointment is necessary.   Labs are drawn by [facility].  You may receive a bill from [facility] for your lab work. If you have any questions regarding directions or hours of operation,  please call [phone].

## 2017-02-11 ENCOUNTER — Other Ambulatory Visit: Payer: Self-pay | Admitting: Rheumatology

## 2017-02-12 NOTE — Telephone Encounter (Signed)
12/27/16 last visit 05/30/17 next visit    CMP Latest Ref Rng & Units 10/19/2016 09/28/2016 07/20/2016  Glucose 65 - 99 mg/dL 109(H) 88 72  BUN 7 - 25 mg/dL 12 13 11   Creatinine 0.70 - 1.33 mg/dL 1.12 1.05 1.23  Sodium 135 - 146 mmol/L 140 139 139  Potassium 3.5 - 5.3 mmol/L 4.3 4.2 4.5  Chloride 98 - 110 mmol/L 102 104 102  CO2 20 - 31 mmol/L 26 26 26   Calcium 8.6 - 10.3 mg/dL 9.1 8.9 8.5(L)  Total Protein 6.1 - 8.1 g/dL 6.8 6.9 6.6  Total Bilirubin 0.2 - 1.2 mg/dL 0.4 0.3 0.3  Alkaline Phos 40 - 115 U/L 62 63 60  AST 10 - 35 U/L 12 12 15   ALT 9 - 46 U/L 9 8(L) 14   CBC Latest Ref Rng & Units 10/19/2016 09/28/2016 07/20/2016  WBC 3.8 - 10.8 K/uL 11.2(H) 11.3(H) 9.1  Hemoglobin 13.2 - 17.1 g/dL 10.6(L) 10.4(L) 10.7(L)  Hematocrit 38.5 - 50.0 % 34.1(L) 33.1(L) 34.0(L)  Platelets 140 - 400 K/uL 478(H) 579(H) 453(H)   Labs past due called patient to advise pt states he will come in next week wants to know if we can refill this without labs this week, he states he is out of town currently

## 2017-02-12 NOTE — Telephone Encounter (Signed)
Ok to give 30d. He needs labs

## 2017-02-13 ENCOUNTER — Other Ambulatory Visit: Payer: Self-pay | Admitting: *Deleted

## 2017-02-13 DIAGNOSIS — Z79899 Other long term (current) drug therapy: Secondary | ICD-10-CM

## 2017-02-13 LAB — CBC WITH DIFFERENTIAL/PLATELET
BASOS ABS: 101 {cells}/uL (ref 0–200)
Basophils Relative: 1 %
EOS ABS: 606 {cells}/uL — AB (ref 15–500)
EOS PCT: 6 %
HCT: 35.4 % — ABNORMAL LOW (ref 38.5–50.0)
HEMOGLOBIN: 11 g/dL — AB (ref 13.2–17.1)
LYMPHS ABS: 1919 {cells}/uL (ref 850–3900)
Lymphocytes Relative: 19 %
MCH: 24.8 pg — ABNORMAL LOW (ref 27.0–33.0)
MCHC: 31.1 g/dL — AB (ref 32.0–36.0)
MCV: 79.7 fL — ABNORMAL LOW (ref 80.0–100.0)
MONO ABS: 909 {cells}/uL (ref 200–950)
MPV: 9.3 fL (ref 7.5–12.5)
Monocytes Relative: 9 %
NEUTROS ABS: 6565 {cells}/uL (ref 1500–7800)
NEUTROS PCT: 65 %
Platelets: 512 10*3/uL — ABNORMAL HIGH (ref 140–400)
RBC: 4.44 MIL/uL (ref 4.20–5.80)
RDW: 20.9 % — ABNORMAL HIGH (ref 11.0–15.0)
WBC: 10.1 10*3/uL (ref 3.8–10.8)

## 2017-02-14 LAB — COMPLETE METABOLIC PANEL WITH GFR
ALBUMIN: 4.4 g/dL (ref 3.6–5.1)
ALK PHOS: 68 U/L (ref 40–115)
ALT: 12 U/L (ref 9–46)
AST: 14 U/L (ref 10–35)
BUN: 14 mg/dL (ref 7–25)
CHLORIDE: 104 mmol/L (ref 98–110)
CO2: 26 mmol/L (ref 20–32)
Calcium: 9.5 mg/dL (ref 8.6–10.3)
Creat: 1.12 mg/dL (ref 0.70–1.33)
GFR, Est African American: 86 mL/min (ref >=60)
GFR, Est Non African American: 74 mL/min (ref >=60)
GLUCOSE: 89 mg/dL (ref 65–99)
POTASSIUM: 4.4 mmol/L (ref 3.5–5.3)
SODIUM: 140 mmol/L (ref 135–146)
Total Bilirubin: 0.4 mg/dL (ref 0.2–1.2)
Total Protein: 6.9 g/dL (ref 6.1–8.1)

## 2017-02-14 NOTE — Progress Notes (Signed)
Labs are stable.

## 2017-03-21 ENCOUNTER — Other Ambulatory Visit: Payer: Self-pay | Admitting: Rheumatology

## 2017-03-21 NOTE — Telephone Encounter (Signed)
Last Visit: 12/27/16 Next Visit: 05/30/17 Labs: 02/13/17 Stable  Okay to refill per Dr. Estanislado Pandy

## 2017-04-02 IMAGING — US US BIOPSY
1 series · 8 of 8 positions shown · non-contrast
Comparison: Ultrasound of the neck on 11/29/2014

CLINICAL DATA: Persistent area of palpable right posterior cervical
lymph node prominence with prior nondiagnostic office fine-needle
aspiration procedure.

EXAM:
ULTRASOUND GUIDED CORE BIOPSY OF RIGHT CERVICAL LYMPH NODE
MEDICATIONS:
1.0 mg IV Versed; 50 mcg IV Fentanyl
Total Moderate Sedation Time: 13 minutes

[Series 1: us biopsy · 0.03mm/px · 8 of 8 slices shown]
[im 1/8]
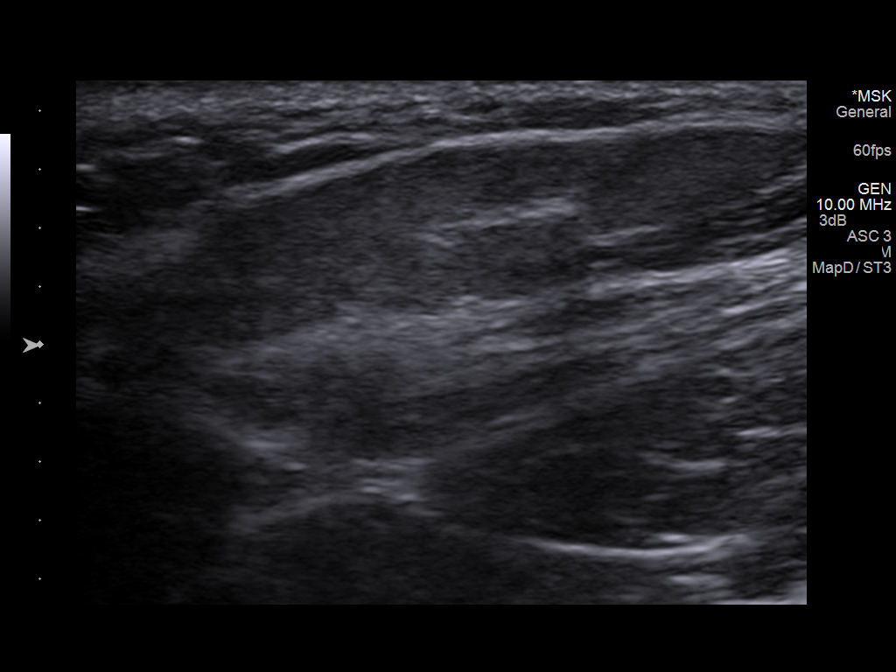
[im 2/8]
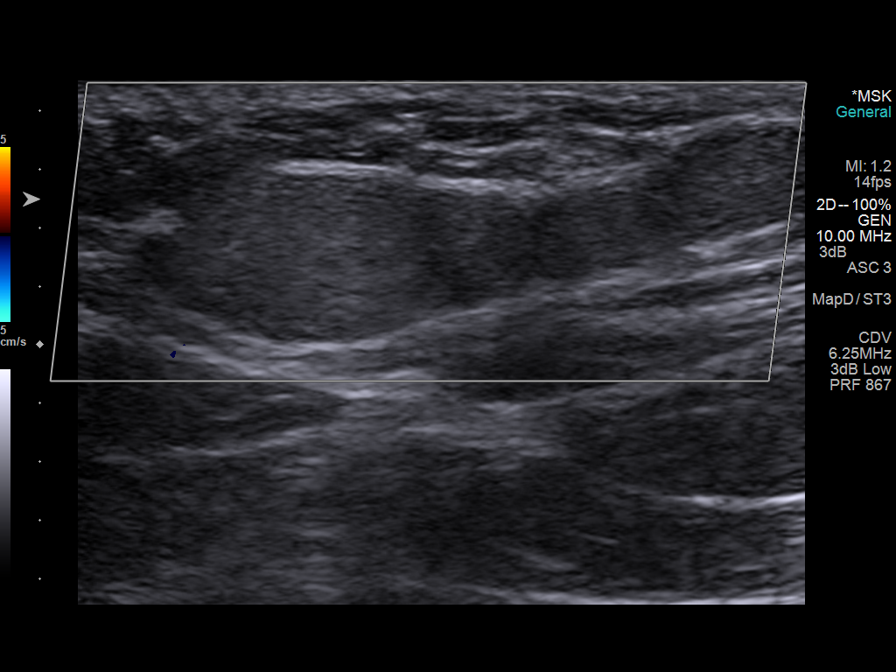
[im 3/8]
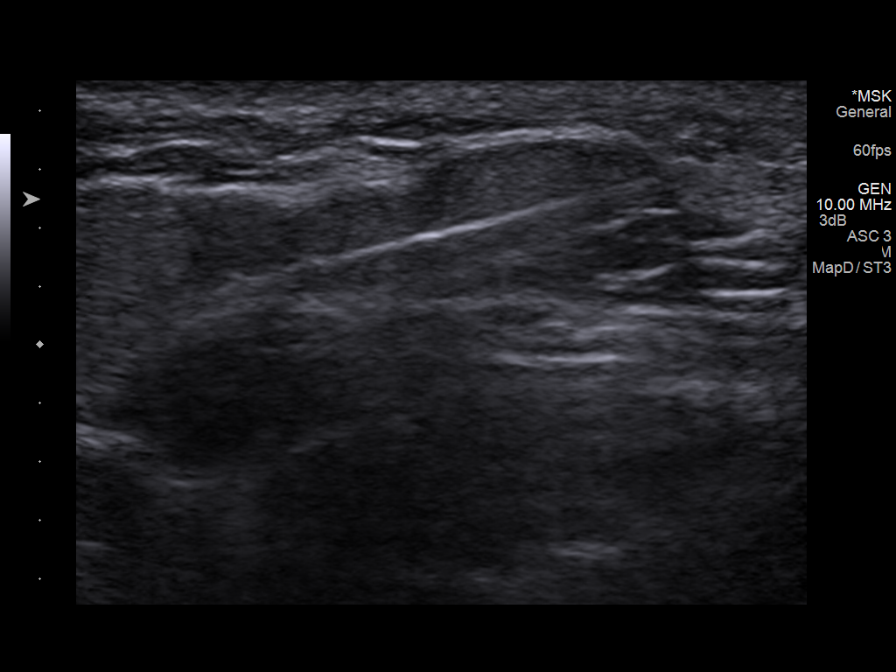
[im 4/8]
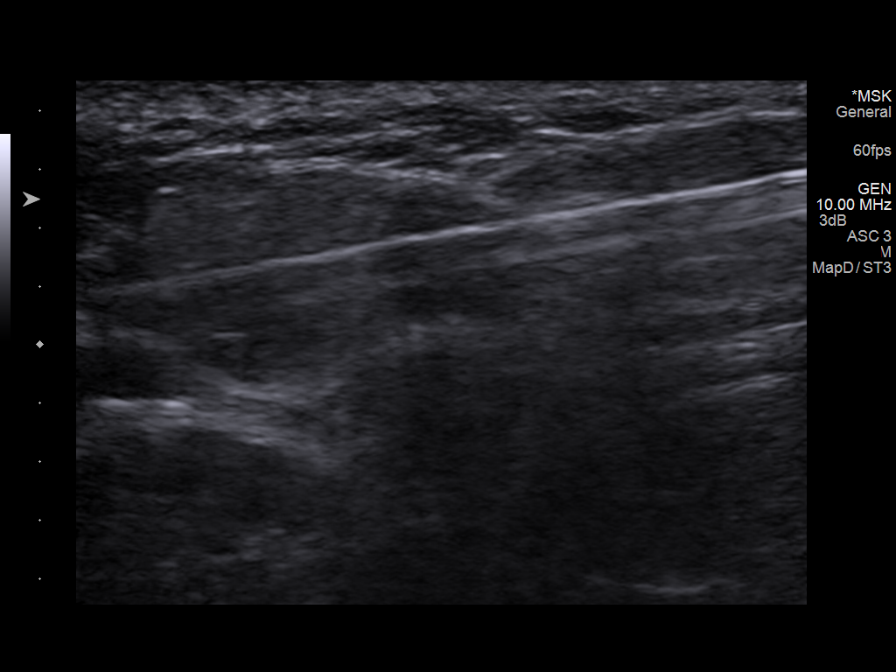
[im 5/8]
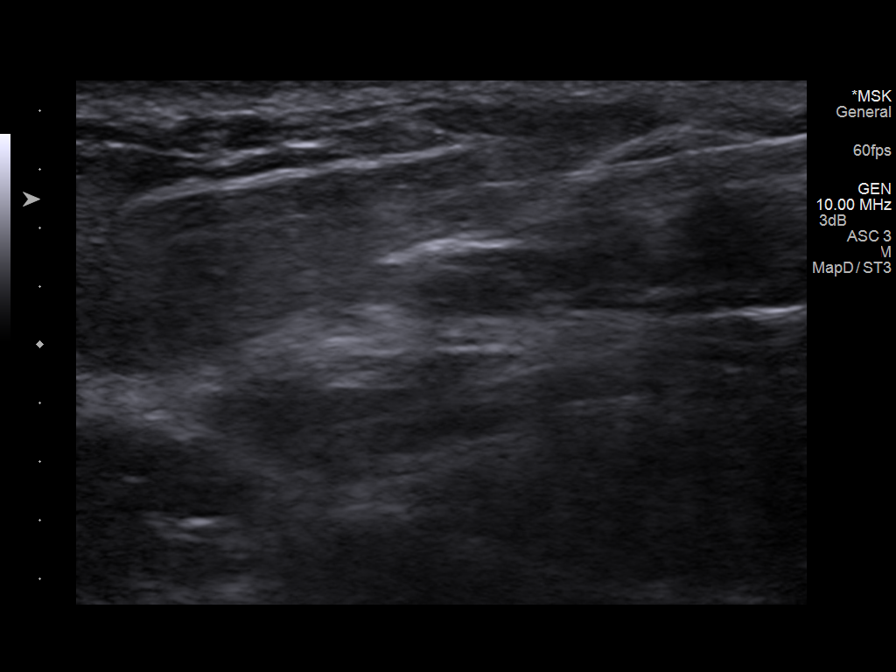
[im 6/8]
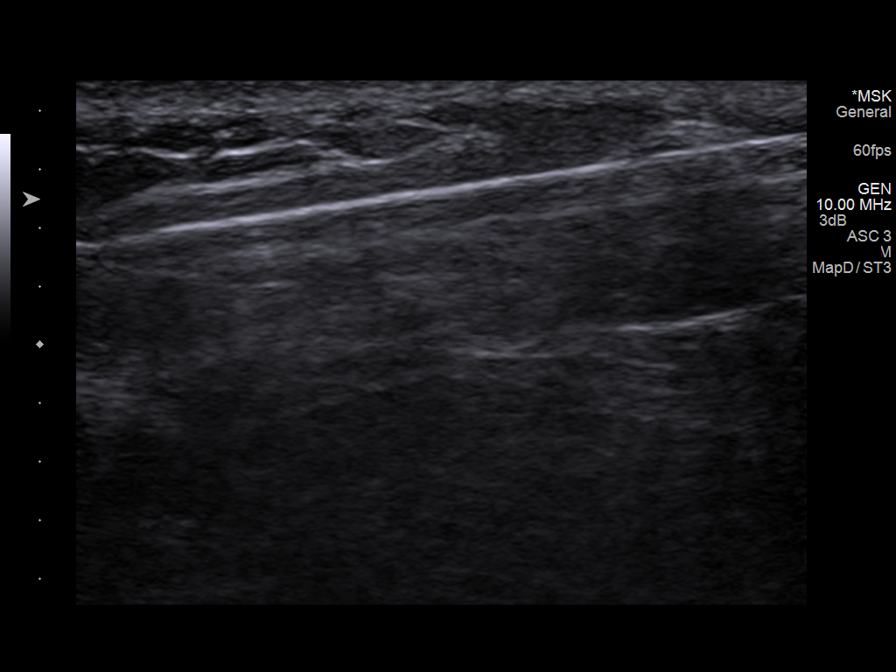
[im 7/8]
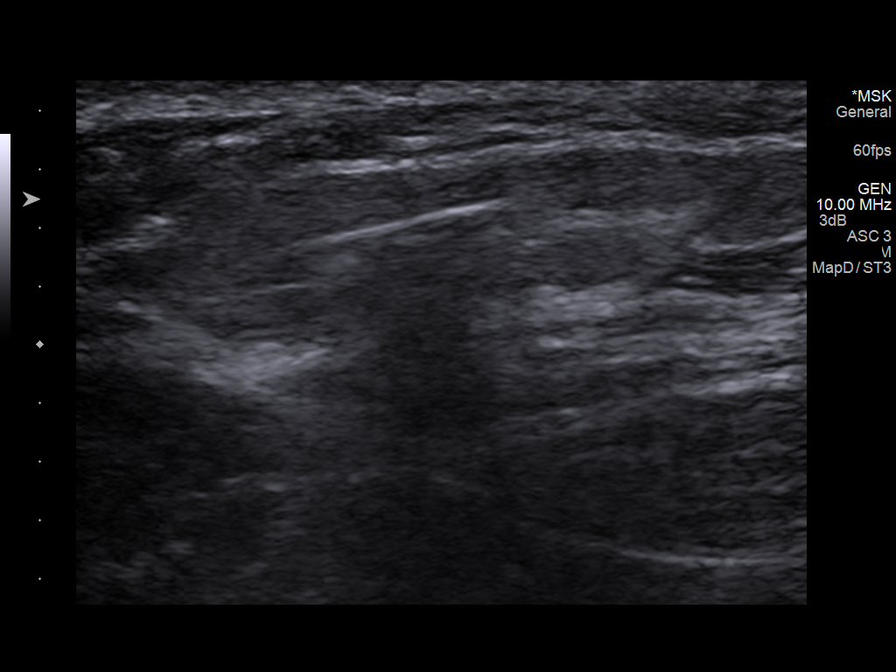
[im 8/8]
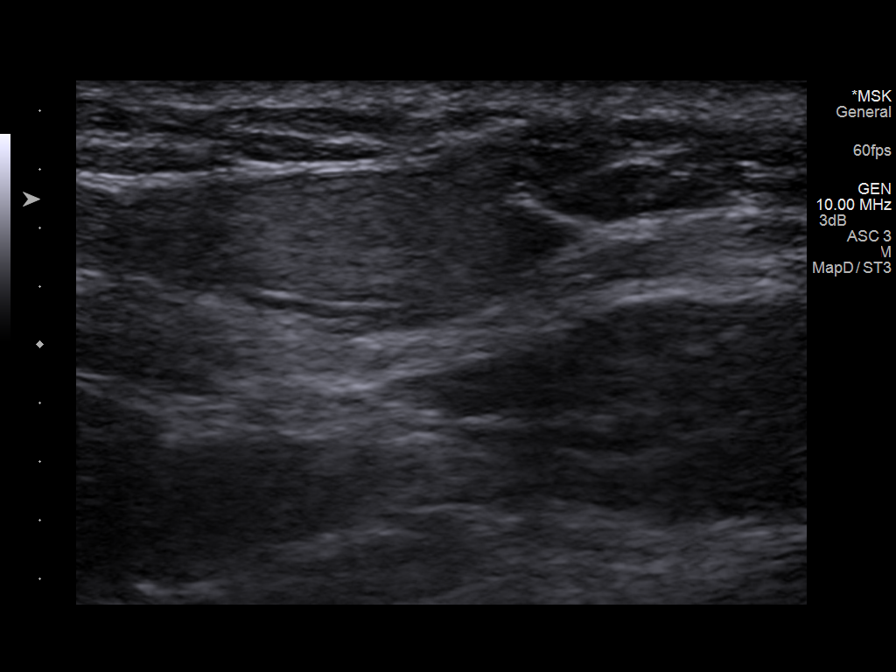

[8 of 8 positions shown; findings below may reference images not displayed]

PROCEDURE:
The procedure, risks, benefits, and alternatives were explained to
the patient. Questions regarding the procedure were encouraged and
answered. The patient understands and consents to the procedure. A
time-out was performed prior to the procedure.

The right neck was prepped with Betadine in a sterile fashion, and a
sterile drape was applied covering the operative field. A sterile
gown and sterile gloves were used for the procedure. Local
anesthesia was provided with 1% Lidocaine.

Right posterior cervical lymph nodes were localized by ultrasound. A
total of four 18 gauge core biopsy samples were obtained. These were
submitted in saline.

COMPLICATIONS:
None.
FINDINGS: Elongated chain of multiple right posterior cervical lymph nodes
identified over a length of roughly 2.5 cm with maximal short axis
dimensions of approximately 0.6 cm. Core biopsy sampling was
performed through these nodes.
IMPRESSION: Ultrasound-guided core biopsy performed of right posterior cervical
lymph nodes.

## 2017-04-17 ENCOUNTER — Other Ambulatory Visit: Payer: Self-pay | Admitting: Rheumatology

## 2017-04-17 NOTE — Telephone Encounter (Signed)
Last Visit: 12/27/16 Next Visit: 05/30/17 Labs: 02/13/17 Stable  Okay to refill per Dr. Estanislado Pandy

## 2017-05-17 NOTE — Progress Notes (Deleted)
Office Visit Note  Patient: Kenneth Payne             Date of Birth: 1961-02-09           MRN: [id number]             PCP: Wenda Low, MD Referring: Wenda Low, MD Visit Date: 05/30/2017 Occupation: @GUAROCC @    Subjective:  No chief complaint on file.   History of Present Illness: Kenneth Payne is a 56 y.o. male ***   Activities of Daily Living:  Patient reports morning stiffness for *** {minute/hour:19697}.   Patient {ACTIONS;DENIES/REPORTS:21021675::"Denies"} nocturnal pain.  Difficulty dressing/grooming: {ACTIONS;DENIES/REPORTS:21021675::"Denies"} Difficulty climbing stairs: {ACTIONS;DENIES/REPORTS:21021675::"Denies"} Difficulty getting out of chair: {ACTIONS;DENIES/REPORTS:21021675::"Denies"} Difficulty using hands for taps, buttons, cutlery, and/or writing: {ACTIONS;DENIES/REPORTS:21021675::"Denies"}   No Rheumatology ROS completed.   PMFS History:  Patient Active Problem List   Diagnosis Date Noted  . High risk medication use 05/25/2016  . Pain, neck 05/25/2016  . Arthralgia of both hands 05/25/2016  . Pain of both sacroiliac joints 05/25/2016  . Neck mass   . Thrombocytosis ([place]) 08/20/2013  . Normocytic anemia 08/19/2013  . Ulcerative colitis ([place]) 08/16/2013  . Ulcerative colitis, acute ([place]) 08/16/2013  . History of leukocytosis 08/16/2013    Past Medical History:  Diagnosis Date  . Rheumatoid arthritis ([place])   . Ulcerative colitis ([place])     Family History  Problem Relation Age of Onset  . Asthma Mother   . Stroke Mother   . Hypertension Mother   . Parkinson's disease Father   . Hypertension Father   . Heart attack Maternal Grandmother   . Heart attack Maternal Grandfather   . Stroke Paternal Grandmother   . Heart attack Paternal Grandfather    Past Surgical History:  Procedure Laterality Date  . COLONOSCOPY N/A 08/18/2013   Procedure: COLONOSCOPY;  Surgeon: Winfield Cunas., MD;  Location: [facility];  Service: Endoscopy;   Laterality: N/A;  . HERNIA REPAIR     2009   Social History   Social History Narrative  . Not on file     Objective: Vital Signs: There were no vitals taken for this visit.   Physical Exam   Musculoskeletal Exam: ***  CDAI Exam: No CDAI exam completed.    Investigation: No additional findings.TB Gold: 06/07/2016 Negative  CBC Latest Ref Rng & Units 02/13/2017 10/19/2016 09/28/2016  WBC 3.8 - 10.8 K/uL 10.1 11.2(H) 11.3(H)  Hemoglobin 13.2 - 17.1 g/dL 11.0(L) 10.6(L) 10.4(L)  Hematocrit 38.5 - 50.0 % 35.4(L) 34.1(L) 33.1(L)  Platelets 140 - 400 K/uL 512(H) 478(H) 579(H)   CMP Latest Ref Rng & Units 02/13/2017 10/19/2016 09/28/2016  Glucose 65 - 99 mg/dL 89 109(H) 88  BUN 7 - 25 mg/dL 14 12 13   Creatinine 0.70 - 1.33 mg/dL 1.12 1.12 1.05  Sodium 135 - 146 mmol/L 140 140 139  Potassium 3.5 - 5.3 mmol/L 4.4 4.3 4.2  Chloride 98 - 110 mmol/L 104 102 104  CO2 20 - 32 mmol/L 26 26 26   Calcium 8.6 - 10.3 mg/dL 9.5 9.1 8.9  Total Protein 6.1 - 8.1 g/dL 6.9 6.8 6.9  Total Bilirubin 0.2 - 1.2 mg/dL 0.4 0.4 0.3  Alkaline Phos 40 - 115 U/L 68 62 63  AST 10 - 35 U/L 14 12 12   ALT 9 - 46 U/L 12 9 8(L)    Imaging: No results found.  Speciality Comments: No specialty comments available.    Procedures:  No procedures performed Allergies: Augmentin [amoxicillin-pot clavulanate]  and Flagyl [metronidazole]   Assessment / Plan:     Visit Diagnoses: No diagnosis found.    Orders: No orders of the defined types were placed in this encounter.  No orders of the defined types were placed in this encounter.   Face-to-face time spent with patient was *** minutes. 50% of time was spent in counseling and coordination of care.  Follow-Up Instructions: No Follow-up on file.   Earnestine Mealing, CMA  Note - This record has been created using Editor, commissioning.  Chart creation errors have been sought, but may not always  have been located. Such creation errors do not reflect on  the  standard of medical care.

## 2017-05-30 ENCOUNTER — Ambulatory Visit: Payer: PRIVATE HEALTH INSURANCE | Admitting: Rheumatology

## 2017-06-14 ENCOUNTER — Other Ambulatory Visit: Payer: Self-pay | Admitting: Rheumatology

## 2017-06-14 NOTE — Telephone Encounter (Signed)
Last Visit: 12/27/16 Next Visit: 07/11/17  Okay to refill per Dr. Estanislado Pandy

## 2017-06-24 ENCOUNTER — Other Ambulatory Visit: Payer: Self-pay | Admitting: Rheumatology

## 2017-06-24 ENCOUNTER — Telehealth: Payer: Self-pay | Admitting: *Deleted

## 2017-06-24 NOTE — Telephone Encounter (Signed)
Called patient to advise him he is due to update his labs. Patient states he has been having joint pain in his right knee. Patient describes it as a "pin stick" and tightness. Patient states he is able to function but would like to have it looked at. Patient has been scheduled for 06/27/17 @ 8:30 am.

## 2017-06-24 NOTE — Telephone Encounter (Signed)
Last Visit: 12/27/16 Next Visit: 07/11/17 Labs: 02/13/17 Stable  Patient will update labs this week.  Okay to refill 30 day supply per Dr. Estanislado Pandy

## 2017-06-24 NOTE — Progress Notes (Signed)
Office Visit Note  Patient: Kenneth Payne             Date of Birth: [birth year]           MRN: [id number]             PCP: Wenda Low, MD Referring: Wenda Low, MD Visit Date: 06/27/2017 Occupation: @GUAROCC @    Subjective:  Other (right knee pain/ swelling )   History of Present Illness: Kenneth Payne is a 56 y.o. male with history of ulcerative colitis and inflammatory arthritis. He had been doing well on combination of Humira and methotrexate. According to him about a month ago he was playing with his granddaughter when he had sudden pain in his right knee. The pain gradually got worse and now is been having swelling in his right knee. None of the other joints are painful. He denies any discomfort in the sacroiliac joints. He also reports right third trigger finger. He denies joint pain in any other joints. He had a colitis flare is starting in November for which she was seen by Dr. Collene Mares. He was given prednisone for that. He's been on tapering schedule of prednisone. He will finish the prednisone taper this week. His ulcerative colitis is better now.  Activities of Daily Living:  Patient reports morning stiffness for 2 minutes.   Patient Denies nocturnal pain.  Difficulty dressing/grooming: Denies Difficulty climbing stairs: Reports Difficulty getting out of chair: Reports Difficulty using hands for taps, buttons, cutlery, and/or writing: Denies   Review of Systems  Constitutional: Negative for fatigue, night sweats and weakness ( ).  HENT: Negative for mouth sores, mouth dryness and nose dryness.   Eyes: Negative for redness and dryness.  Respiratory: Negative for shortness of breath and difficulty breathing.   Cardiovascular: Negative for chest pain, palpitations, hypertension, irregular heartbeat and swelling in legs/feet.  Gastrointestinal: Negative for constipation and diarrhea.  Endocrine: Negative for increased urination.  Musculoskeletal: Positive for  arthralgias, joint pain, joint swelling and morning stiffness. Negative for myalgias, muscle weakness, muscle tenderness and myalgias.  Skin: Negative for color change, rash, hair loss, nodules/bumps, skin tightness, ulcers and sensitivity to sunlight.  Allergic/Immunologic: Negative for susceptible to infections.  Neurological: Negative for dizziness, fainting, memory loss and night sweats.  Hematological: Negative for swollen glands.  Psychiatric/Behavioral: Negative for depressed mood and sleep disturbance. The patient is not nervous/anxious.     PMFS History:  Patient Active Problem List   Diagnosis Date Noted  . High risk medication use 05/25/2016  . Pain, neck 05/25/2016  . Arthralgia of both hands 05/25/2016  . Pain of both sacroiliac joints 05/25/2016  . Neck mass   . Thrombocytosis ([place]) 08/20/2013  . Normocytic anemia 08/19/2013  . Ulcerative colitis ([facility]) 08/16/2013  . Ulcerative colitis, acute ([place]) 08/16/2013  . History of leukocytosis 08/16/2013    Past Medical History:  Diagnosis Date  . Rheumatoid arthritis ([place] [place])   . Ulcerative colitis ([place])     Family History  Problem Relation Age of Onset  . Asthma Mother   . Hypertension Mother   . Parkinson's disease Father   . Hypertension Father   . Stroke Father   . Heart attack Maternal Grandmother   . Heart attack Maternal Grandfather   . Stroke Paternal Grandmother   . Heart attack Paternal Grandfather   . Asthma Daughter    Past Surgical History:  Procedure Laterality Date  . COLONOSCOPY N/A 08/18/2013   Procedure: COLONOSCOPY;  Surgeon: Nancy Fetter  Brooke Bonito., MD;  Location: [facility];  Service: Endoscopy;  Laterality: N/A;  . HERNIA REPAIR     2009   Social History   Social History Narrative  . Not on file     Objective: Vital Signs: BP (!) 142/84 (BP Location: Left Arm, Patient Position: Sitting, Cuff Size: Normal)   Pulse 84   Resp 17   Ht 5' 7"  (1.702 m)   Wt 178 lb (80.7 kg)   BMI 27.88 kg/m     Physical Exam  Constitutional: He is oriented to person, place, and time. He appears well-developed and well-nourished.  HENT:  Head: Normocephalic and atraumatic.  Eyes: Conjunctivae and EOM are normal. Pupils are equal, round, and reactive to light.  Neck: Normal range of motion. Neck supple.  Cardiovascular: Normal rate, regular rhythm and normal heart sounds.  Pulmonary/Chest: Effort normal and breath sounds normal.  Abdominal: Soft. Bowel sounds are normal.  Neurological: He is alert and oriented to person, place, and time.  Skin: Skin is warm and dry. Capillary refill takes less than 2 seconds.  Psychiatric: He has a normal mood and affect. His behavior is normal.  Nursing note and vitals reviewed.    Musculoskeletal Exam: C-spine and thoracic lumbar spine good range of motion. There was no SI joint tenderness. Shoulder joints elbow joints wrist joint MCPs PIPs DIPs with good range of motion with no synovitis. Hip joints are good range of motion. His right knee joint warmth swelling and effusion. I did not notice any tenosynovitis in his right third trigger finger.  CDAI Exam: CDAI Homunculus Exam:   Tenderness:  RLE: tibiofemoral  Swelling:  RLE: tibiofemoral  Joint Counts:  CDAI Tender Joint count: 1 CDAI Swollen Joint count: 1  Global Assessments:  Patient Global Assessment: 3 Provider Global Assessment: 3  CDAI Calculated Score: 8    Investigation: No additional findings.TB Gold: 06/07/2016 Negative  CBC Latest Ref Rng & Units 02/13/2017 10/19/2016 09/28/2016  WBC 3.8 - 10.8 K/uL 10.1 11.2(H) 11.3(H)  Hemoglobin 13.2 - 17.1 g/dL 11.0(L) 10.6(L) 10.4(L)  Hematocrit 38.5 - 50.0 % 35.4(L) 34.1(L) 33.1(L)  Platelets 140 - 400 K/uL 512(H) 478(H) 579(H)   CMP Latest Ref Rng & Units 02/13/2017 10/19/2016 09/28/2016  Glucose 65 - 99 mg/dL 89 109(H) 88  BUN 7 - 25 mg/dL 14 12 13   Creatinine 0.70 - 1.33 mg/dL 1.12 1.12 1.05  Sodium 135 - 146 mmol/L 140 140 139    Potassium 3.5 - 5.3 mmol/L 4.4 4.3 4.2  Chloride 98 - 110 mmol/L 104 102 104  CO2 20 - 32 mmol/L 26 26 26   Calcium 8.6 - 10.3 mg/dL 9.5 9.1 8.9  Total Protein 6.1 - 8.1 g/dL 6.9 6.8 6.9  Total Bilirubin 0.2 - 1.2 mg/dL 0.4 0.4 0.3  Alkaline Phos 40 - 115 U/L 68 62 63  AST 10 - 35 U/L 14 12 12   ALT 9 - 46 U/L 12 9 8(L)    Imaging: Xr Knee 3 View Right  Result Date: 06/27/2017 No medial or lateral compartment narrowing was noted. No chondrocalcinosis was noted. Mild patellofemoral narrowing was noted. Impression: Mild chondromalacia patella of the knee.   Speciality Comments: No specialty comments available.    Procedures:  Large Joint Inj: R knee on 06/27/2017 9:25 AM Indications: pain Details: 27 G 1.5 in needle, medial approach  Arthrogram: No  Medications: 3 mL lidocaine 1 %; 80 mg triamcinolone acetonide 40 MG/ML Aspirate: 17 mL clear; sent for lab analysis Outcome: tolerated well,  no immediate complications Procedure, treatment alternatives, risks and benefits explained, specific risks discussed. Consent was given by the patient. Immediately prior to procedure a time out was called to verify the correct patient, procedure, equipment, support staff and site/side marked as required. Patient was prepped and draped in the usual sterile fashion.     Allergies: Augmentin [amoxicillin-pot clavulanate] and Flagyl [metronidazole]   Assessment / Plan:     Visit Diagnoses: Other ulcerative colitis with other complication ([place]) -patient reports having a recent flare of ulcerative colitis for which she was started on prednisone in November. He's been gradually tapering prednisone and his symptoms have resolved. He's been fu by Dr. Collene Mares.  Inflammatory arthritis - arthralgias and sacroiliitis resolved. He is doing quite well on combination of methotrexate 1 mL subcutaneous every week and Humira.  Chronic pain of right knee - Plan: XR KNEE 3 VIEW RIGHT. The x-ray of the knee joint was  unremarkable except for mild chondromalacia.  Effusion, right knee -he had moderate effusion in the right knee joint. After different treatments options and side effects were reviewed. Decided to proceed with aspiration and injection of the knee joint. The procedures described above. He tolerated the procedure well. The synovial splint for several for testing. Plan: Synovial cell count + diff, w/ crystals   Trigger middle finger of right hand: Gives history of intermittent right third trigger finger. I did not feel any tenderness on palpation or tendon thickening. Given him a prescription for Voltaren gel that can be used topically to relieve the symptoms. If he has persistent problems with consider cortisone injection in future.  High risk medication use - Methotrexate 1 mL subcutaneous every week, folic acid 2 mg by mouth daily, Humira 28m po q o wk (prescribed by Dr MCollene Mares. - Plan: QuantiFERON-TB Gold Plus, QuantiFERON-TB Gold Plus, COMPLETE METABOLIC PANEL WITH GFR, CBC with Differential/Platelet today and then every 3 months.  DDD (degenerative disc disease), cervical: Doing well   Normocytic anemia - He continues to be anemic have advised him to discuss this further with Dr. MCollene Mares  Thrombocytosis ([place]: Most likely reactive due to chronic anemia       Orders: Orders Placed This Encounter  Procedures  . Large Joint Inj  . Anaerobic and Aerobic Culture  . XR KNEE 3 VIEW RIGHT  . QuantiFERON-TB Gold Plus  . Synovial cell count + diff, w/ crystals   Meds ordered this encounter  Medications  . diclofenac sodium (VOLTAREN) 1 % GEL    Sig: Apply 3 gm to 3 large joints up to 3 times a day.Dispense 3 tubes with 3 refills.    Dispense:  3 Tube    Refill:  0    Face-to-face time spent with patient was 30 minutes. Greater than 50% of time was spent in counseling and coordination of care.  Follow-Up Instructions: Return in about 3 months (around 09/25/2017) for Inflammatory arthritis,  ulcerative colitis.   SBo Merino MD  Note - This record has been created using DEditor, commissioning  Chart creation errors have been sought, but may not always  have been located. Such creation errors do not reflect on  the standard of medical care.

## 2017-06-27 ENCOUNTER — Ambulatory Visit (INDEPENDENT_AMBULATORY_CARE_PROVIDER_SITE_OTHER): Payer: PRIVATE HEALTH INSURANCE

## 2017-06-27 ENCOUNTER — Other Ambulatory Visit: Payer: Self-pay

## 2017-06-27 ENCOUNTER — Ambulatory Visit (INDEPENDENT_AMBULATORY_CARE_PROVIDER_SITE_OTHER): Payer: PRIVATE HEALTH INSURANCE | Admitting: Rheumatology

## 2017-06-27 ENCOUNTER — Encounter: Payer: Self-pay | Admitting: Rheumatology

## 2017-06-27 VITALS — BP 142/84 | HR 84 | Resp 17 | Ht 67.0 in | Wt 178.0 lb

## 2017-06-27 DIAGNOSIS — D75839 Thrombocytosis, unspecified: Secondary | ICD-10-CM

## 2017-06-27 DIAGNOSIS — G8929 Other chronic pain: Secondary | ICD-10-CM | POA: Diagnosis not present

## 2017-06-27 DIAGNOSIS — M25461 Effusion, right knee: Secondary | ICD-10-CM

## 2017-06-27 DIAGNOSIS — M1990 Unspecified osteoarthritis, unspecified site: Secondary | ICD-10-CM

## 2017-06-27 DIAGNOSIS — Z79899 Other long term (current) drug therapy: Secondary | ICD-10-CM | POA: Diagnosis not present

## 2017-06-27 DIAGNOSIS — M65331 Trigger finger, right middle finger: Secondary | ICD-10-CM | POA: Diagnosis not present

## 2017-06-27 DIAGNOSIS — M5030 Other cervical disc degeneration, unspecified cervical region: Secondary | ICD-10-CM

## 2017-06-27 DIAGNOSIS — M25561 Pain in right knee: Secondary | ICD-10-CM | POA: Diagnosis not present

## 2017-06-27 DIAGNOSIS — K51818 Other ulcerative colitis with other complication: Secondary | ICD-10-CM | POA: Diagnosis not present

## 2017-06-27 DIAGNOSIS — D473 Essential (hemorrhagic) thrombocythemia: Secondary | ICD-10-CM | POA: Diagnosis not present

## 2017-06-27 DIAGNOSIS — D649 Anemia, unspecified: Secondary | ICD-10-CM

## 2017-06-27 MED ORDER — DICLOFENAC SODIUM 1 % TD GEL: TRANSDERMAL | 0 refills | Status: DC

## 2017-06-27 MED ORDER — LIDOCAINE HCL 1 % IJ SOLN
3.0000 mL | INTRAMUSCULAR | Status: AC | PRN
  Administered 2017-06-27: 3 mL

## 2017-06-27 MED ORDER — TRIAMCINOLONE ACETONIDE 40 MG/ML IJ SUSP
80.0000 mg | INTRAMUSCULAR | Status: AC | PRN
  Administered 2017-06-27: 80 mg via INTRA_ARTICULAR

## 2017-06-27 NOTE — Patient Instructions (Signed)
Standing Labs We placed an order today for your standing lab work.    Please come back and get your standing labs in April and every 3 months  We have open lab Monday through Friday from 8:30-11:30 AM and 1:30-4 PM at the office of Dr. Bo Merino.   The office is located at [address] [place], [place], [place] [address] No appointment is necessary.   Labs are drawn by [facility].  You may receive a bill from [facility] for your lab work. If you have any questions regarding directions or hours of operation,  please call [phone].

## 2017-06-28 LAB — SYNOVIAL CELL COUNT + DIFF, W/ CRYSTALS
BASOPHILS, %: 0 %
Eosinophils-Synovial: 0 % (ref 0–2)
LYMPHOCYTES-SYNOVIAL FLD: 26 % (ref 0–74)
MONOCYTE/MACROPHAGE: 53 % (ref 0–69)
Neutrophil, Synovial: 7 % (ref 0–24)
SYNOVIOCYTES, %: 14 % (ref 0–15)
WBC, Synovial: 1456 cells/uL — ABNORMAL HIGH (ref <150)

## 2017-06-28 NOTE — Progress Notes (Signed)
WBC is high due to prednisone usage. Rest of the labs are normal. Please fax results to Dr. Collene Mares.

## 2017-06-28 NOTE — Progress Notes (Signed)
Synovial fluid is consistent with inflammatory arthritis.

## 2017-06-29 LAB — QUANTIFERON-TB GOLD PLUS
MITOGEN-NIL: 4.54 [IU]/mL
NIL: 0.03 IU/mL
QUANTIFERON-TB GOLD PLUS: NEGATIVE
TB1-NIL: 0.01 [IU]/mL
TB2-NIL: 0 [IU]/mL

## 2017-06-29 LAB — COMPLETE METABOLIC PANEL WITH GFR
AG RATIO: 2.1 (calc) (ref 1.0–2.5)
ALBUMIN MSPROF: 4.5 g/dL (ref 3.6–5.1)
ALT: 28 U/L (ref 9–46)
AST: 20 U/L (ref 10–35)
Alkaline phosphatase (APISO): 64 U/L (ref 40–115)
BILIRUBIN TOTAL: 0.4 mg/dL (ref 0.2–1.2)
BUN: 18 mg/dL (ref 7–25)
CHLORIDE: 101 mmol/L (ref 98–110)
CO2: 31 mmol/L (ref 20–32)
Calcium: 9.6 mg/dL (ref 8.6–10.3)
Creat: 1.06 mg/dL (ref 0.70–1.33)
GFR, EST AFRICAN AMERICAN: 91 mL/min/{1.73_m2} (ref >=60)
GFR, Est Non African American: 79 mL/min/{1.73_m2} (ref >=60)
GLUCOSE: 87 mg/dL (ref 65–99)
Globulin: 2.1 g/dL (calc) (ref 1.9–3.7)
POTASSIUM: 4.7 mmol/L (ref 3.5–5.3)
Sodium: 138 mmol/L (ref 135–146)
TOTAL PROTEIN: 6.6 g/dL (ref 6.1–8.1)

## 2017-06-29 LAB — CBC WITH DIFFERENTIAL/PLATELET
BASOS ABS: 72 {cells}/uL (ref 0–200)
Basophils Relative: 0.6 %
EOS PCT: 1.7 %
Eosinophils Absolute: 204 cells/uL (ref 15–500)
HCT: 40.5 % (ref 38.5–50.0)
Hemoglobin: 13.3 g/dL (ref 13.2–17.1)
Lymphs Abs: 972 cells/uL (ref 850–3900)
MCH: 28.1 pg (ref 27.0–33.0)
MCHC: 32.8 g/dL (ref 32.0–36.0)
MCV: 85.4 fL (ref 80.0–100.0)
MONOS PCT: 6.2 %
MPV: 10.4 fL (ref 7.5–12.5)
NEUTROS PCT: 83.4 %
Neutro Abs: 10008 cells/uL — ABNORMAL HIGH (ref 1500–7800)
PLATELETS: 447 10*3/uL — AB (ref 140–400)
RBC: 4.74 10*6/uL (ref 4.20–5.80)
RDW: 22 % — AB (ref 11.0–15.0)
Total Lymphocyte: 8.1 %
WBC mixed population: 744 cells/uL (ref 200–950)
WBC: 12 10*3/uL — AB (ref 3.8–10.8)

## 2017-07-03 LAB — ANAEROBIC AND AEROBIC CULTURE
AER RESULT:: NO GROWTH
MICRO NUMBER: 90014111
MICRO NUMBER:: 90014112
SPECIMEN QUALITY: ADEQUATE
SPECIMEN QUALITY:: ADEQUATE

## 2017-07-11 ENCOUNTER — Ambulatory Visit: Payer: PRIVATE HEALTH INSURANCE | Admitting: Rheumatology

## 2017-08-16 ENCOUNTER — Other Ambulatory Visit: Payer: Self-pay | Admitting: Rheumatology

## 2017-08-16 NOTE — Telephone Encounter (Signed)
Last visit: 06/27/2017 Next visit: 10/01/2017  Used to inject MTX weekly.  Okay to refill per Dr. Estanislado Pandy.

## 2017-08-29 ENCOUNTER — Other Ambulatory Visit: Payer: Self-pay | Admitting: Rheumatology

## 2017-08-29 NOTE — Telephone Encounter (Signed)
Last visit: 06/27/2017 Next visit: 10/01/2017 Labs: 06/27/17 WBC is high due to prednisone usage. Rest of the labs are normal  Okay to refill per Dr. Estanislado Pandy

## 2017-09-18 NOTE — Progress Notes (Signed)
Office Visit Note  Patient: Kenneth Payne             Date of Birth: 02-13-1961           MRN: [id number]             PCP: Wenda Low, MD Referring: Wenda Low, MD Visit Date: 10/01/2017 Occupation: @GUAROCC @    Subjective:  Arthritis (Doing good), right knee pain   History of Present Illness: Kenneth Payne is a 57 y.o. male with history of ulcerative colitis and inflammatory arthritis.  He states he did much better after right knee joint aspiration.  He still has intermittent pain and discomfort in his right knee but much better now.  He does have right third trigger finger which bothers him off and on.  The SI joint discomfort has resolved.  None of the other joints are painful.  He had recent upper respiratory tract infection which was treated with Z-Pak and the symptoms have resolved now.  He was seen by his PCP this morning and had lab work.  Activities of Daily Living:  Patient reports morning stiffness for 0 none.   Patient Denies nocturnal pain.  Difficulty dressing/grooming: Denies Difficulty climbing stairs: Denies Difficulty getting out of chair: Denies Difficulty using hands for taps, buttons, cutlery, and/or writing: Denies   Review of Systems  Constitutional: Negative for activity change, fatigue and night sweats.  HENT: Negative for mouth sores, mouth dryness and nose dryness.   Eyes: Negative for redness and dryness.  Respiratory: Negative for shortness of breath and difficulty breathing.   Cardiovascular: Negative for chest pain, palpitations, hypertension, irregular heartbeat and swelling in legs/feet.  Gastrointestinal: Negative for constipation and diarrhea.  Endocrine: Negative for cold intolerance and increased urination.  Genitourinary: Negative for difficulty urinating.  Musculoskeletal: Positive for arthralgias and joint pain. Negative for joint swelling, myalgias, muscle weakness, morning stiffness, muscle tenderness and myalgias.  Skin: Negative  for color change, rash, hair loss, nodules/bumps, skin tightness, ulcers and sensitivity to sunlight.  Allergic/Immunologic: Negative for susceptible to infections.  Neurological: Negative for dizziness, fainting, memory loss, night sweats and weakness.  Hematological: Negative for bruising/bleeding tendency and swollen glands.  Psychiatric/Behavioral: Negative for depressed mood and sleep disturbance. The patient is not nervous/anxious.     PMFS History:  Patient Active Problem List   Diagnosis Date Noted  . High risk medication use 05/25/2016  . Pain, neck 05/25/2016  . Arthralgia of both hands 05/25/2016  . Pain of both sacroiliac joints 05/25/2016  . Neck mass   . Thrombocytosis ([place]) 08/20/2013  . Normocytic anemia 08/19/2013  . Ulcerative colitis ([place]) 08/16/2013  . Ulcerative colitis, acute ([place]) 08/16/2013  . History of leukocytosis 08/16/2013    Past Medical History:  Diagnosis Date  . Rheumatoid arthritis ([place])   . Ulcerative colitis ([place])     Family History  Problem Relation Age of Onset  . Asthma Mother   . Hypertension Mother   . Parkinson's disease Father   . Hypertension Father   . Stroke Father   . Heart attack Maternal Grandmother   . Heart attack Maternal Grandfather   . Stroke Paternal Grandmother   . Heart attack Paternal Grandfather   . Asthma Daughter    Past Surgical History:  Procedure Laterality Date  . COLONOSCOPY N/A 08/18/2013   Procedure: COLONOSCOPY;  Surgeon: Winfield Cunas., MD;  Location: [facility];  Service: Endoscopy;  Laterality: N/A;  . CYST REMOVAL NECK    .  HERNIA REPAIR     2009   Social History   Social History Narrative  . Not on file     Objective: Vital Signs: BP 137/75 (BP Location: Left Arm, Patient Position: Sitting, Cuff Size: Normal)   Pulse 87   Resp 14   Ht 5' 7"  (1.702 m)   Wt 177 lb (80.3 kg)   BMI 27.72 kg/m    Physical Exam  Constitutional: He is oriented to person, place, and time. He appears  well-developed and well-nourished.  HENT:  Head: Normocephalic and atraumatic.  Eyes: Pupils are equal, round, and reactive to light. Conjunctivae and EOM are normal.  Neck: Normal range of motion. Neck supple.  Cardiovascular: Normal rate, regular rhythm and normal heart sounds.  Pulmonary/Chest: Effort normal and breath sounds normal.  Abdominal: Soft. Bowel sounds are normal.  Neurological: He is alert and oriented to person, place, and time.  Skin: Skin is warm and dry. Capillary refill takes less than 2 seconds.  Psychiatric: He has a normal mood and affect. His behavior is normal.  Nursing note and vitals reviewed.    Musculoskeletal Exam: C-spine good range of motion.  He has mild thoracic kyphosis.  No SI joint tenderness was noted.  Shoulder joints elbow joints wrist joints were in good range of motion.  He has some DIP PIP thickening with no synovitis.  He has right third trigger finger.  Hip joints were in good range of motion.  He has some warmth and swelling in his right knee joint without any effusion.  CDAI Exam: CDAI Homunculus Exam:   Tenderness:  RLE: tibiofemoral  Joint Counts:  CDAI Tender Joint count: 1 CDAI Swollen Joint count: 0  Global Assessments:  Patient Global Assessment: 1 Provider Global Assessment: 1  CDAI Calculated Score: 3    Investigation: No additional findings.TB gold: 06/27/2017 Negative  CBC Latest Ref Rng & Units 06/27/2017 02/13/2017 10/19/2016  WBC 3.8 - 10.8 Thousand/uL 12.0(H) 10.1 11.2(H)  Hemoglobin 13.2 - 17.1 g/dL 13.3 11.0(L) 10.6(L)  Hematocrit 38.5 - 50.0 % 40.5 35.4(L) 34.1(L)  Platelets 140 - 400 Thousand/uL 447(H) 512(H) 478(H)   CMP Latest Ref Rng & Units 06/27/2017 02/13/2017 10/19/2016  Glucose 65 - 99 mg/dL 87 89 109(H)  BUN 7 - 25 mg/dL 18 14 12   Creatinine 0.70 - 1.33 mg/dL 1.06 1.12 1.12  Sodium 135 - 146 mmol/L 138 140 140  Potassium 3.5 - 5.3 mmol/L 4.7 4.4 4.3  Chloride 98 - 110 mmol/L 101 104 102  CO2 20 - 32  mmol/L 31 26 26   Calcium 8.6 - 10.3 mg/dL 9.6 9.5 9.1  Total Protein 6.1 - 8.1 g/dL 6.6 6.9 6.8  Total Bilirubin 0.2 - 1.2 mg/dL 0.4 0.4 0.4  Alkaline Phos 40 - 115 U/L - 68 62  AST 10 - 35 U/L 20 14 12   ALT 9 - 46 U/L 28 12 9     Imaging: Dg Chest 2 View  Result Date: 09/24/2017 CLINICAL DATA:  Cough and fever for several days EXAM: CHEST - 2 VIEW COMPARISON:  06/07/2016 FINDINGS: The heart size and mediastinal contours are within normal limits. Both lungs are clear. The visualized skeletal structures are unremarkable. IMPRESSION: No active cardiopulmonary disease. Electronically Signed   By: Inez Catalina M.D.   On: 09/24/2017 14:42    Speciality Comments: No specialty comments available.    Procedures:  No procedures performed Allergies: Augmentin [amoxicillin-pot clavulanate] and Flagyl [metronidazole]   Assessment / Plan:     Visit Diagnoses:  Other ulcerative colitis with other complication ([place])- According to patient the disease is not active currently.  Inflammatory arthritis - arthralgias and sacroiliitis resolved.  He had recent right knee joint effusion which was aspirated.  Chronic pain of right knee -He had warmth and swelling in his right knee joint today.  I offered cortisone injection but he declined.  We will observe it for right now. High risk medication use - Methotrexate 1 mL subcutaneous every week, folic acid 2 mg by mouth daily, Humira 51m po q o wk (prescribed by Dr MCollene Mares.  He had labs drawn at his PCPs office today.  He [place]  Trigger middle finger of right hand-at this point he does not want cortisone injection.  DDD (degenerative disc disease), cervical-he has limited range of motion of his C-spine.  Normocytic anemia - He continues to be anemic have advised him to discuss this further with Dr. MCollene Mares  Thrombocytosis ([place] - Most likely reactive due to chronic anemia    Orders: No orders of the defined types were placed in  this encounter.  No orders of the defined types were placed in this encounter.   Face-to-face time spent with patient was 20 minutes.  Greater than 50% of time was spent in counseling and coordination of care.  Follow-Up Instructions: Return in about 5 months (around 03/03/2018).   SBo Merino MD  Note - This record has been created using DEditor, commissioning  Chart creation errors have been sought, but may not always  have been located. Such creation errors do not reflect on  the standard of medical care.

## 2017-09-24 ENCOUNTER — Emergency Department (HOSPITAL_BASED_OUTPATIENT_CLINIC_OR_DEPARTMENT_OTHER)
Admission: EM | Admit: 2017-09-24 | Discharge: 2017-09-24 | Disposition: A | Discharge status: Home / Self Care | Payer: PRIVATE HEALTH INSURANCE | Attending: Emergency Medicine | Admitting: Emergency Medicine

## 2017-09-24 ENCOUNTER — Encounter (HOSPITAL_BASED_OUTPATIENT_CLINIC_OR_DEPARTMENT_OTHER): Payer: Self-pay | Admitting: *Deleted

## 2017-09-24 ENCOUNTER — Emergency Department (HOSPITAL_BASED_OUTPATIENT_CLINIC_OR_DEPARTMENT_OTHER): Payer: PRIVATE HEALTH INSURANCE

## 2017-09-24 DIAGNOSIS — R05 Cough: Secondary | ICD-10-CM | POA: Diagnosis present

## 2017-09-24 DIAGNOSIS — J069 Acute upper respiratory infection, unspecified: Secondary | ICD-10-CM | POA: Insufficient documentation

## 2017-09-24 DIAGNOSIS — Z79899 Other long term (current) drug therapy: Secondary | ICD-10-CM | POA: Insufficient documentation

## 2017-09-24 DIAGNOSIS — B9789 Other viral agents as the cause of diseases classified elsewhere: Secondary | ICD-10-CM

## 2017-09-24 MED ORDER — BENZONATATE 100 MG PO CAPS: 100.0000 mg | ORAL_CAPSULE | Freq: Three times a day (TID) | ORAL | 0 refills | Status: DC

## 2017-09-24 MED FILL — BENZONATATE 100 MG CAPSULE: 100 | 7 days supply | Qty: 21 | Fill #0

## 2017-09-24 NOTE — ED Triage Notes (Signed)
Pt is here for URI which began Thursday am.  Pt has been having a productive cough (clear phlegm) pt reports low grade temp at home, 100.17F max over the weekend and pt is hoarse.  No sob, no body aches, no n/v.  Pt feels fatigued. No body aches.

## 2017-09-24 NOTE — ED Provider Notes (Signed)
[facility] EMERGENCY DEPARTMENT Provider Note   CSN: [id number] Arrival date & time: 09/24/17  1259     History   Chief Complaint Chief Complaint  Patient presents with  . URI    HPI Kenneth Payne is a 57 y.o. male with history of ulcerative colitis and rheumatoid arthritis who presents with a 5-day history of cough, nasal congestion.  Patient reports he initially had some sore throat, however that has resolved.  He has had some hoarse voice.  He has been coughing up some clear and yellow sputum.  He has had intermittent times of shortness of breath.  He denies chest pain.  He denies any abdominal pain, nausea, vomiting, diarrhea.  He has been taking over-the-counter Allegra and Delsym with some relief.  He reports having a low-grade fever, 100.1, 2-3 days ago, however this is not returned.  His family has been sick with similar symptoms.  HPI  Past Medical History:  Diagnosis Date  . Rheumatoid arthritis ([place])   . Ulcerative colitis [facility])     Patient Active Problem List   Diagnosis Date Noted  . High risk medication use 05/25/2016  . Pain, neck 05/25/2016  . Arthralgia of both hands 05/25/2016  . Pain of both sacroiliac joints 05/25/2016  . Neck mass   . Thrombocytosis ([place]) 08/20/2013  . Normocytic anemia 08/19/2013  . Ulcerative colitis ([place]) 08/16/2013  . Ulcerative colitis, acute ([place]) 08/16/2013  . History of leukocytosis 08/16/2013    Past Surgical History:  Procedure Laterality Date  . COLONOSCOPY N/A 08/18/2013   Procedure: COLONOSCOPY;  Surgeon: Winfield Cunas., MD;  Location: [facility];  Service: Endoscopy;  Laterality: N/A;  . HERNIA REPAIR     2009        Home Medications    Prior to Admission medications   Medication Sig Start Date End Date Taking? Authorizing Provider  Adalimumab (HUMIRA PEN) 40 MG/0.8ML PNKT Inject 40 mg into the skin every 14 (fourteen) days. Next injection 01/01/15   Yes [provider]  diclofenac  sodium (VOLTAREN) 1 % GEL Apply 3 gm to 3 large joints up to 3 times a day.Dispense 3 tubes with 3 refills. 06/27/17  Yes Deveshwar, Abel Presto, MD  folic acid (FOLVITE) 1 MG tablet TAKE 2 TABLETS BY MOUTH DAILY. 06/14/17  Yes Deveshwar, Abel Presto, MD  methotrexate 250 MG/10ML injection INJECT 1ML INTO THE SKIN ONCE A WEEK 08/29/17  Yes Deveshwar, Abel Presto, MD  B-D TB SYRINGE 1CC/27GX1/2" 27G X 1/2" 1 ML MISC USE 1 SYRINGE WEEKLY TO INJECT METHOTREXATE 08/16/17   Bo Merino, MD  benzonatate (TESSALON) 100 MG capsule Take 1 capsule (100 mg total) by mouth every 8 (eight) hours. 09/24/17   Jabar Krysiak, Bea Graff, PA-C  predniSONE (DELTASONE) 10 MG tablet daily.  05/23/17   [provider]  Tuberculin-Allergy Syringes 27G X 1/2" 1 ML KIT Inject 1 Syringe into the skin once a week. To be used with weekly methotrexate injections. 10/01/16   Bo Merino, MD    Family History Family History  Problem Relation Age of Onset  . Asthma Mother   . Hypertension Mother   . Parkinson's disease Father   . Hypertension Father   . Stroke Father   . Heart attack Maternal Grandmother   . Heart attack Maternal Grandfather   . Stroke Paternal Grandmother   . Heart attack Paternal Grandfather   . Asthma Daughter     Social History Social History   Tobacco Use  . Smoking status:  Never Smoker  . Smokeless tobacco: Never Used  Substance Use Topics  . Alcohol use: No  . Drug use: No     Allergies   Augmentin [amoxicillin-pot clavulanate] and Flagyl [metronidazole]   Review of Systems Review of Systems  Constitutional: Negative for chills and fever.  HENT: Positive for congestion and sore throat (resolved). Negative for ear pain.   Respiratory: Positive for cough and shortness of breath.   Cardiovascular: Negative for chest pain.  Gastrointestinal: Negative for abdominal pain, diarrhea, nausea and vomiting.     Physical Exam Updated Vital Signs BP (!) 150/101 (BP Location: Left Arm)   Pulse 95    Temp 98.3 F (36.8 C) (Oral)   Resp 18   Wt 80.7 kg (178 lb)   SpO2 97%   BMI 27.88 kg/m   Physical Exam  Constitutional: He appears well-developed and well-nourished. No distress.  HENT:  Head: Normocephalic and atraumatic.  Right Ear: Tympanic membrane normal.  Mouth/Throat: Oropharynx is clear and moist. No oropharyngeal exudate.  L ear canal occluded with cerumen  Eyes: Pupils are equal, round, and reactive to light. Conjunctivae are normal. Right eye exhibits no discharge. Left eye exhibits no discharge. No scleral icterus.  Neck: Normal range of motion. Neck supple. No thyromegaly present.  Cardiovascular: Normal rate, regular rhythm, normal heart sounds and intact distal pulses. Exam reveals no gallop and no friction rub.  No murmur heard. Pulmonary/Chest: Effort normal. No stridor. No respiratory distress. He has no wheezes. He has rales (R mid lung field).  Abdominal: Soft. Bowel sounds are normal. He exhibits no distension. There is no tenderness. There is no rebound and no guarding.  Musculoskeletal: He exhibits no edema.  Lymphadenopathy:    He has no cervical adenopathy.  Neurological: He is alert. Coordination normal.  Skin: Skin is warm and dry. No rash noted. He is not diaphoretic. No pallor.  Psychiatric: He has a normal mood and affect.  Nursing note and vitals reviewed.    ED Treatments / Results  Labs (all labs ordered are listed, but only abnormal results are displayed) Labs Reviewed - No data to display  EKG None  Radiology Dg Chest 2 View  Result Date: 09/24/2017 CLINICAL DATA:  Cough and fever for several days EXAM: CHEST - 2 VIEW COMPARISON:  06/07/2016 FINDINGS: The heart size and mediastinal contours are within normal limits. Both lungs are clear. The visualized skeletal structures are unremarkable. IMPRESSION: No active cardiopulmonary disease. Electronically Signed   By: Inez Catalina M.D.   On: 09/24/2017 14:42    Procedures Procedures  (including critical care time)  Medications Ordered in ED Medications - No data to display   Initial Impression / Assessment and Plan / ED Course  I have reviewed the triage vital signs and the nursing notes.  Pertinent labs & imaging results that were available during my care of the patient were reviewed by me and considered in my medical decision making (see chart for details).     Pt symptoms consistent with URI. CXR negative for acute infiltrate. Pt will be discharged with symptomatic treatment.  Will discharge home with Tessalon instead of Delsym.  Patient to continue taking Allegra-D at home.  Advised recheck of symptoms and blood pressure at PCP in 2-3 days.  Patient reports no history of hypertension and is not on any hypertension medication.  Discussed return precautions.  Patient understands and agrees with plan.  Patient vitals stable throughout ED course and discharged in satisfactory condition.  Final  Clinical Impressions(s) / ED Diagnoses   Final diagnoses:  Viral URI with cough    ED Discharge Orders        Ordered    benzonatate (TESSALON) 100 MG capsule  Every 8 hours,   Status:  Discontinued     09/24/17 1540    benzonatate (TESSALON) 100 MG capsule  Every 8 hours     09/24/17 1542       Fayelynn Distel, [place], PA-C 09/24/17 1544    Julianne Rice, MD 09/25/17 0730

## 2017-09-24 NOTE — Discharge Instructions (Signed)
Medications: Tessalon  Treatment: Take Tessalon every 8 hours as needed for cough.  You can continue taking Allegra-D at home.  Patient to drink plenty of fluids.  You can alternate with Motrin and Tylenol as needed for fever, as prescribed over-the-counter.  Follow-up: Please follow-up with your doctor in 2-3 days for recheck of your blood pressure and upper respiratory symptoms.  Please return to the emergency department if you develop any new or worsening symptoms including worsening shortness of breath, chest pain, or any other new or concerning symptom.

## 2017-09-24 NOTE — ED Notes (Signed)
Pt verbalized understanding of discharge instructions; signature pad not working.

## 2017-09-24 NOTE — ED Notes (Signed)
Pt states that his whole family has had URI

## 2017-09-24 NOTE — ED Notes (Signed)
Patient transported to X-ray 

## 2017-10-01 ENCOUNTER — Ambulatory Visit: Payer: PRIVATE HEALTH INSURANCE | Admitting: Rheumatology

## 2017-10-01 ENCOUNTER — Encounter: Payer: Self-pay | Admitting: Rheumatology

## 2017-10-01 VITALS — BP 137/75 | HR 87 | Resp 14 | Ht 67.0 in | Wt 177.0 lb

## 2017-10-01 DIAGNOSIS — M5030 Other cervical disc degeneration, unspecified cervical region: Secondary | ICD-10-CM

## 2017-10-01 DIAGNOSIS — D473 Essential (hemorrhagic) thrombocythemia: Secondary | ICD-10-CM

## 2017-10-01 DIAGNOSIS — M1990 Unspecified osteoarthritis, unspecified site: Secondary | ICD-10-CM | POA: Diagnosis not present

## 2017-10-01 DIAGNOSIS — M25561 Pain in right knee: Secondary | ICD-10-CM

## 2017-10-01 DIAGNOSIS — D649 Anemia, unspecified: Secondary | ICD-10-CM | POA: Diagnosis not present

## 2017-10-01 DIAGNOSIS — M65331 Trigger finger, right middle finger: Secondary | ICD-10-CM

## 2017-10-01 DIAGNOSIS — Z79899 Other long term (current) drug therapy: Secondary | ICD-10-CM | POA: Diagnosis not present

## 2017-10-01 DIAGNOSIS — M1380 Other specified arthritis, unspecified site: Secondary | ICD-10-CM

## 2017-10-01 DIAGNOSIS — K51818 Other ulcerative colitis with other complication: Secondary | ICD-10-CM

## 2017-10-01 DIAGNOSIS — G8929 Other chronic pain: Secondary | ICD-10-CM

## 2017-10-01 DIAGNOSIS — D75839 Thrombocytosis, unspecified: Secondary | ICD-10-CM

## 2017-10-01 NOTE — Patient Instructions (Signed)
Standing Labs We placed an order today for your standing lab work.    Please come back and get your standing labs in July and every 3 months.  We have open lab Monday through Friday from 8:30-11:30 AM and 1:30-4:00 PM  at the office of Dr. Bo Merino.   You may experience shorter wait times on Monday and Friday afternoons. The office is located at [address], [place], [place], [place] [address] No appointment is necessary.   Labs are drawn by [facility].  You may receive a bill from [facility] for your lab work. If you have any questions regarding directions or hours of operation,  please call [phone].

## 2017-10-03 ENCOUNTER — Ambulatory Visit: Payer: PRIVATE HEALTH INSURANCE | Admitting: Rheumatology

## 2017-11-27 NOTE — Progress Notes (Signed)
Office Visit Note  Patient: Kenneth Payne             Date of Birth: Aug 08, 1960           MRN: [id number]             PCP: Wenda Low, MD Referring: Wenda Low, MD Visit Date: 11/28/2017 Occupation: @GUAROCC @    Subjective:  Right knee joint swelling.   History of Present Illness: Kenneth Payne is a 57 y.o. male with history of ulcerative colitis and inflammatory arthritis.  He states he had to skip Humira and methotrexate for 2 weeks for this infection.  He states he has been having some tightness in his knee joint which got worse after that.  He has been having pain and swelling in his right knee now.  He continues to have triggering of his right middle finger.  He has some stiffness in the cervical spine due to underlying disc disease.  None of the other joints are painful.  His ulcerative colitis is well controlled with Humira and methotrexate combination.  He rests recently had GI infection and has been taking Cipro.  Activities of Daily Living:  Patient reports morning stiffness for 2 minutes.   Patient Denies nocturnal pain.  Difficulty dressing/grooming: Denies Difficulty climbing stairs: Reports Difficulty getting out of chair: Denies Difficulty using hands for taps, buttons, cutlery, and/or writing: Denies   Review of Systems  Constitutional: Negative for fatigue and night sweats.  HENT: Negative for mouth sores, mouth dryness and nose dryness.   Eyes: Negative for redness and dryness.  Respiratory: Negative for shortness of breath and difficulty breathing.   Cardiovascular: Negative for chest pain, palpitations, hypertension, irregular heartbeat and swelling in legs/feet.  Gastrointestinal: Negative for constipation and diarrhea.  Endocrine: Negative for increased urination.  Musculoskeletal: Positive for arthralgias, joint pain, joint swelling and morning stiffness. Negative for myalgias, muscle weakness, muscle tenderness and myalgias.  Skin: Negative for  color change, rash, hair loss, nodules/bumps, skin tightness, ulcers and sensitivity to sunlight.  Allergic/Immunologic: Negative for susceptible to infections.  Neurological: Negative for dizziness, fainting, memory loss, night sweats and weakness ( ).  Hematological: Negative for swollen glands.  Psychiatric/Behavioral: Negative for depressed mood and sleep disturbance. The patient is not nervous/anxious.     PMFS History:  Patient Active Problem List   Diagnosis Date Noted  . High risk medication use 05/25/2016  . Pain, neck 05/25/2016  . Arthralgia of both hands 05/25/2016  . Pain of both sacroiliac joints 05/25/2016  . Neck mass   . Thrombocytosis ([place]) 08/20/2013  . Normocytic anemia 08/19/2013  . Ulcerative colitis ([place]) 08/16/2013  . Ulcerative colitis, acute ([place]) 08/16/2013  . History of leukocytosis 08/16/2013    Past Medical History:  Diagnosis Date  . Rheumatoid arthritis ([place])   . Ulcerative colitis (Kenneth Payne)     Family History  Problem Relation Age of Onset  . Asthma Mother   . Hypertension Mother   . Parkinson's disease Father   . Hypertension Father   . Stroke Father   . Heart attack Maternal Grandmother   . Heart attack Maternal Grandfather   . Stroke Paternal Grandmother   . Heart attack Paternal Grandfather   . Asthma Daughter    Past Surgical History:  Procedure Laterality Date  . COLONOSCOPY N/A 08/18/2013   Procedure: COLONOSCOPY;  Surgeon: Winfield Cunas., MD;  Location: [facility];  Service: Endoscopy;  Laterality: N/A;  . CYST REMOVAL NECK    .  HERNIA REPAIR     2009   Social History   Social History Narrative  . Not on file     Objective: Vital Signs: BP (!) 146/89 (BP Location: Left Arm, Patient Position: Sitting, Cuff Size: Normal)   Pulse 83   Resp 13   Ht 5' 7"  (1.702 m)   Wt 179 lb (81.2 kg)   BMI 28.04 kg/m    Physical Exam  Constitutional: He is oriented to person, place, and time. He appears well-developed and  well-nourished.  HENT:  Head: Normocephalic and atraumatic.  Eyes: Pupils are equal, round, and reactive to light. Conjunctivae and EOM are normal.  Neck: Normal range of motion. Neck supple.  Cardiovascular: Normal rate, regular rhythm and normal heart sounds.  Pulmonary/Chest: Effort normal and breath sounds normal.  Abdominal: Soft. Bowel sounds are normal.  Neurological: He is alert and oriented to person, place, and time.  Skin: Skin is warm and dry. Capillary refill takes less than 2 seconds.  Psychiatric: He has a normal mood and affect. His behavior is normal.  Nursing note and vitals reviewed.    Musculoskeletal Exam: Spine thoracic lumbar spine good range of motion.  Shoulder joints elbow joints wrist joint MCPs PIPs DIPs were in good range of motion with no synovitis.  He had right knee joint effusion and warmth.  All other joints are good range of motion without synovitis.  CDAI Exam: CDAI Homunculus Exam:   Tenderness:  RLE: tibiofemoral  Swelling:  RLE: tibiofemoral  Joint Counts:  CDAI Tender Joint count: 1 CDAI Swollen Joint count: 1  Global Assessments:  Patient Global Assessment: 1 Provider Global Assessment: 4  CDAI Calculated Score: 7    Investigation: No additional findings. CBC Latest Ref Rng & Units 06/27/2017 02/13/2017 10/19/2016  WBC 3.8 - 10.8 Thousand/uL 12.0(H) 10.1 11.2(H)  Hemoglobin 13.2 - 17.1 g/dL 13.3 11.0(L) 10.6(L)  Hematocrit 38.5 - 50.0 % 40.5 35.4(L) 34.1(L)  Platelets 140 - 400 Thousand/uL 447(H) 512(H) 478(H)   CMP Latest Ref Rng & Units 06/27/2017 02/13/2017 10/19/2016  Glucose 65 - 99 mg/dL 87 89 109(H)  BUN 7 - 25 mg/dL 18 14 12   Creatinine 0.70 - 1.33 mg/dL 1.06 1.12 1.12  Sodium 135 - 146 mmol/L 138 140 140  Potassium 3.5 - 5.3 mmol/L 4.7 4.4 4.3  Chloride 98 - 110 mmol/L 101 104 102  CO2 20 - 32 mmol/L 31 26 26   Calcium 8.6 - 10.3 mg/dL 9.6 9.5 9.1  Total Protein 6.1 - 8.1 g/dL 6.6 6.9 6.8  Total Bilirubin 0.2 - 1.2 mg/dL  0.4 0.4 0.4  Alkaline Phos 40 - 115 U/L - 68 62  AST 10 - 35 U/L 20 14 12   ALT 9 - 46 U/L 28 12 9     Imaging: No results found.  Speciality Comments: No specialty comments available.    Procedures:  Large Joint Inj: R knee on 11/28/2017 10:19 AM Indications: pain Details: 27 G 1.5 in needle, medial approach  Arthrogram: No  Medications: 3 mL lidocaine 1 %; 60 mg triamcinolone acetonide 40 MG/ML Aspirate: 12 mL Outcome: tolerated well, no immediate complications Procedure, treatment alternatives, risks and benefits explained, specific risks discussed. Consent was given by the patient. Immediately prior to procedure a time out was called to verify the correct patient, procedure, equipment, support staff and site/side marked as required. Patient was prepped and draped in the usual sterile fashion.     Allergies: Augmentin [amoxicillin-pot clavulanate] and Flagyl [metronidazole]   Assessment /  Plan:     Visit Diagnoses: Other ulcerative colitis with other complication (HCC)-patient reports that his GI symptoms has been quite well controlled.  He had recent diarrhea which was infectious and he is taking Cipro floxacillin for it.  High risk medication use - Methotrexate 1 mL subcutaneous every week, folic acid 2 mg by mouth daily, Humira 38m po q o wk (prescribed by Dr MCollene Mares.  He has been off methotrexate and Humira for the last 2 weeks.  We will check labs today and then every 3 months to monitor for drug toxicity.  Inflammatory arthritis - arthralgias and sacroiliitis resolved.  Effusion of right knee-he has recurrence of right knee joint effusion after he has been off methotrexate and Humira for 2 weeks.  Per his request after informed consent was obtained right knee joint was aspirated and injected with the cortisone as described above.  Trigger middle finger of right hand-at this time it is not very symptomatic.  DDD (degenerative disc disease), cervical-he has some chronic  C-spine discomfort.  Thrombocytosis (HCC)  Normocytic anemia    Orders: Orders Placed This Encounter  Procedures  . Large Joint Inj   No orders of the defined types were placed in this encounter.   Face-to-face time spent with patient was 30 minutes.> 50% of time was spent in counseling and coordination of care.  Follow-Up Instructions: Return in about 5 months (around 04/30/2018) for Ulcerative colitis, inflammatory arthritis.   SBo Merino MD  Note - This record has been created using DEditor, commissioning  Chart creation errors have been sought, but may not always  have been located. Such creation errors do not reflect on  the standard of medical care.

## 2017-11-28 ENCOUNTER — Ambulatory Visit: Payer: PRIVATE HEALTH INSURANCE | Admitting: Rheumatology

## 2017-11-28 ENCOUNTER — Other Ambulatory Visit: Payer: Self-pay | Admitting: Rheumatology

## 2017-11-28 ENCOUNTER — Encounter: Payer: Self-pay | Admitting: Rheumatology

## 2017-11-28 VITALS — BP 146/89 | HR 83 | Resp 13 | Ht 67.0 in | Wt 179.0 lb

## 2017-11-28 DIAGNOSIS — D649 Anemia, unspecified: Secondary | ICD-10-CM

## 2017-11-28 DIAGNOSIS — D75839 Thrombocytosis, unspecified: Secondary | ICD-10-CM

## 2017-11-28 DIAGNOSIS — M1380 Other specified arthritis, unspecified site: Secondary | ICD-10-CM

## 2017-11-28 DIAGNOSIS — D473 Essential (hemorrhagic) thrombocythemia: Secondary | ICD-10-CM | POA: Diagnosis not present

## 2017-11-28 DIAGNOSIS — Z79899 Other long term (current) drug therapy: Secondary | ICD-10-CM | POA: Diagnosis not present

## 2017-11-28 DIAGNOSIS — K51818 Other ulcerative colitis with other complication: Secondary | ICD-10-CM

## 2017-11-28 DIAGNOSIS — M1990 Unspecified osteoarthritis, unspecified site: Secondary | ICD-10-CM

## 2017-11-28 DIAGNOSIS — M25461 Effusion, right knee: Secondary | ICD-10-CM | POA: Diagnosis not present

## 2017-11-28 DIAGNOSIS — M65331 Trigger finger, right middle finger: Secondary | ICD-10-CM

## 2017-11-28 DIAGNOSIS — M5030 Other cervical disc degeneration, unspecified cervical region: Secondary | ICD-10-CM

## 2017-11-28 MED ORDER — TRIAMCINOLONE ACETONIDE 40 MG/ML IJ SUSP
60.0000 mg | INTRAMUSCULAR | Status: AC | PRN
  Administered 2017-11-28: 60 mg via INTRA_ARTICULAR

## 2017-11-28 MED ORDER — LIDOCAINE HCL 1 % IJ SOLN
3.0000 mL | INTRAMUSCULAR | Status: AC | PRN
  Administered 2017-11-28: 3 mL

## 2017-11-28 NOTE — Patient Instructions (Signed)
Standing Labs We placed an order today for your standing lab work.    Please come back and get your standing labs in September and every 3 months   We have open lab Monday through Friday from 8:30-11:30 AM and 1:30-4:00 PM  at the office of Dr. Odena Mcquaid.   You may experience shorter wait times on Monday and Friday afternoons. The office is located at [address], [address], [place], [place] [address] No appointment is necessary.   Labs are drawn by [facility].  You may receive a bill from [facility] for your lab work. If you have any questions regarding directions or hours of operation,  please call [phone].    

## 2017-11-28 NOTE — Telephone Encounter (Signed)
Last visit: 11/28/17 Next Visit: 03/10/18 Labs: 10/01/17 stable  Okay to refill per Dr. Estanislado Pandy

## 2017-11-29 LAB — COMPLETE METABOLIC PANEL WITH GFR
AG Ratio: 1.5 (calc) (ref 1.0–2.5)
ALBUMIN MSPROF: 4.3 g/dL (ref 3.6–5.1)
ALKALINE PHOSPHATASE (APISO): 67 U/L (ref 40–115)
ALT: 11 U/L (ref 9–46)
AST: 15 U/L (ref 10–35)
BUN: 11 mg/dL (ref 7–25)
CO2: 28 mmol/L (ref 20–32)
CREATININE: 1.01 mg/dL (ref 0.70–1.33)
Calcium: 9.7 mg/dL (ref 8.6–10.3)
Chloride: 101 mmol/L (ref 98–110)
GFR, EST AFRICAN AMERICAN: 97 mL/min/{1.73_m2} (ref >=60)
GFR, EST NON AFRICAN AMERICAN: 83 mL/min/{1.73_m2} (ref >=60)
GLOBULIN: 2.8 g/dL (ref 1.9–3.7)
Glucose, Bld: 82 mg/dL (ref 65–99)
Potassium: 5 mmol/L (ref 3.5–5.3)
SODIUM: 138 mmol/L (ref 135–146)
Total Bilirubin: 0.4 mg/dL (ref 0.2–1.2)
Total Protein: 7.1 g/dL (ref 6.1–8.1)

## 2017-11-29 LAB — CBC WITH DIFFERENTIAL/PLATELET
Basophils Absolute: 94 cells/uL (ref 0–200)
Basophils Relative: 0.8 %
EOS ABS: 507 {cells}/uL — AB (ref 15–500)
EOS PCT: 4.3 %
HEMATOCRIT: 43 % (ref 38.5–50.0)
HEMOGLOBIN: 14.6 g/dL (ref 13.2–17.1)
LYMPHS ABS: 1900 {cells}/uL (ref 850–3900)
MCH: 31.1 pg (ref 27.0–33.0)
MCHC: 34 g/dL (ref 32.0–36.0)
MCV: 91.7 fL (ref 80.0–100.0)
MPV: 10 fL (ref 7.5–12.5)
Monocytes Relative: 8.1 %
NEUTROS ABS: 8343 {cells}/uL — AB (ref 1500–7800)
Neutrophils Relative %: 70.7 %
Platelets: 522 10*3/uL — ABNORMAL HIGH (ref 140–400)
RBC: 4.69 10*6/uL (ref 4.20–5.80)
RDW: 14.4 % (ref 11.0–15.0)
Total Lymphocyte: 16.1 %
WBC mixed population: 956 cells/uL — ABNORMAL HIGH (ref 200–950)
WBC: 11.8 10*3/uL — ABNORMAL HIGH (ref 3.8–10.8)

## 2018-02-17 ENCOUNTER — Other Ambulatory Visit: Payer: Self-pay | Admitting: Rheumatology

## 2018-02-17 NOTE — Telephone Encounter (Signed)
Last visit: 11/28/17 Next Visit: 03/10/18 Labs: 11/28/17 stable  Okay to refill per Dr. Estanislado Pandy

## 2018-02-25 NOTE — Progress Notes (Signed)
Office Visit Note  Patient: Kenneth Payne             Date of Birth: [birth year]           MRN: [id number]             PCP: Wenda Low, MD Referring: Wenda Low, MD Visit Date: 03/10/2018 Occupation: @GUAROCC @  Subjective:  Right middle trigger finger   History of Present Illness: Kenneth Payne is a 57 y.o. male with history of ulcerative colitis, DDD, and inflammatory arthritis.  He injects MTX 1.0 ml once weekly, folic acid 2 mg po daily, and Humira 40 mg sq every 14 days.  He denies any recent ulcerative colitis flares.  He has no abdominal pain, diarrhea, constipation recently.  He has not had any blood in his stool.  He continues to see Dr. Collene Mares on a regular basis.  He has not missed any doses of his medications and is tolerating them well.  He states he continues to have a right middle trigger finger which she applies Voltaren gel as needed.  He states that he is not ready for cortisone injection at this time.  He states that he continues to have intermittent right knee swelling especially if he is tender points bedtime.  He states he also stiffness periodically throughout the day.  He states that his last aspiration in June provided significant relief.  He states he has not had been on prednisone since spring time.  He denies any other joint pain or joint swelling at this time.    Activities of Daily Living:  Patient reports morning stiffness for minutes.   Patient Denies nocturnal pain.  Difficulty dressing/grooming: Denies Difficulty climbing stairs: Denies Difficulty getting out of chair: Denies Difficulty using hands for taps, buttons, cutlery, and/or writing: Denies  Review of Systems  Constitutional: Negative for fatigue and night sweats.  HENT: Negative for mouth sores, trouble swallowing, trouble swallowing, mouth dryness and nose dryness.   Eyes: Negative for redness, visual disturbance and dryness.  Respiratory: Negative for cough, hemoptysis, shortness of breath  and difficulty breathing.   Cardiovascular: Negative for chest pain, palpitations, hypertension, irregular heartbeat and swelling in legs/feet.  Gastrointestinal: Negative for blood in stool, constipation and diarrhea.  Endocrine: Negative for increased urination.  Genitourinary: Negative for painful urination.  Musculoskeletal: Positive for joint swelling. Negative for arthralgias, joint pain, myalgias, muscle weakness, morning stiffness, muscle tenderness and myalgias.  Skin: Negative for color change, rash, hair loss, nodules/bumps, skin tightness, ulcers and sensitivity to sunlight.  Allergic/Immunologic: Negative for susceptible to infections.  Neurological: Negative for dizziness, fainting, memory loss, night sweats and weakness.  Hematological: Negative for swollen glands.  Psychiatric/Behavioral: Negative for depressed mood and sleep disturbance. The patient is not nervous/anxious.     PMFS History:  Patient Active Problem List   Diagnosis Date Noted  . High risk medication use 05/25/2016  . Pain, neck 05/25/2016  . Arthralgia of both hands 05/25/2016  . Pain of both sacroiliac joints 05/25/2016  . Neck mass   . Thrombocytosis ([place]) 08/20/2013  . Normocytic anemia 08/19/2013  . Ulcerative colitis ([place]) 08/16/2013  . Ulcerative colitis, acute ([place]) 08/16/2013  . History of leukocytosis 08/16/2013    Past Medical History:  Diagnosis Date  . Rheumatoid arthritis ([place])   . Ulcerative colitis ([place])     Family History  Problem Relation Age of Onset  . Asthma Mother   . Hypertension Mother   . Parkinson's disease Father   .  Hypertension Father   . Stroke Father   . Heart attack Maternal Grandmother   . Heart attack Maternal Grandfather   . Stroke Paternal Grandmother   . Heart attack Paternal Grandfather   . Asthma Daughter    Past Surgical History:  Procedure Laterality Date  . COLONOSCOPY N/A 08/18/2013   Procedure: COLONOSCOPY;  Surgeon: Winfield Cunas., MD;   Location: [facility];  Service: Endoscopy;  Laterality: N/A;  . CYST REMOVAL NECK    . HERNIA REPAIR     2009   Social History   Social History Narrative  . Not on file    Objective: Vital Signs: BP (!) 161/91 (BP Location: Right Arm, Patient Position: Sitting, Cuff Size: Normal)   Pulse 82   Resp 14   Ht 5' 7"  (1.702 m)   Wt 181 lb 9.6 oz (82.4 kg)   BMI 28.44 kg/m    Physical Exam  Constitutional: He is oriented to person, place, and time. He appears well-developed and well-nourished.  HENT:  Head: Normocephalic and atraumatic.  Eyes: Pupils are equal, round, and reactive to light. Conjunctivae and EOM are normal.  Neck: Normal range of motion. Neck supple.  Cardiovascular: Normal rate, regular rhythm and normal heart sounds.  Pulmonary/Chest: Effort normal and breath sounds normal.  Abdominal: Soft. Bowel sounds are normal.  Lymphadenopathy:    He has no cervical adenopathy.  Neurological: He is alert and oriented to person, place, and time.  Skin: Skin is warm and dry. Capillary refill takes less than 2 seconds.  Psychiatric: He has a normal mood and affect. His behavior is normal.  Nursing note and vitals reviewed.    Musculoskeletal Exam: C-spine slightly limited range of motion.  Thoracic and lumbar spine good range of motion.  No midline spinal tenderness.  No SI joint tenderness.  Shoulder joints, elbow joints, wrist joints, MCPs, PIPs, DIPs good range of motion with no synovitis.  He has PIP and DIP synovial thickening consistent with osteoarthritis of bilateral hands.  Hip joints, knee joints, ankle joints, MTPs, PIPs, DIPs good range of motion with no synovitis.  He has very mild swelling of his right knee but no warmth is noted.  No warmth or effusion of left knee joint.  No tenderness or swelling of ankle joints.  No tenderness of trochanter bursa bilaterally.  CDAI Exam: CDAI Score: Not documented Patient Global Assessment: Not documented; Provider Global  Assessment: Not documented Swollen: Not documented; Tender: Not documented Joint Exam   Not documented   There is currently no information documented on the homunculus. Go to the Rheumatology activity and complete the homunculus joint exam.  Investigation: No additional findings.  Imaging: No results found.  Recent Labs: Lab Results  Component Value Date   WBC 11.8 (H) 11/28/2017   HGB 14.6 11/28/2017   PLT 522 (H) 11/28/2017   NA 138 11/28/2017   K 5.0 11/28/2017   CL 101 11/28/2017   CO2 28 11/28/2017   GLUCOSE 82 11/28/2017   BUN 11 11/28/2017   CREATININE 1.01 11/28/2017   BILITOT 0.4 11/28/2017   ALKPHOS 68 02/13/2017   AST 15 11/28/2017   ALT 11 11/28/2017   PROT 7.1 11/28/2017   ALBUMIN 4.4 02/13/2017   CALCIUM 9.7 11/28/2017   GFRAA 97 11/28/2017   QFTBGOLDPLUS NEGATIVE 06/27/2017    Speciality Comments: No specialty comments available.  Procedures:  No procedures performed Allergies: Augmentin [amoxicillin-pot clavulanate] and Flagyl [metronidazole]   Assessment / Plan:  Visit Diagnoses: Other ulcerative colitis with other complication [facility]): He has not had any recent ulcerative colitis flares.  He continues to follow-up with Dr. Collene Mares on a regular basis.  He has no abdominal tenderness to palpation.  He has not had any recent diarrhea, constipation, or blood in his stool.  He is going to continue injecting Humira 40 mg of cutaneously every 14 days.  High risk medication use - Methotrexate 1 mL subcutaneous every week, folic acid 2 mg by mouth daily, Humira 78m sq every 14 days (prescribed by Dr MCollene Mares.  CBC and CMP will be drawn today to monitor for drug toxicity.  Labs will be forwarded to Dr. MCollene Mares- Plan: COMPLETE METABOLIC PANEL WITH GFR, CBC with Differential/Platelet  Inflammatory arthritis: He has no synovitis on exam.  He continues to have intermittent right knee swelling and stiffness.  He has no warmth on exam today.  He has mild swelling of his  right knee joint.  He is encouraged to use Voltaren gel as needed if he develops increased joint pain or stiffness.  He will continue on Humira 40 mg of cutaneous injections every 14 days, methotrexate 1 mL subcu days and once weekly and folic acid 2 mg daily.  He was advised to notify [place] he develops increased joint pain or joint swelling.  He will follow-up in the office in 5 months.  Effusion of right knee: No warmth noted.  He has a mild swelling of his right knee joint.  He continues to have intermittent joint stiffness.  He has no discomfort at this time.  DDD (degenerative disc disease), cervical: He has slightly limited range of motion of his C-spine.  He has no symptoms of radiculopathy at this time.  He was given a handout of neck exercises that he can perform at home.  Trigger middle finger of right hand: He has tenderness on exam.  He declined a cortisone injection today in the office.  He was encouraged to continue use of Voltaren gel as well as splinting.  He is going to schedule a ultrasound-guided cortisone injection in the future.  Normocytic anemia: CBC was ordered today  Thrombocytosis ([place] : CBC was ordered today  Orders: Orders Placed This Encounter  Procedures  . COMPLETE METABOLIC PANEL WITH GFR  . CBC with Differential/Platelet   Meds ordered this encounter  Medications  . TUBERCULIN SYR 1CC/27GX1/2" (B-D TB SYRINGE 1CC/27GX1/2") 27G X 1/2" 1 ML MISC    Sig: USE 1 SYRINGE WEEKLY TO INJECT METHOTREXATE    Dispense:  12 each    Refill:  1     Follow-Up Instructions: Return in about 5 months (around 08/10/2018) for Ulcerative Colitis, DDD, Inflammatory arthritis .   TOfilia Neas PA-C   I examined and evaluated the patient with THazel SamsPA.  He had right middle trigger finger on my exam today.  I offered cortisone injection but he wants to wait until he comes back from vacation.  He is done quite well since he had cortisone injection.  He still have some  discomfort in his right knee joint without much warmth.  Mild swelling was noted.  At this point he wants to try Voltaren gel.  The plan of care was discussed as noted above.  SBo Merino MD  Note - This record has been created using DEditor, commissioning  Chart creation errors have been sought, but may not always  have been located. Such creation errors do not reflect on  the standard  of medical care.

## 2018-03-06 ENCOUNTER — Ambulatory Visit: Payer: Self-pay | Admitting: Nurse Practitioner

## 2018-03-06 VITALS — BP 140/90 | HR 97 | Temp 98.1°F | Resp 16 | Wt 178.0 lb

## 2018-03-06 DIAGNOSIS — H1013 Acute atopic conjunctivitis, bilateral: Secondary | ICD-10-CM

## 2018-03-06 MED ORDER — MONTELUKAST SODIUM 10 MG PO TABS: 10.0000 mg | ORAL_TABLET | Freq: Every day | ORAL | 0 refills | Status: DC

## 2018-03-06 MED ORDER — OLOPATADINE HCL 0.2 % OP SOLN: 2.0000 [drp] | Freq: Every day | OPHTHALMIC | 0 refills | Status: AC

## 2018-03-06 NOTE — Patient Instructions (Signed)
Allergic Conjunctivitis, Adult      Allergic conjunctivitis is inflammation of the clear membrane that covers the white part of your eye and the inner surface of your eyelid (conjunctiva). The inflammation is caused by allergies. The blood vessels in the conjunctiva become inflamed and this causes the eyes to become red or pink. The eyes often feel itchy. Allergic conjunctivitis cannot be spread from one person to another person (is not contagious). What are the causes? This condition is caused by an allergic reaction. Common causes of an allergic reaction (allergens) include:  Outdoor allergens, such as: ? Pollen. ? Grass and weeds. ? Mold spores.  Indoor allergens, such as: ? Dust. ? Smoke. ? Mold. ? Pet dander. ? Animal hair.  What increases the risk? You may be more likely to develop this condition if you have a family history of allergies, such as:  Allergic rhinitis.  Bronchial asthma.  Atopic dermatitis.  What are the signs or symptoms? Symptoms of this condition include eyes that are:  Itchy.  Red.  Watery.  Puffy.  Your eyes may also:  Sting or burn.  Have clear drainage coming from them.  How is this diagnosed? This condition may be diagnosed by medical history and physical exam. If you have drainage from your eyes, it may be tested to rule out other causes of conjunctivitis. You may also need to see a health care provider who specializes in treating allergies (allergist) or eye conditions (ophthalmologist) for tests to confirm the diagnosis. You may have:  Skin tests to see which allergens are causing your symptoms. These tests involve pricking the skin with a tiny needle and exposing the skin to small amounts of potential allergens to see if your skin reacts.  Blood tests.  Tissue scrapings from your eyelid. These will be examined under a microscope.  How is this treated? Treatments for this condition may include:  Cold cloths (compresses) to  soothe itching and swelling.  Washing the face to remove allergens.  Eye drops. These may be prescription or over-the-counter. There are several different types. You may need to try different types to see which one works best for you. Your may need: ? Eye drops that block the allergic reaction (antihistamine). ? Eye drops that reduce swelling and irritation (anti-inflammatory). ? Steroid eye drops to lessen a severe reaction (vernal conjunctivitis).  Oral antihistamine medicines to reduce your allergic reaction. You may need these if eye drops do not help or are difficult to use.  Follow these instructions at home:  Avoid known allergens whenever possible.  Take or apply over-the-counter and prescription medicines only as told by your health care provider. These include any eye drops.  Apply a cool, clean washcloth to your eye for 10-20 minutes, 3-4 times a day.  Do not touch or rub your eyes.  Do not wear contact lenses until the inflammation is gone. Wear glasses instead.  Do not wear eye makeup until the inflammation is gone.  Keep all follow-up visits as told by your health care provider. This is important. Contact a health care provider if:  Your symptoms get worse or do not improve with treatment.  You have mild eye pain.  You have sensitivity to light.  You have spots or blisters on your eyes.  You have pus draining from your eye.  You have a fever. Get help right away if:  You have redness, swelling, or other symptoms in only one eye.  Your vision is blurred or you have  vision changes.  You have severe eye pain. This information is not intended to replace advice given to you by your health care provider. Make sure you discuss any questions you have with your health care provider. Document Released: 09/01/2002 Document Revised: 02/08/2016 Document Reviewed: 12/23/2015 Elsevier Interactive Patient Education  2018 [employer].

## 2018-03-06 NOTE — Progress Notes (Signed)
   Subjective:    Patient ID: Kenneth Payne, male    DOB: 1960/09/10, 57 y.o.   MRN: [id number]  The patient is a 57 year old male who presents today for complaints of right eye itching and redness.  Patient states he wants to make sure he does not have "pinkeye".  Patient states symptoms started over the last 2 days, when he noticed increased drainage from the right eye.  Patient also informs that the itching has worsened.  Patient states over the last 2 mornings he has awakened with his eyes "matted" shut.  The patient denies continuous drainage from the right eye throughout the day.  Patient also denies swelling to the right eye.  Patient denies any fever, chills, decreased vision, sore throat, or sinus pressure.  Patient states that he does sound "nasally".  Patient also has a history of seasonal allergies.  The patient does wear contact lenses, but has been wearing his eyeglasses since the onset of symptoms.  The patient states he is now noticing the same symptoms in the left eye.   Conjunctivitis   The current episode started 2 days ago. The onset was sudden. Nothing aggravates the symptoms. Associated symptoms include eye itching, congestion, eye discharge and eye redness. Pertinent negatives include no fever, no photophobia and no eye pain. Severity: Denies pain at this time. The right eye is affected. The eye pain is not associated with movement. The eyelid exhibits redness.   I reviewed the patient's past medical history, current medications, and allergies.   Review of Systems  Constitutional: Negative.  Negative for fever.  HENT: Positive for congestion.   Eyes: Positive for discharge, redness and itching. Negative for photophobia, pain and visual disturbance.  Respiratory: Negative.   Cardiovascular: Negative.   Skin: Negative.   Allergic/Immunologic: Positive for environmental allergies.  Neurological: Negative.        Objective:   Physical Exam  Constitutional: He is oriented to  person, place, and time. He appears well-developed and well-nourished. No distress.  HENT:  Head: Normocephalic and atraumatic.  Right Ear: External ear normal.  Left Ear: External ear normal.  Eyes: Pupils are equal, round, and reactive to light. EOM are normal. Right eye exhibits discharge (no discharge notes. no redness or swelling,). No scleral icterus.  Mild redness to the right eye  Neck: Normal range of motion. Neck supple. No thyromegaly present.  Cardiovascular: Normal rate, regular rhythm and normal heart sounds.  Pulmonary/Chest: Effort normal and breath sounds normal.  Neurological: He is alert and oriented to person, place, and time.  Skin: Skin is warm and dry. Capillary refill takes less than 2 seconds.  Psychiatric: He has a normal mood and affect. His behavior is normal.  Vitals reviewed.      Assessment & Plan:  Allergic conjunctivitis of both eyes  Exam findings, diagnosis etiology and medication use and indications reviewed with patient. Follow- Up and discharge instructions provided. No emergent/urgent issues found on exam.  Patient education was provided.Patient verbalized understanding of information provided and agrees with plan of care (POC), all questions answered. The patient is advised to call or return to clinic if he does not see an improvement in symptoms, or to seek the care of the closest emergency department if he worsens with the above plan.   1. Allergic conjunctivitis of both eyes  - Olopatadine HCl (PATADAY) 0.2 % SOLN; Apply 2 drops to eye daily for 10 days.  Dispense: 2.5 mL; Refill: 0

## 2018-03-10 ENCOUNTER — Encounter: Payer: Self-pay | Admitting: Rheumatology

## 2018-03-10 ENCOUNTER — Ambulatory Visit: Payer: PRIVATE HEALTH INSURANCE | Admitting: Rheumatology

## 2018-03-10 VITALS — BP 161/91 | HR 82 | Resp 14 | Ht 67.0 in | Wt 181.6 lb

## 2018-03-10 DIAGNOSIS — D473 Essential (hemorrhagic) thrombocythemia: Secondary | ICD-10-CM

## 2018-03-10 DIAGNOSIS — K51818 Other ulcerative colitis with other complication: Secondary | ICD-10-CM | POA: Diagnosis not present

## 2018-03-10 DIAGNOSIS — M5030 Other cervical disc degeneration, unspecified cervical region: Secondary | ICD-10-CM

## 2018-03-10 DIAGNOSIS — M1990 Unspecified osteoarthritis, unspecified site: Secondary | ICD-10-CM

## 2018-03-10 DIAGNOSIS — D649 Anemia, unspecified: Secondary | ICD-10-CM

## 2018-03-10 DIAGNOSIS — M25461 Effusion, right knee: Secondary | ICD-10-CM | POA: Diagnosis not present

## 2018-03-10 DIAGNOSIS — M65331 Trigger finger, right middle finger: Secondary | ICD-10-CM

## 2018-03-10 DIAGNOSIS — Z79899 Other long term (current) drug therapy: Secondary | ICD-10-CM | POA: Diagnosis not present

## 2018-03-10 DIAGNOSIS — D75839 Thrombocytosis, unspecified: Secondary | ICD-10-CM

## 2018-03-10 MED ORDER — "TUBERCULIN SYRINGE 27G X 1/2"" 1 ML MISC": 1 refills | Status: DC

## 2018-03-10 NOTE — Patient Instructions (Signed)
Neck Exercises Neck exercises can be important for many reasons:  They can help you to improve and maintain flexibility in your neck. This can be especially important as you age.  They can help to make your neck stronger. This can make movement easier.  They can reduce or prevent neck pain.  They may help your upper back.  Ask your health care provider which neck exercises would be best for you. Exercises Neck Press Repeat this exercise 10 times. Do it first thing in the morning and right before bed or as told by your health care provider. 1. Lie on your back on a firm bed or on the floor with a pillow under your head. 2. Use your neck muscles to push your head down on the pillow and straighten your spine. 3. Hold the position as well as you can. Keep your head facing up and your chin tucked. 4. Slowly count to 5 while holding this position. 5. Relax for a few seconds. Then repeat.  Isometric Strengthening Do a full set of these exercises 2 times a day or as told by your health care provider. 1. Sit in a supportive chair and place your hand on your forehead. 2. Push forward with your head and neck while pushing back with your hand. Hold for 10 seconds. 3. Relax. Then repeat the exercise 3 times. 4. Next, do thesequence again, this time putting your hand against the back of your head. Use your head and neck to push backward against the hand pressure. 5. Finally, do the same exercise on either side of your head, pushing sideways against the pressure of your hand.  Prone Head Lifts Repeat this exercise 5 times. Do this 2 times a day or as told by your health care provider. 1. Lie face-down, resting on your elbows so that your chest and upper back are raised. 2. Start with your head facing downward, near your chest. Position your chin either on or near your chest. 3. Slowly lift your head upward. Lift until you are looking straight ahead. Then continue lifting your head as far back as  you can stretch. 4. Hold your head up for 5 seconds. Then slowly lower it to your starting position.  Supine Head Lifts Repeat this exercise 8-10 times. Do this 2 times a day or as told by your health care provider. 1. Lie on your back, bending your knees to point to the ceiling and keeping your feet flat on the floor. 2. Lift your head slowly off the floor, raising your chin toward your chest. 3. Hold for 5 seconds. 4. Relax and repeat.  Scapular Retraction Repeat this exercise 5 times. Do this 2 times a day or as told by your health care provider. 1. Stand with your arms at your sides. Look straight ahead. 2. Slowly pull both shoulders backward and downward until you feel a stretch between your shoulder blades in your upper back. 3. Hold for 10-30 seconds. 4. Relax and repeat.  Contact a health care provider if:  Your neck pain or discomfort gets much worse when you do an exercise.  Your neck pain or discomfort does not improve within 2 hours after you exercise. If you have any of these problems, stop exercising right away. Do not do the exercises again unless your health care provider says that you can. Get help right away if:  You develop sudden, severe neck pain. If this happens, stop exercising right away. Do not do the exercises again unless your   health care provider says that you can. Exercises Neck Stretch  Repeat this exercise 3-5 times. 1. Do this exercise while standing or while sitting in a chair. 2. Place your feet flat on the floor, shoulder-width apart. 3. Slowly turn your head to the right. Turn it all the way to the right so you can look over your right shoulder. Do not tilt or tip your head. 4. Hold this position for 10-30 seconds. 5. Slowly turn your head to the left, to look over your left shoulder. 6. Hold this position for 10-30 seconds.  Neck Retraction Repeat this exercise 8-10 times. Do this 3-4 times a day or as told by your health care  provider. 1. Do this exercise while standing or while sitting in a sturdy chair. 2. Look straight ahead. Do not bend your neck. 3. Use your fingers to push your chin backward. Do not bend your neck for this movement. Continue to face straight ahead. If you are doing the exercise properly, you will feel a slight sensation in your throat and a stretch at the back of your neck. 4. Hold the stretch for 1-2 seconds. Relax and repeat.  This information is not intended to replace advice given to you by your health care provider. Make sure you discuss any questions you have with your health care provider. Document Released: 05/23/2015 Document Revised: 11/17/2015 Document Reviewed: 12/20/2014 Elsevier Interactive Patient Education  2018 [employer]  

## 2018-03-11 LAB — COMPLETE METABOLIC PANEL WITH GFR
AG Ratio: 2 (calc) (ref 1.0–2.5)
ALKALINE PHOSPHATASE (APISO): 67 U/L (ref 40–115)
ALT: 16 U/L (ref 9–46)
AST: 16 U/L (ref 10–35)
Albumin: 4.6 g/dL (ref 3.6–5.1)
BUN: 13 mg/dL (ref 7–25)
CO2: 28 mmol/L (ref 20–32)
Calcium: 9.7 mg/dL (ref 8.6–10.3)
Chloride: 104 mmol/L (ref 98–110)
Creat: 1.03 mg/dL (ref 0.70–1.33)
GFR, EST AFRICAN AMERICAN: 94 mL/min/{1.73_m2} (ref >=60)
GFR, Est Non African American: 81 mL/min/{1.73_m2} (ref >=60)
GLUCOSE: 73 mg/dL (ref 65–99)
Globulin: 2.3 g/dL (calc) (ref 1.9–3.7)
Potassium: 4.8 mmol/L (ref 3.5–5.3)
Sodium: 139 mmol/L (ref 135–146)
TOTAL PROTEIN: 6.9 g/dL (ref 6.1–8.1)
Total Bilirubin: 0.7 mg/dL (ref 0.2–1.2)

## 2018-03-11 LAB — CBC WITH DIFFERENTIAL/PLATELET
BASOS ABS: 82 {cells}/uL (ref 0–200)
Basophils Relative: 0.9 %
EOS PCT: 3 %
Eosinophils Absolute: 273 cells/uL (ref 15–500)
HEMATOCRIT: 42.5 % (ref 38.5–50.0)
Hemoglobin: 14.1 g/dL (ref 13.2–17.1)
LYMPHS ABS: 1747 {cells}/uL (ref 850–3900)
MCH: 30.7 pg (ref 27.0–33.0)
MCHC: 33.2 g/dL (ref 32.0–36.0)
MCV: 92.6 fL (ref 80.0–100.0)
MPV: 10.4 fL (ref 7.5–12.5)
Monocytes Relative: 8.2 %
NEUTROS PCT: 68.7 %
Neutro Abs: 6252 cells/uL (ref 1500–7800)
Platelets: 336 10*3/uL (ref 140–400)
RBC: 4.59 10*6/uL (ref 4.20–5.80)
RDW: 15.2 % — AB (ref 11.0–15.0)
Total Lymphocyte: 19.2 %
WBC mixed population: 746 cells/uL (ref 200–950)
WBC: 9.1 10*3/uL (ref 3.8–10.8)

## 2018-03-11 NOTE — Progress Notes (Signed)
Labs are WNL.

## 2018-03-31 ENCOUNTER — Other Ambulatory Visit: Payer: Self-pay | Admitting: Nurse Practitioner

## 2018-04-14 ENCOUNTER — Other Ambulatory Visit: Payer: Self-pay | Admitting: Rheumatology

## 2018-04-15 NOTE — Telephone Encounter (Signed)
Last visit: 03/10/2018 Next visit: 08/11/2018 Labs: 03/10/2018 WNL   Okay to refill per Dr. Estanislado Pandy.

## 2018-04-16 ENCOUNTER — Other Ambulatory Visit: Payer: PRIVATE HEALTH INSURANCE | Admitting: Rheumatology

## 2018-06-10 ENCOUNTER — Other Ambulatory Visit: Payer: Self-pay | Admitting: Rheumatology

## 2018-06-11 NOTE — Telephone Encounter (Signed)
Last visit: 03/10/2018 Next visit: 08/11/2018  Okay to refill per Dr. Estanislado Pandy

## 2018-07-04 ENCOUNTER — Encounter: Payer: Self-pay | Admitting: Family Medicine

## 2018-07-04 ENCOUNTER — Encounter: Payer: Self-pay | Admitting: Rheumatology

## 2018-07-28 NOTE — Progress Notes (Signed)
Office Visit Note  Patient: Kenneth Payne             Date of Birth: Jul 20, 1960           MRN: [id number]             PCP: Wenda Low, MD Referring: Wenda Low, MD Visit Date: 08/11/2018 Occupation: @GUAROCC @  Subjective:  Medication management.    History of Present Illness: Kenneth Payne is a 58 y.o. male with history of ulcerative colitis.  He states about 2 months ago Dr. Collene Mares switched him from [place] to [place].  He is on Xeljanz 10 mg p.o. twice daily.  He is a still taking methotrexate 1 mL subcutaneously.  He states he has some joint discomfort but no joint swelling since the last visit.  We had aspirated his right knee joint twice in the last 1 year.  Activities of Daily Living:  Patient reports morning stiffness for 0 minute.   Patient Denies nocturnal pain.  Difficulty dressing/grooming: Denies Difficulty climbing stairs: Denies Difficulty getting out of chair: Denies Difficulty using hands for taps, buttons, cutlery, and/or writing: Denies  Review of Systems  Constitutional: Negative for fatigue and night sweats.  HENT: Negative for mouth sores, mouth dryness and nose dryness.   Eyes: Negative for redness and dryness.  Respiratory: Negative for shortness of breath and difficulty breathing.   Cardiovascular: Negative for chest pain, palpitations, hypertension, irregular heartbeat and swelling in legs/feet.  Gastrointestinal: Negative for blood in stool, constipation and diarrhea.  Endocrine: Negative for increased urination.  Musculoskeletal: Positive for arthralgias and joint pain. Negative for joint swelling, myalgias, muscle weakness, morning stiffness, muscle tenderness and myalgias.  Skin: Negative for color change, rash, hair loss, nodules/bumps, skin tightness, ulcers and sensitivity to sunlight.  Allergic/Immunologic: Negative for susceptible to infections.  Neurological: Negative for dizziness, fainting, memory loss, night sweats and weakness ( ).    Hematological: Negative for swollen glands.  Psychiatric/Behavioral: Negative for depressed mood and sleep disturbance. The patient is not nervous/anxious.     PMFS History:  Patient Active Problem List   Diagnosis Date Noted  . High risk medication use 05/25/2016  . Pain, neck 05/25/2016  . Arthralgia of both hands 05/25/2016  . Pain of both sacroiliac joints 05/25/2016  . Neck mass   . Thrombocytosis ([place]) 08/20/2013  . Normocytic anemia 08/19/2013  . Ulcerative colitis ([place]) 08/16/2013  . Ulcerative colitis, acute ([place]) 08/16/2013  . History of leukocytosis 08/16/2013    Past Medical History:  Diagnosis Date  . Rheumatoid arthritis ([place])   . Ulcerative colitis ([place])     Family History  Problem Relation Age of Onset  . Asthma Mother   . Hypertension Mother   . Parkinson's disease Father   . Hypertension Father   . Stroke Father   . Heart attack Maternal Grandmother   . Heart attack Maternal Grandfather   . Stroke Paternal Grandmother   . Heart attack Paternal Grandfather   . Asthma Daughter    Past Surgical History:  Procedure Laterality Date  . COLONOSCOPY N/A 08/18/2013   Procedure: COLONOSCOPY;  Surgeon: Winfield Cunas., MD;  Location: [facility];  Service: Endoscopy;  Laterality: N/A;  . CYST REMOVAL NECK    . HERNIA REPAIR     2009   Social History   Social History Narrative  . Not on file   Immunization History  Administered Date(s) Administered  . Zoster Recombinat (Shingrix) 07/10/2018     Objective: Vital  Signs: BP 130/80 (BP Location: Left Arm, Patient Position: Sitting, Cuff Size: Normal)   Pulse 93   Resp 13   Ht 5' 7"  (1.702 m)   Wt 182 lb (82.6 kg)   BMI 28.51 kg/m    Physical Exam Vitals signs and nursing note reviewed.  Constitutional:      Appearance: He is well-developed.  HENT:     Head: Normocephalic and atraumatic.  Eyes:     Conjunctiva/sclera: Conjunctivae normal.     Pupils: Pupils are equal, round, and reactive  to light.  Neck:     Musculoskeletal: Normal range of motion and neck supple.  Cardiovascular:     Rate and Rhythm: Normal rate and regular rhythm.     Heart sounds: Normal heart sounds.  Pulmonary:     Effort: Pulmonary effort is normal.     Breath sounds: Normal breath sounds.  Abdominal:     General: Bowel sounds are normal.     Palpations: Abdomen is soft.  Skin:    General: Skin is warm and dry.     Capillary Refill: Capillary refill takes less than 2 seconds.  Neurological:     Mental Status: He is alert and oriented to person, place, and time.  Psychiatric:        Behavior: Behavior normal.      Musculoskeletal Exam: C-spine thoracic lumbar spine good range of motion.  Shoulder joints elbow joints wrist joint MCPs PIPs DIPs been good range of motion.  He has some thickening of PIP and DIP joints consistent with osteoarthritis.  He has right third trigger finger.  Hip joints were in good range of motion.  He has effusion in his right knee joint with some warmth.  All other joints are full range of motion.  CDAI Exam: CDAI Score: Not documented Patient Global Assessment: Not documented; Provider Global Assessment: Not documented Swollen: Not documented; Tender: Not documented Joint Exam   Not documented   There is currently no information documented on the homunculus. Go to the Rheumatology activity and complete the homunculus joint exam.  Investigation: No additional findings.  Imaging: No results found.  Recent Labs: Lab Results  Component Value Date   WBC 7.7 08/04/2018   HGB 14.5 08/04/2018   PLT 390 08/04/2018   NA 138 08/04/2018   K 4.6 08/04/2018   CL 100 08/04/2018   CO2 27 08/04/2018   GLUCOSE 81 08/04/2018   BUN 17 08/04/2018   CREATININE 1.18 08/04/2018   BILITOT 0.5 08/04/2018   ALKPHOS 68 02/13/2017   AST 27 08/04/2018   ALT 30 08/04/2018   PROT 7.7 08/04/2018   ALBUMIN 4.4 02/13/2017   CALCIUM 10.0 08/04/2018   GFRAA 79 08/04/2018    QFTBGOLDPLUS NEGATIVE 06/27/2017    Speciality Comments: No specialty comments available.  Procedures:  No procedures performed Allergies: Augmentin [amoxicillin-pot clavulanate] and Flagyl [metronidazole]   Assessment / Plan:     Visit Diagnoses: Other ulcerative colitis with other complication ([place]) -he was placed on Xeljanz 10 mg p.o. twice daily by Dr. Collene Mares about 2 months ago per patient.  He has noticed great improvement in his GI symptoms.  He is not having any joint discomfort.  Although on my examination he still had right knee joint effusion and some warmth.  It is not bothersome at this point.  High risk medication use -Xeljanz 10 mg p.o. twice daily.  Methotrexate 1.0 mL subcu weekly.  Last TB gold negative on 06/27/2017 and orders placed for today.  Will monitor yearly.  Most recent CBC/CMP within normal limits on 08/04/2018 and will monitor every 3 months.  Standing orders are in place.  Recommend yearly skin exams.- Plan: QuantiFERON-TB Gold Plus  Inflammatory arthritis-he still have some right knee joint effusion and warmth.  He has noticed improvement in his GI symptoms on Xeljanz.  He is not having much joint discomfort.  Although he still has effusion.  I discussed decreasing methotrexate to 0.8 mL subcu weekly.  If he tolerates that and has no worsening of joint symptoms we can decrease the dose further.  Effusion of right knee-patient declined aspiration.  He believes the knee inflammation is related to playing golf.  Trigger middle finger of right hand-knee has been using Voltaren gel.  DDD (degenerative disc disease), cervical  Thrombocytosis (HCC)  Normocytic anemia   Orders: Orders Placed This Encounter  Procedures  . QuantiFERON-TB Gold Plus   No orders of the defined types were placed in this encounter.    Follow-Up Instructions: Return in about 5 months (around 01/09/2019) for UC, Inflammatory arthritis.   Bo Merino, MD  Note - This record has  been created using Editor, commissioning.  Chart creation errors have been sought, but may not always  have been located. Such creation errors do not reflect on  the standard of medical care.

## 2018-08-01 ENCOUNTER — Other Ambulatory Visit: Payer: Self-pay | Admitting: Rheumatology

## 2018-08-01 ENCOUNTER — Other Ambulatory Visit: Payer: Self-pay | Admitting: *Deleted

## 2018-08-01 DIAGNOSIS — M1990 Unspecified osteoarthritis, unspecified site: Secondary | ICD-10-CM

## 2018-08-01 NOTE — Telephone Encounter (Signed)
Last visit: 03/10/2018 Next visit: 08/11/2018 Labs: 03/10/18 WNL  Left message to advise patient he is due for labs.   Okay to refill 30 day supply per Dr. Estanislado Pandy

## 2018-08-04 ENCOUNTER — Other Ambulatory Visit: Payer: Self-pay

## 2018-08-04 DIAGNOSIS — M1990 Unspecified osteoarthritis, unspecified site: Secondary | ICD-10-CM

## 2018-08-05 LAB — CBC WITH DIFFERENTIAL/PLATELET
ABSOLUTE MONOCYTES: 539 {cells}/uL (ref 200–950)
Basophils Absolute: 69 cells/uL (ref 0–200)
Basophils Relative: 0.9 %
EOS ABS: 131 {cells}/uL (ref 15–500)
Eosinophils Relative: 1.7 %
HEMATOCRIT: 43.4 % (ref 38.5–50.0)
Hemoglobin: 14.5 g/dL (ref 13.2–17.1)
Lymphs Abs: 2318 cells/uL (ref 850–3900)
MCH: 31.4 pg (ref 27.0–33.0)
MCHC: 33.4 g/dL (ref 32.0–36.0)
MCV: 93.9 fL (ref 80.0–100.0)
MPV: 10.8 fL (ref 7.5–12.5)
Monocytes Relative: 7 %
Neutro Abs: 4643 cells/uL (ref 1500–7800)
Neutrophils Relative %: 60.3 %
Platelets: 390 10*3/uL (ref 140–400)
RBC: 4.62 10*6/uL (ref 4.20–5.80)
RDW: 13.7 % (ref 11.0–15.0)
Total Lymphocyte: 30.1 %
WBC: 7.7 10*3/uL (ref 3.8–10.8)

## 2018-08-05 LAB — COMPLETE METABOLIC PANEL WITH GFR
AG Ratio: 1.6 (calc) (ref 1.0–2.5)
ALBUMIN MSPROF: 4.7 g/dL (ref 3.6–5.1)
ALKALINE PHOSPHATASE (APISO): 70 U/L (ref 35–144)
ALT: 30 U/L (ref 9–46)
AST: 27 U/L (ref 10–35)
BILIRUBIN TOTAL: 0.5 mg/dL (ref 0.2–1.2)
BUN: 17 mg/dL (ref 7–25)
CHLORIDE: 100 mmol/L (ref 98–110)
CO2: 27 mmol/L (ref 20–32)
Calcium: 10 mg/dL (ref 8.6–10.3)
Creat: 1.18 mg/dL (ref 0.70–1.33)
GFR, Est African American: 79 mL/min/{1.73_m2} (ref >=60)
GFR, Est Non African American: 69 mL/min/{1.73_m2} (ref >=60)
GLOBULIN: 3 g/dL (ref 1.9–3.7)
GLUCOSE: 81 mg/dL (ref 65–99)
Potassium: 4.6 mmol/L (ref 3.5–5.3)
SODIUM: 138 mmol/L (ref 135–146)
Total Protein: 7.7 g/dL (ref 6.1–8.1)

## 2018-08-11 ENCOUNTER — Encounter: Payer: Self-pay | Admitting: Rheumatology

## 2018-08-11 ENCOUNTER — Ambulatory Visit (INDEPENDENT_AMBULATORY_CARE_PROVIDER_SITE_OTHER): Payer: PRIVATE HEALTH INSURANCE | Admitting: Rheumatology

## 2018-08-11 VITALS — BP 130/80 | HR 93 | Resp 13 | Ht 67.0 in | Wt 182.0 lb

## 2018-08-11 DIAGNOSIS — M25461 Effusion, right knee: Secondary | ICD-10-CM | POA: Diagnosis not present

## 2018-08-11 DIAGNOSIS — D649 Anemia, unspecified: Secondary | ICD-10-CM

## 2018-08-11 DIAGNOSIS — K51818 Other ulcerative colitis with other complication: Secondary | ICD-10-CM

## 2018-08-11 DIAGNOSIS — M1990 Unspecified osteoarthritis, unspecified site: Secondary | ICD-10-CM | POA: Diagnosis not present

## 2018-08-11 DIAGNOSIS — M65331 Trigger finger, right middle finger: Secondary | ICD-10-CM

## 2018-08-11 DIAGNOSIS — M5030 Other cervical disc degeneration, unspecified cervical region: Secondary | ICD-10-CM

## 2018-08-11 DIAGNOSIS — D473 Essential (hemorrhagic) thrombocythemia: Secondary | ICD-10-CM

## 2018-08-11 DIAGNOSIS — D75839 Thrombocytosis, unspecified: Secondary | ICD-10-CM

## 2018-08-11 DIAGNOSIS — Z79899 Other long term (current) drug therapy: Secondary | ICD-10-CM

## 2018-08-11 NOTE — Patient Instructions (Addendum)
Standing Labs We placed an order today for your standing lab work.    Please come back and get your standing labs in May and every 3 months  We have open lab Monday through Friday from 8:30-11:30 AM and 1:30-4:00 PM  at the office of Dr. Bo Merino.   You may experience shorter wait times on Monday and Friday afternoons. The office is located at [address], [place] [address], [place], [place] [address] No appointment is necessary.   Labs are drawn by [facility].  You may receive a bill from [facility] for your lab work.  If you wish to have your labs drawn at another location, please call the office 24 hours in advance to send orders.  If you have any questions regarding directions or hours of operation,  please call [phone].   Just as a reminder please drink plenty of water prior to coming for your lab work. Thanks!

## 2018-08-13 LAB — QUANTIFERON-TB GOLD PLUS
Mitogen-NIL: >10 IU/mL
NIL: 0.03 IU/mL
QuantiFERON-TB Gold Plus: NEGATIVE
TB1-NIL: 0 IU/mL
TB2-NIL: <0 IU/mL

## 2018-08-27 ENCOUNTER — Other Ambulatory Visit: Payer: Self-pay | Admitting: Rheumatology

## 2018-08-28 NOTE — Telephone Encounter (Signed)
Last visit: 08/11/18 Next Visit: 01/09/19 Labs: 08/04/18 WNL  Okay to refill per Dr. Estanislado Pandy

## 2018-09-09 ENCOUNTER — Other Ambulatory Visit: Payer: Self-pay | Admitting: Physician Assistant

## 2018-09-09 NOTE — Telephone Encounter (Signed)
Last visit: 08/11/18 Next Visit: 01/09/19  Okay to refill per Dr. Estanislado Pandy

## 2018-09-09 NOTE — Telephone Encounter (Signed)
Okay to give refill on methotrexate syringes

## 2018-11-24 ENCOUNTER — Other Ambulatory Visit: Payer: Self-pay | Admitting: Pharmacist

## 2018-11-24 ENCOUNTER — Other Ambulatory Visit: Payer: Self-pay | Admitting: Rheumatology

## 2018-11-24 DIAGNOSIS — M1990 Unspecified osteoarthritis, unspecified site: Secondary | ICD-10-CM

## 2018-11-24 NOTE — Telephone Encounter (Signed)
Last visit: 08/11/18 Next Visit: 01/09/19 Labs: 08/04/18 WNL  Patient advised he is due for labs. Patient will update labs today.   Okay to refill per Dr. Estanislado Pandy

## 2018-11-25 LAB — CBC WITH DIFFERENTIAL/PLATELET
Absolute Monocytes: 737 cells/uL (ref 200–950)
Basophils Absolute: 49 cells/uL (ref 0–200)
Basophils Relative: 0.6 %
Eosinophils Absolute: 122 cells/uL (ref 15–500)
Eosinophils Relative: 1.5 %
HCT: 40 % (ref 38.5–50.0)
Hemoglobin: 13.7 g/dL (ref 13.2–17.1)
Lymphs Abs: 2163 cells/uL (ref 850–3900)
MCH: 32.1 pg (ref 27.0–33.0)
MCHC: 34.3 g/dL (ref 32.0–36.0)
MCV: 93.7 fL (ref 80.0–100.0)
MPV: 10.5 fL (ref 7.5–12.5)
Monocytes Relative: 9.1 %
Neutro Abs: 5030 cells/uL (ref 1500–7800)
Neutrophils Relative %: 62.1 %
Platelets: 415 10*3/uL — ABNORMAL HIGH (ref 140–400)
RBC: 4.27 10*6/uL (ref 4.20–5.80)
RDW: 15.2 % — ABNORMAL HIGH (ref 11.0–15.0)
Total Lymphocyte: 26.7 %
WBC: 8.1 10*3/uL (ref 3.8–10.8)

## 2018-11-25 LAB — COMPLETE METABOLIC PANEL WITH GFR
AG Ratio: 1.5 (calc) (ref 1.0–2.5)
ALT: 23 U/L (ref 9–46)
AST: 19 U/L (ref 10–35)
Albumin: 4.4 g/dL (ref 3.6–5.1)
Alkaline phosphatase (APISO): 64 U/L (ref 35–144)
BUN: 16 mg/dL (ref 7–25)
CO2: 31 mmol/L (ref 20–32)
Calcium: 10 mg/dL (ref 8.6–10.3)
Chloride: 101 mmol/L (ref 98–110)
Creat: 1.17 mg/dL (ref 0.70–1.33)
GFR, Est African American: 80 mL/min/{1.73_m2} (ref >=60)
GFR, Est Non African American: 69 mL/min/{1.73_m2} (ref >=60)
Globulin: 3 g/dL (calc) (ref 1.9–3.7)
Glucose, Bld: 78 mg/dL (ref 65–99)
Potassium: 4.1 mmol/L (ref 3.5–5.3)
Sodium: 140 mmol/L (ref 135–146)
Total Bilirubin: 0.5 mg/dL (ref 0.2–1.2)
Total Protein: 7.4 g/dL (ref 6.1–8.1)

## 2018-12-16 ENCOUNTER — Telehealth: Payer: Self-pay | Admitting: Hematology & Oncology

## 2018-12-16 NOTE — Telephone Encounter (Signed)
Called and spoke with patient & referring office regarding NP appt date/time/location.

## 2018-12-20 ENCOUNTER — Other Ambulatory Visit: Payer: Self-pay | Admitting: Rheumatology

## 2018-12-22 NOTE — Telephone Encounter (Signed)
Last visit: 08/11/18 Next Visit: 01/09/19 Labs: 11/24/18  CMP WNL. Plts are borderline elevated-[id number].     Okay to refill per Dr. Estanislado Pandy

## 2018-12-25 ENCOUNTER — Encounter: Payer: PRIVATE HEALTH INSURANCE | Admitting: Oncology

## 2018-12-29 NOTE — Progress Notes (Signed)
Office Visit Note  Patient: Kenneth Payne             Date of Birth: 23-Feb-1961           MRN: [id number]             PCP: Wenda Low, MD Referring: Wenda Low, MD Visit Date: 01/09/2019 Occupation: @GUAROCC @  Subjective:  Arthritis (Trigger finger, right 3rd digit)     History of Present Illness: Kenneth Payne is a 58 y.o. male with history of ulcerative colitis and inflammatory arthritis.  He states he is doing well on the combination of Xeljanz and methotrexate.  He states his ulcerative colitis is quiet.  He has intermittent pain in his right knee joint.  He still has right middle trigger finger.  Patient states he was recently evaluated by Dr. Marin Olp for thrombocytosis and was found to have iron deficiency.  He is currently taking iron.  Activities of Daily Living:  Patient reports morning stiffness for 0 none.   Patient Denies nocturnal pain.  Difficulty dressing/grooming: Denies Difficulty climbing stairs: Denies Difficulty getting out of chair: Denies Difficulty using hands for taps, buttons, cutlery, and/or writing: Denies  Review of Systems  Constitutional: Negative for fatigue and night sweats.  HENT: Negative for mouth sores, mouth dryness and nose dryness.   Eyes: Negative for redness and dryness.  Respiratory: Negative for shortness of breath and difficulty breathing.   Cardiovascular: Negative for chest pain, palpitations, hypertension, irregular heartbeat and swelling in legs/feet.  Gastrointestinal: Negative for constipation and diarrhea.  Endocrine: Negative for excessive thirst and increased urination.  Genitourinary: Negative for difficulty urinating.  Musculoskeletal: Positive for joint swelling. Negative for arthralgias, joint pain, myalgias, muscle weakness, morning stiffness, muscle tenderness and myalgias.  Skin: Negative for color change, rash, hair loss, nodules/bumps, skin tightness, ulcers and sensitivity to sunlight.  Allergic/Immunologic:  Negative for susceptible to infections.  Neurological: Negative for dizziness, fainting, numbness, memory loss, night sweats and weakness ( ).  Hematological: Negative for bruising/bleeding tendency and swollen glands.  Psychiatric/Behavioral: Negative for depressed mood and sleep disturbance. The patient is not nervous/anxious.     PMFS History:  Patient Active Problem List   Diagnosis Date Noted  . High risk medication use 05/25/2016  . Pain, neck 05/25/2016  . Arthralgia of both hands 05/25/2016  . Pain of both sacroiliac joints 05/25/2016  . Neck mass   . Thrombocytosis ([place]) 08/20/2013  . Normocytic anemia 08/19/2013  . Ulcerative colitis ([place]) 08/16/2013  . Ulcerative colitis, acute ([place]) 08/16/2013  . History of leukocytosis 08/16/2013    Past Medical History:  Diagnosis Date  . Rheumatoid arthritis ([place])   . Ulcerative colitis ([place])     Family History  Problem Relation Age of Onset  . Asthma Mother   . Hypertension Mother   . Parkinson's disease Father   . Hypertension Father   . Stroke Father   . Heart attack Maternal Grandmother   . Heart attack Maternal Grandfather   . Stroke Paternal Grandmother   . Heart attack Paternal Grandfather   . Asthma Daughter    Past Surgical History:  Procedure Laterality Date  . COLONOSCOPY N/A 08/18/2013   Procedure: COLONOSCOPY;  Surgeon: Winfield Cunas., MD;  Location: [facility];  Service: Endoscopy;  Laterality: N/A;  . CYST REMOVAL NECK    . HERNIA REPAIR     2009   Social History   Social History Narrative  . Not on file   Immunization History  Administered Date(s) Administered  . Zoster Recombinat (Shingrix) 07/10/2018     Objective: Vital Signs: BP 120/72 (BP Location: Left Arm, Patient Position: Sitting, Cuff Size: Normal)   Pulse 86   Resp 14   Ht 5' 7"  (1.702 m)   Wt 190 lb 3.2 oz (86.3 kg)   BMI 29.79 kg/m    Physical Exam Vitals signs and nursing note reviewed.  Constitutional:       Appearance: He is well-developed.  HENT:     Head: Normocephalic and atraumatic.  Eyes:     Conjunctiva/sclera: Conjunctivae normal.     Pupils: Pupils are equal, round, and reactive to light.  Neck:     Musculoskeletal: Normal range of motion and neck supple.  Cardiovascular:     Rate and Rhythm: Normal rate and regular rhythm.     Heart sounds: Normal heart sounds.  Pulmonary:     Effort: Pulmonary effort is normal.     Breath sounds: Normal breath sounds.  Abdominal:     General: Bowel sounds are normal.     Palpations: Abdomen is soft.  Skin:    General: Skin is warm and dry.     Capillary Refill: Capillary refill takes less than 2 seconds.  Neurological:     Mental Status: He is alert and oriented to person, place, and time.  Psychiatric:        Behavior: Behavior normal.      Musculoskeletal Exam: C-spine limited range of motion.  Shoulder joints, elbow joints, wrist joints, MCPs PIPs and DIPs with good range of motion with no synovitis.  He had right middle flexor tenosynovitis with intermittent triggering.  Hip joints were in good range of motion.  He has some warmth in his right knee joint without any effusion.  Ankle joints with good range of motion.  CDAI Exam: CDAI Score: 1.2  Patient Global: 1 mm; Provider Global: 1 mm Swollen: 0 ; Tender: 1  Joint Exam      Right  Left  Knee   Tender        Investigation: No additional findings.  Imaging: No results found.  Recent Labs: Lab Results  Component Value Date   WBC 8.0 12/30/2018   HGB 13.3 12/30/2018   PLT 397 12/30/2018   NA 140 11/24/2018   K 4.1 11/24/2018   CL 101 11/24/2018   CO2 31 11/24/2018   GLUCOSE 78 11/24/2018   BUN 16 11/24/2018   CREATININE 1.17 11/24/2018   BILITOT 0.5 11/24/2018   ALKPHOS 68 02/13/2017   AST 19 11/24/2018   ALT 23 11/24/2018   PROT 7.4 11/24/2018   ALBUMIN 4.4 02/13/2017   CALCIUM 10.0 11/24/2018   GFRAA 80 11/24/2018   QFTBGOLDPLUS NEGATIVE 08/11/2018     Speciality Comments: No specialty comments available.  Procedures:  No procedures performed Allergies: Augmentin [amoxicillin-pot clavulanate] and Flagyl [metronidazole]   Assessment / Plan:     Visit Diagnoses: Other ulcerative colitis with other complication ([place]) -patient is doing extremely well on Xeljanz and methotrexate combination.  He states he had colonoscopy earlier this year which showed no activity.  Inflammatory arthritis -he has had severe inflammatory arthritis in the past but he is doing very well on the current combination.  He still had some warmth in his right knee joint.  But no effusion was noted today.  He does play golf and the pivoting during the game could be contributing to some of the right knee joint discomfort and warmth.  High risk  medication use -  Xeljanz 10 mg 1 tablet twice daily, methotrexate 1 mL every 7 days, and folic acid 1 mg 2 tablets daily.  Last TB gold negative on 08/11/2018 and will monitor yearly.  Last lipid panel within normal limits on 10/01/2017.  Due for lipid panel ordered by PCP and will monitor yearly.  Most recent CBC within normal limits on 12/30/2018.  Most recent CMP within normal limits on 11/24/2018.    Standing orders are in place.  He is received 1 dose of the Shingrix vaccine. Recommend annual influenza, Pneumovax 23, Prevnar 13, and second dose of Shingrix as indicated for immunosuppressant therapy.  Most recent labs in July were normal.  He will need labs in October.  Extra precautions during the pandemic with masking, social distancing and hand sanitizing was emphasized.  He was also advised to discontinue medications in case he develops an infection and he can resume medications once treated for the infection.  Chronic pain of right knee -he has minimal discomfort in his right knee currently.  Trigger middle finger of right hand -he does not want to have cortisone injection.  His splinting of the finger and using Voltaren gel topically was  discussed.  DDD (degenerative disc disease), cervical -he has some limitation with range of motion but not much discomfort currently.  Iron deficiency -his ferritin level was low and he is currently on iron supplement by Dr. Jonette Eva.  Thrombocytosis (H[facility])-recently with levels were normal.  I believe thrombocytosis most likely secondary to iron deficiency.  Normocytic anemia   Orders: No orders of the defined types were placed in this encounter.  No orders of the defined types were placed in this encounter.    Follow-Up Instructions: Return in about 5 months (around 06/11/2019) for UC, inflammatory arthritis.   Bo Merino, MD  Note - This record has been created using Editor, commissioning.  Chart creation errors have been sought, but may not always  have been located. Such creation errors do not reflect on  the standard of medical care.

## 2018-12-30 ENCOUNTER — Inpatient Hospital Stay: Payer: PRIVATE HEALTH INSURANCE | Attending: Hematology & Oncology | Admitting: Hematology & Oncology

## 2018-12-30 ENCOUNTER — Inpatient Hospital Stay: Payer: PRIVATE HEALTH INSURANCE

## 2018-12-30 ENCOUNTER — Encounter: Payer: Self-pay | Admitting: Hematology & Oncology

## 2018-12-30 ENCOUNTER — Other Ambulatory Visit: Payer: Self-pay

## 2018-12-30 VITALS — BP 129/91 | HR 79 | Temp 97.8°F | Resp 18 | Ht 67.0 in | Wt 187.0 lb

## 2018-12-30 DIAGNOSIS — Z880 Allergy status to penicillin: Secondary | ICD-10-CM | POA: Insufficient documentation

## 2018-12-30 DIAGNOSIS — Z820 Family history of epilepsy and other diseases of the nervous system: Secondary | ICD-10-CM | POA: Diagnosis not present

## 2018-12-30 DIAGNOSIS — D473 Essential (hemorrhagic) thrombocythemia: Secondary | ICD-10-CM | POA: Diagnosis not present

## 2018-12-30 DIAGNOSIS — Z825 Family history of asthma and other chronic lower respiratory diseases: Secondary | ICD-10-CM | POA: Insufficient documentation

## 2018-12-30 DIAGNOSIS — R7989 Other specified abnormal findings of blood chemistry: Secondary | ICD-10-CM | POA: Diagnosis not present

## 2018-12-30 DIAGNOSIS — M069 Rheumatoid arthritis, unspecified: Secondary | ICD-10-CM | POA: Insufficient documentation

## 2018-12-30 DIAGNOSIS — Z823 Family history of stroke: Secondary | ICD-10-CM | POA: Insufficient documentation

## 2018-12-30 DIAGNOSIS — Z79899 Other long term (current) drug therapy: Secondary | ICD-10-CM | POA: Insufficient documentation

## 2018-12-30 DIAGNOSIS — K5190 Ulcerative colitis, unspecified, without complications: Secondary | ICD-10-CM | POA: Insufficient documentation

## 2018-12-30 DIAGNOSIS — Z8249 Family history of ischemic heart disease and other diseases of the circulatory system: Secondary | ICD-10-CM | POA: Diagnosis not present

## 2018-12-30 DIAGNOSIS — Z881 Allergy status to other antibiotic agents status: Secondary | ICD-10-CM | POA: Insufficient documentation

## 2018-12-30 DIAGNOSIS — D638 Anemia in other chronic diseases classified elsewhere: Secondary | ICD-10-CM | POA: Insufficient documentation

## 2018-12-30 DIAGNOSIS — D75839 Thrombocytosis, unspecified: Secondary | ICD-10-CM

## 2018-12-30 LAB — CBC WITH DIFFERENTIAL (CANCER CENTER ONLY)
Abs Immature Granulocytes: 0.03 10*3/uL (ref 0.00–0.07)
Basophils Absolute: 0.1 10*3/uL (ref 0.0–0.1)
Basophils Relative: 1 %
Eosinophils Absolute: 0.1 10*3/uL (ref 0.0–0.5)
Eosinophils Relative: 1 %
HCT: 40.3 % (ref 39.0–52.0)
Hemoglobin: 13.3 g/dL (ref 13.0–17.0)
Immature Granulocytes: 0 %
Lymphocytes Relative: 22 %
Lymphs Abs: 1.7 10*3/uL (ref 0.7–4.0)
MCH: 32.5 pg (ref 26.0–34.0)
MCHC: 33 g/dL (ref 30.0–36.0)
MCV: 98.5 fL (ref 80.0–100.0)
Monocytes Absolute: 0.7 10*3/uL (ref 0.1–1.0)
Monocytes Relative: 8 %
Neutro Abs: 5.4 10*3/uL (ref 1.7–7.7)
Neutrophils Relative %: 68 %
Platelet Count: 397 10*3/uL (ref 150–400)
RBC: 4.09 MIL/uL — ABNORMAL LOW (ref 4.22–5.81)
RDW: 15.1 % (ref 11.5–15.5)
WBC Count: 8 10*3/uL (ref 4.0–10.5)
nRBC: 0 % (ref 0.0–0.2)

## 2018-12-30 LAB — SAVE SMEAR(SSMR), FOR PROVIDER SLIDE REVIEW

## 2018-12-30 NOTE — Progress Notes (Signed)
Referral MD  Reason for Referral: Thrombocytosis-likely reactive/iron deficiency from ulcerative colitis  Chief Complaint  Patient presents with  . New Patient (Initial Visit)  : Her platelets have been high.  HPI: Kenneth Payne is well-known to me.  He is a husband of 1 of my patients.  He is a 58 year old white male.  He has been in good health.  He does have a history of ulcerative colitis.  He presented with his back in the 1996.  He has never needed surgery for this.  He had does have ulcerative colitis associated arthritis.  He is on methotrexate for this.  He also takes [place] for the ulcerative colitis.  He has had no problems with flareups for quite a while.  He is noted that he has had thrombocytosis.  Going back to 2018, it was found that his platelet count was 579,000.  His hemoglobin was 10.4.  MCV was 76.  He has had iron replacement before.  Does see Dr. Collene Mares of gastroenterology.  He also sees Dr. Garen Grams of rheumatology.  He does take methotrexate.  He takes his weekly.  The trend for his platelet counts have been relatively stable.  Back in September 2019, his platelet count 336,000.  MCV was 93.  He has had no bleeding.  He has had no obvious change in bowel or bladder habits.  He has had no weight loss or weight gain.  He has had no cough.  He has had no rashes.  On 11/24/2018, his CBC showed a white cell count of 8.1.  Hemoglobin 13.7.  Platelet count 415,000.  His MCV was 94.  I see where the iron studies have been done but I am sure that they have been done by his other physicians.  He has had no headache.  He has had no burning or pain in the hands or feet.  He says he does not chew ice.  Overall, his performance status is ECOG 0.  He does not smoke.  He really does not drink.  He is working quite a bit.   Past Medical History:  Diagnosis Date  . Rheumatoid arthritis ([place])   . Ulcerative colitis ([place])   :  Past Surgical History:  Procedure Laterality  Date  . COLONOSCOPY N/A 08/18/2013   Procedure: COLONOSCOPY;  Surgeon: Winfield Cunas., MD;  Location: [facility];  Service: Endoscopy;  Laterality: N/A;  . CYST REMOVAL NECK    . HERNIA REPAIR     2009  :   Current Outpatient Medications:  .  diclofenac sodium (VOLTAREN) 1 % GEL, Apply 3 gm to 3 large joints up to 3 times a day.Dispense 3 tubes with 3 refills., Disp: 3 Tube, Rfl: 0 .  folic acid (FOLVITE) 1 MG tablet, TAKE 2 TABLETS BY MOUTH DAILY., Disp: 180 tablet, Rfl: 3 .  Methotrexate Sodium (METHOTREXATE, PF,) 50 MG/2ML injection, INJECT 1 ML UNDER THE SKIN WEEKLY, Disp: 12 mL, Rfl: 0 .  Tofacitinib Citrate (XELJANZ) 10 MG TABS, Take by mouth 2 (two) times daily., Disp: , Rfl:  .  TUBERCULIN SYR 1CC/27GX1/2" (B-D TB SYRINGE 1CC/27GX1/2") 27G X 1/2" 1 ML MISC, USE 1 SYRINGE WEEKLY TO INJECT METHOTREXATE, Disp: 12 each, Rfl: 1 .  valsartan-hydrochlorothiazide (DIOVAN-HCT) 80-12.5 MG tablet, Take 1 tablet by mouth daily., Disp: , Rfl: :  :  Allergies  Allergen Reactions  . Augmentin [Amoxicillin-Pot Clavulanate] Diarrhea and Nausea Only    Bloody diarrhea  . Flagyl [Metronidazole] Diarrhea and Nausea Only  Bloody diarrhea  :  Family History  Problem Relation Age of Onset  . Asthma Mother   . Hypertension Mother   . Parkinson's disease Father   . Hypertension Father   . Stroke Father   . Heart attack Maternal Grandmother   . Heart attack Maternal Grandfather   . Stroke Paternal Grandmother   . Heart attack Paternal Grandfather   . Asthma Daughter   :  Social History   Socioeconomic History  . Marital status: Married    Spouse name: Not on file  . Number of children: Not on file  . Years of education: Not on file  . Highest education level: Not on file  Occupational History  . Not on file  Social Needs  . Financial resource strain: Not on file  . Food insecurity    Worry: Not on file    Inability: Not on file  . Transportation needs    Medical: Not on  file    Non-medical: Not on file  Tobacco Use  . Smoking status: Never Smoker  . Smokeless tobacco: Never Used  Substance and Sexual Activity  . Alcohol use: Yes    Comment: social   . Drug use: No  . Sexual activity: Not on file  Lifestyle  . Physical activity    Days per week: Not on file    Minutes per session: Not on file  . Stress: Not on file  Relationships  . Social Herbalist on phone: Not on file    Gets together: Not on file    Attends religious service: Not on file    Active member of club or organization: Not on file    Attends meetings of clubs or organizations: Not on file    Relationship status: Not on file  . Intimate partner violence    Fear of current or ex partner: Not on file    Emotionally abused: Not on file    Physically abused: Not on file    Forced sexual activity: Not on file  Other Topics Concern  . Not on file  Social History Narrative  . Not on file  :  Review of Systems  Constitutional: Negative.   HENT: Negative.   Eyes: Negative.   Respiratory: Negative.   Cardiovascular: Negative.   Gastrointestinal: Negative.   Genitourinary: Negative.   Musculoskeletal: Negative.   Skin: Negative.   Neurological: Negative.   Endo/Heme/Allergies: Negative.   Psychiatric/Behavioral: Negative.      Exam: Well-developed well-nourished white male in no obvious distress.  Vital signs are temperature 97.8.  Pulse 79.  Blood pressure 129/91.  Weight is 187 pounds.  Head exam shows no ocular or oral lesions.  There are no palpable cervical or supraclavicular lymph nodes.  Lungs are clear bilaterally.  Cardiac exam regular rate and rhythm with no murmurs, rubs or bruits.  Abdomen is soft.  He has good bowel sounds.  There is no fluid wave.  There is no palpable liver or spleen tip.  Back exam shows no tenderness over the spine, ribs or hips.  Extremities shows no clubbing, cyanosis or edema.  Skin exam shows no rashes, ecchymoses or petechia.   Neurological exam is non-focal. _0 @   Recent Labs    12/30/18 1126  WBC 8.0  HGB 13.3  HCT 40.3  PLT 397   No results for input(s): NA, K, CL, CO2, GLUCOSE, BUN, CREATININE, CALCIUM in the last 72 hours.  Blood smear review: Mild nasal cytosis  and poikilocytosis.  He does have a good population of microcytic red blood cells.  I do not see any nucleated red blood cells.  There are no teardrop cells.  I see no rouleaux formation.  He has no schistocytes or spherocytes.  White cells were normal morphology and maturation.  I see no hypersegmented polys.  There are no immature myeloid lymphoid forms.  Platelets are adequate number and size.  Platelets are well granulated.  There may be a couple large platelets.  Pathology: None    Assessment and Plan: Mr. Wyles is a very nice 58 year old white male.  He has minimal thrombocytosis.  I have to believe that this is all reactive.  I really do not see anything that looks like a myeloproliferative disorder.  I suspect that he probably has some iron deficiency.  Even though the MCV is normal, I think this is offset by the methotrexate.  He clearly had iron deficiency a couple years ago.  He does have some oral iron that he has taken in the past.  The thrombocytosis also could be reactive because ulcerative colitis is an inflammatory disease and this does lead to some platelet elevation.  I do not see any indication for bone marrow biopsy.  I really do not think that this thrombocytosis is going to be clinically significant.  I do not see where he will have problems with blood clots or with bleeding.  I spent about 45 minutes with Kenneth Payne.  It was nice to see him.  I typically see him with his wife who we take care of.  I just do not think we have to have him come back to the office.  I just do not see how we are adding to his medical care.  If his iron studies are low, then we will have him take his oral iron that was given to him by  his gastroenterologist.

## 2018-12-31 ENCOUNTER — Telehealth: Payer: Self-pay | Admitting: *Deleted

## 2018-12-31 LAB — IRON AND TIBC
Iron: 129 ug/dL (ref 42–163)
Saturation Ratios: 31 % (ref 20–55)
TIBC: 421 ug/dL — ABNORMAL HIGH (ref 202–409)
UIBC: 292 ug/dL (ref 117–376)

## 2018-12-31 LAB — VON WILLEBRAND PANEL
Coagulation Factor VIII: 116 % (ref 56–140)
Ristocetin Co-factor, Plasma: 62 % (ref 50–200)
Von Willebrand Antigen, Plasma: 88 % (ref 50–200)

## 2018-12-31 LAB — COAG STUDIES INTERP REPORT

## 2018-12-31 LAB — FERRITIN: Ferritin: 19 ng/mL — ABNORMAL LOW (ref 24–336)

## 2018-12-31 NOTE — Telephone Encounter (Signed)
Message left to inform patient per order of Dr. Marin Olp that "the iron is low and to take the iron supplement that he already has." Pt instructed to call office back with any questions or concerns.

## 2018-12-31 NOTE — Telephone Encounter (Signed)
-----   Message from [name], MD sent at 12/31/2018 12:16 PM EDT ----- Call - the iron is low!!  Tell him to take the iron supplement that he has already has!!  Pete

## 2019-01-02 ENCOUNTER — Telehealth: Payer: Self-pay | Admitting: *Deleted

## 2019-01-02 NOTE — Telephone Encounter (Signed)
Call received from patient wanting to know how long he should continue iron supplement.  Patient notified per order of Dr. Marin Olp to come back in two months for labs and to see Dr. Marin Olp.  Message sent to scheduling.  Patient appreciative of assistance.

## 2019-01-09 ENCOUNTER — Other Ambulatory Visit: Payer: Self-pay

## 2019-01-09 ENCOUNTER — Encounter: Payer: Self-pay | Admitting: Rheumatology

## 2019-01-09 ENCOUNTER — Ambulatory Visit: Payer: PRIVATE HEALTH INSURANCE | Admitting: Rheumatology

## 2019-01-09 VITALS — BP 120/72 | HR 86 | Resp 14 | Ht 67.0 in | Wt 190.2 lb

## 2019-01-09 DIAGNOSIS — Z79899 Other long term (current) drug therapy: Secondary | ICD-10-CM

## 2019-01-09 DIAGNOSIS — M1990 Unspecified osteoarthritis, unspecified site: Secondary | ICD-10-CM | POA: Diagnosis not present

## 2019-01-09 DIAGNOSIS — M25561 Pain in right knee: Secondary | ICD-10-CM | POA: Diagnosis not present

## 2019-01-09 DIAGNOSIS — E611 Iron deficiency: Secondary | ICD-10-CM

## 2019-01-09 DIAGNOSIS — G8929 Other chronic pain: Secondary | ICD-10-CM

## 2019-01-09 DIAGNOSIS — K51818 Other ulcerative colitis with other complication: Secondary | ICD-10-CM

## 2019-01-09 DIAGNOSIS — M5030 Other cervical disc degeneration, unspecified cervical region: Secondary | ICD-10-CM

## 2019-01-09 DIAGNOSIS — M65331 Trigger finger, right middle finger: Secondary | ICD-10-CM

## 2019-01-09 NOTE — Patient Instructions (Signed)
Standing Labs We placed an order today for your standing lab work.    Please come back and get your standing labs in October and every three months  We have open lab daily Monday through Thursday from 8:30-12:30 PM and 1:30-4:30 PM and Friday from 8:30-12:30 PM and 1:30 -4:00 PM at the office of Dr. Bo Merino.   You may experience shorter wait times on Monday and Friday afternoons. The office is located at [address] [place], [place], [place] [address] No appointment is necessary.   Labs are drawn by [facility].  You may receive a bill from [facility] for your lab work.  If you wish to have your labs drawn at another location, please call the office 24 hours in advance to send orders.  If you have any questions regarding directions or hours of operation,  please call [phone].   Just as a reminder please drink plenty of water prior to coming for your lab work. Thanks!

## 2019-01-15 ENCOUNTER — Telehealth: Payer: Self-pay | Admitting: *Deleted

## 2019-01-15 ENCOUNTER — Other Ambulatory Visit: Payer: Self-pay | Admitting: Hematology & Oncology

## 2019-01-15 DIAGNOSIS — D473 Essential (hemorrhagic) thrombocythemia: Secondary | ICD-10-CM

## 2019-01-15 DIAGNOSIS — D75839 Thrombocytosis, unspecified: Secondary | ICD-10-CM

## 2019-01-15 NOTE — Progress Notes (Signed)
venous

## 2019-01-15 NOTE — Telephone Encounter (Signed)
Call placed back to patient and patient notified that Dr. Marin Olp has entered an order for an [place] of his left leg.  [facility] given to patient to contact to make appt.for [place].  Pt states that he will call now to make appt.and is appreciative of call back.

## 2019-01-15 NOTE — Telephone Encounter (Signed)
Call received from patient stating that he had a "catch" along with a "bad cramp" in his left thigh last night and that his left thigh remains "sore" with a "sensation" to it.  He states that there is no redness, heat or swelling to that leg.  Dr. Marin Olp notified and order placed per Dr. Marin Olp for pt to have an US of the left leg.

## 2019-01-16 ENCOUNTER — Other Ambulatory Visit: Payer: Self-pay

## 2019-01-16 ENCOUNTER — Ambulatory Visit (HOSPITAL_BASED_OUTPATIENT_CLINIC_OR_DEPARTMENT_OTHER)
Admission: RE | Admit: 2019-01-16 | Discharge: 2019-01-16 | Disposition: A | Discharge status: Home / Self Care | Payer: PRIVATE HEALTH INSURANCE | Source: Ambulatory Visit | Attending: Hematology & Oncology | Admitting: Hematology & Oncology

## 2019-01-16 DIAGNOSIS — D75839 Thrombocytosis, unspecified: Secondary | ICD-10-CM

## 2019-01-16 DIAGNOSIS — D473 Essential (hemorrhagic) thrombocythemia: Secondary | ICD-10-CM | POA: Diagnosis present

## 2019-01-19 ENCOUNTER — Telehealth: Payer: Self-pay | Admitting: *Deleted

## 2019-01-19 NOTE — Telephone Encounter (Signed)
-----   Message from [name], MD sent at 01/17/2019  7:50 AM EDT ----- Call - there is no blood clot in the leg!!!  pete

## 2019-01-19 NOTE — Telephone Encounter (Signed)
As noted below by Dr. Marin Olp, I informed the patient that there is NO blood clot in the leg. He verbalized understanding.

## 2019-03-03 ENCOUNTER — Other Ambulatory Visit: Payer: Self-pay | Admitting: Rheumatology

## 2019-03-03 NOTE — Telephone Encounter (Signed)
Last Visit: 01/09/19 Next Visit: 06/12/19 Labs:  11/24/18 CMP WNL. Plts are borderline elevated-415.  Patient advised he is due to update labs. Patient will update 03/04/19.   Okay to refill 30 day supply per Dr. Estanislado Pandy

## 2019-03-04 ENCOUNTER — Other Ambulatory Visit: Payer: Self-pay

## 2019-03-04 DIAGNOSIS — M1990 Unspecified osteoarthritis, unspecified site: Secondary | ICD-10-CM

## 2019-03-05 LAB — CBC WITH DIFFERENTIAL/PLATELET
Absolute Monocytes: 880 cells/uL (ref 200–950)
Basophils Absolute: 53 cells/uL (ref 0–200)
Basophils Relative: 0.6 %
Eosinophils Absolute: 141 cells/uL (ref 15–500)
Eosinophils Relative: 1.6 %
HCT: 38 % — ABNORMAL LOW (ref 38.5–50.0)
Hemoglobin: 12.7 g/dL — ABNORMAL LOW (ref 13.2–17.1)
Lymphs Abs: 1795 cells/uL (ref 850–3900)
MCH: 32.7 pg (ref 27.0–33.0)
MCHC: 33.4 g/dL (ref 32.0–36.0)
MCV: 97.9 fL (ref 80.0–100.0)
MPV: 10.8 fL (ref 7.5–12.5)
Monocytes Relative: 10 %
Neutro Abs: 5931 cells/uL (ref 1500–7800)
Neutrophils Relative %: 67.4 %
Platelets: 379 10*3/uL (ref 140–400)
RBC: 3.88 10*6/uL — ABNORMAL LOW (ref 4.20–5.80)
RDW: 15.2 % — ABNORMAL HIGH (ref 11.0–15.0)
Total Lymphocyte: 20.4 %
WBC: 8.8 10*3/uL (ref 3.8–10.8)

## 2019-03-05 LAB — COMPLETE METABOLIC PANEL WITH GFR
AG Ratio: 1.9 (calc) (ref 1.0–2.5)
ALT: 39 U/L (ref 9–46)
AST: 40 U/L — ABNORMAL HIGH (ref 10–35)
Albumin: 4.3 g/dL (ref 3.6–5.1)
Alkaline phosphatase (APISO): 54 U/L (ref 35–144)
BUN: 16 mg/dL (ref 7–25)
CO2: 27 mmol/L (ref 20–32)
Calcium: 9.7 mg/dL (ref 8.6–10.3)
Chloride: 104 mmol/L (ref 98–110)
Creat: 1.28 mg/dL (ref 0.70–1.33)
GFR, Est African American: 72 mL/min/{1.73_m2} (ref >=60)
GFR, Est Non African American: 62 mL/min/{1.73_m2} (ref >=60)
Globulin: 2.3 g/dL (calc) (ref 1.9–3.7)
Glucose, Bld: 85 mg/dL (ref 65–99)
Potassium: 3.9 mmol/L (ref 3.5–5.3)
Sodium: 141 mmol/L (ref 135–146)
Total Bilirubin: 0.3 mg/dL (ref 0.2–1.2)
Total Protein: 6.6 g/dL (ref 6.1–8.1)

## 2019-03-05 NOTE — Progress Notes (Signed)
LFTs mildly elevated. Avoid all NSAIDS, and alcohol.

## 2019-03-06 ENCOUNTER — Other Ambulatory Visit: Payer: Self-pay | Admitting: Rheumatology

## 2019-03-06 NOTE — Telephone Encounter (Signed)
Last Visit: 01/09/19 Next Visit: 06/12/19  Okay to refill per Dr. Estanislado Pandy

## 2019-03-11 ENCOUNTER — Inpatient Hospital Stay: Payer: PRIVATE HEALTH INSURANCE | Attending: Hematology & Oncology | Admitting: Hematology & Oncology

## 2019-03-11 ENCOUNTER — Encounter: Payer: Self-pay | Admitting: Hematology & Oncology

## 2019-03-11 ENCOUNTER — Inpatient Hospital Stay: Payer: PRIVATE HEALTH INSURANCE

## 2019-03-11 ENCOUNTER — Other Ambulatory Visit: Payer: Self-pay

## 2019-03-11 VITALS — BP 118/75 | HR 91 | Temp 97.9°F | Resp 18 | Wt 185.0 lb

## 2019-03-11 DIAGNOSIS — D473 Essential (hemorrhagic) thrombocythemia: Secondary | ICD-10-CM

## 2019-03-11 DIAGNOSIS — Z79899 Other long term (current) drug therapy: Secondary | ICD-10-CM | POA: Diagnosis not present

## 2019-03-11 DIAGNOSIS — M069 Rheumatoid arthritis, unspecified: Secondary | ICD-10-CM | POA: Diagnosis not present

## 2019-03-11 DIAGNOSIS — D75839 Thrombocytosis, unspecified: Secondary | ICD-10-CM

## 2019-03-11 DIAGNOSIS — F419 Anxiety disorder, unspecified: Secondary | ICD-10-CM | POA: Diagnosis not present

## 2019-03-11 DIAGNOSIS — K5190 Ulcerative colitis, unspecified, without complications: Secondary | ICD-10-CM | POA: Insufficient documentation

## 2019-03-11 DIAGNOSIS — R7989 Other specified abnormal findings of blood chemistry: Secondary | ICD-10-CM | POA: Insufficient documentation

## 2019-03-11 DIAGNOSIS — Z791 Long term (current) use of non-steroidal anti-inflammatories (NSAID): Secondary | ICD-10-CM | POA: Diagnosis not present

## 2019-03-11 LAB — CBC WITH DIFFERENTIAL (CANCER CENTER ONLY)
Abs Immature Granulocytes: 0.06 10*3/uL (ref 0.00–0.07)
Basophils Absolute: 0.1 10*3/uL (ref 0.0–0.1)
Basophils Relative: 1 %
Eosinophils Absolute: 0.1 10*3/uL (ref 0.0–0.5)
Eosinophils Relative: 1 %
HCT: 41.8 % (ref 39.0–52.0)
Hemoglobin: 13.8 g/dL (ref 13.0–17.0)
Immature Granulocytes: 1 %
Lymphocytes Relative: 16 %
Lymphs Abs: 1.6 10*3/uL (ref 0.7–4.0)
MCH: 32.8 pg (ref 26.0–34.0)
MCHC: 33 g/dL (ref 30.0–36.0)
MCV: 99.3 fL (ref 80.0–100.0)
Monocytes Absolute: 0.7 10*3/uL (ref 0.1–1.0)
Monocytes Relative: 8 %
Neutro Abs: 7.1 10*3/uL (ref 1.7–7.7)
Neutrophils Relative %: 73 %
Platelet Count: 459 10*3/uL — ABNORMAL HIGH (ref 150–400)
RBC: 4.21 MIL/uL — ABNORMAL LOW (ref 4.22–5.81)
RDW: 15 % (ref 11.5–15.5)
WBC Count: 9.6 10*3/uL (ref 4.0–10.5)
nRBC: 0 % (ref 0.0–0.2)

## 2019-03-11 LAB — SAVE SMEAR(SSMR), FOR PROVIDER SLIDE REVIEW

## 2019-03-12 LAB — IRON AND TIBC
Iron: 80 ug/dL (ref 42–163)
Saturation Ratios: 21 % (ref 20–55)
TIBC: 384 ug/dL (ref 202–409)
UIBC: 304 ug/dL (ref 117–376)

## 2019-03-12 LAB — FERRITIN: Ferritin: 36 ng/mL (ref 24–336)

## 2019-03-12 NOTE — Progress Notes (Signed)
Hematology and Oncology Follow Up Visit  Kenneth Payne [id number] [birth year] 58 y.o. 03/12/2019   Principle Diagnosis:   Thrombocytosis -- reactive vs Iron deficiency  Ulcerative colitis  Rheumatoid Arthritis  Current Therapy:    Observation     Interim History:  Kenneth Payne is back for second office visit.  We first saw him back in July.  At that time, he had minimal thrombocytosis.  He does have ulcerative colitis.  He does have rheumatoid arthritis.  We checked iron studies on him.  His ferritin was 19 with an iron saturation of 31%.  We also checked von Willebrand levels on him.  These were okay.  He has had no real problems since we saw him.  He does get tired fairly easily.  I told him that high platelets would not generally do this.  He has had no obvious melena or bright red blood per rectum.  There is been no nausea or vomiting.  He has had no rashes.  He has had no leg swelling.  I am not totally rule out the possibility of an underlying myeloproliferative issue.  However, I think this would be unlikely.  He has had no headache.  He has had no visual changes.  Overall, I was his performance status is ECOG 0.  Medications:  Current Outpatient Medications:  .  diclofenac sodium (VOLTAREN) 1 % GEL, Apply 3 gm to 3 large joints up to 3 times a day.Dispense 3 tubes with 3 refills. (Patient taking differently: as needed. Apply 3 gm to 3 large joints up to 3 times a day.Dispense 3 tubes with 3 refills.), Disp: 3 Tube, Rfl: 0 .  folic acid (FOLVITE) 1 MG tablet, TAKE 2 TABLETS BY MOUTH DAILY., Disp: 180 tablet, Rfl: 3 .  Methotrexate Sodium (METHOTREXATE, PF,) 50 MG/2ML injection, INJECT 1 ML UNDER THE SKIN WEEKLY, Disp: 4 mL, Rfl: 0 .  Tofacitinib Citrate (XELJANZ) 10 MG TABS, Take by mouth 2 (two) times daily., Disp: , Rfl:  .  TUBERCULIN SYR 1CC/27GX1/2" (B-D TB SYRINGE 1CC/27GX1/2") 27G X 1/2" 1 ML MISC, USE 1 SYRINGE WEEKLY TO INJECT METHOTREXATE, Disp: 12 each, Rfl: 1  .  valsartan-hydrochlorothiazide (DIOVAN-HCT) 80-12.5 MG tablet, Take 1 tablet by mouth daily., Disp: , Rfl:   Allergies:  Allergies  Allergen Reactions  . Augmentin [Amoxicillin-Pot Clavulanate] Diarrhea and Nausea Only    Bloody diarrhea  . Flagyl [Metronidazole] Diarrhea and Nausea Only    Bloody diarrhea    Past Medical History, Surgical history, Social history, and Family History were reviewed and updated.  Review of Systems: Review of Systems  Constitutional: Negative.   HENT:  Negative.   Eyes: Negative.   Respiratory: Negative.   Cardiovascular: Negative.   Gastrointestinal: Negative.   Endocrine: Negative.   Genitourinary: Negative.    Musculoskeletal: Negative.   Skin: Negative.   Neurological: Negative.   Hematological: Negative.   Psychiatric/Behavioral: Negative.     Physical Exam:  weight is 185 lb (83.9 kg). His oral temperature is 97.9 F (36.6 C). His blood pressure is 118/75 and his pulse is 91. His respiration is 18 and oxygen saturation is 98%.   Wt Readings from Last 3 Encounters:  03/11/19 185 lb (83.9 kg)  01/09/19 190 lb 3.2 oz (86.3 kg)  12/30/18 187 lb (84.8 kg)    Physical Exam Vitals signs reviewed.  HENT:     Head: Normocephalic and atraumatic.  Eyes:     Pupils: Pupils are equal, round, and reactive to light.  Neck:     Musculoskeletal: Normal range of motion.  Cardiovascular:     Rate and Rhythm: Normal rate and regular rhythm.     Heart sounds: Normal heart sounds.  Pulmonary:     Effort: Pulmonary effort is normal.     Breath sounds: Normal breath sounds.  Abdominal:     General: Bowel sounds are normal.     Palpations: Abdomen is soft.  Musculoskeletal: Normal range of motion.        General: No tenderness or deformity.  Lymphadenopathy:     Cervical: No cervical adenopathy.  Skin:    General: Skin is warm and dry.     Findings: No erythema or rash.  Neurological:     Mental Status: He is alert and oriented to person,  place, and time.  Psychiatric:        Behavior: Behavior normal.        Thought Content: Thought content normal.        Judgment: Judgment normal.      Lab Results  Component Value Date   WBC 9.6 03/11/2019   HGB 13.8 03/11/2019   HCT 41.8 03/11/2019   MCV 99.3 03/11/2019   PLT 459 (H) 03/11/2019     Chemistry      Component Value Date/Time   NA 141 03/04/2019 1519   K 3.9 03/04/2019 1519   CL 104 03/04/2019 1519   CO2 27 03/04/2019 1519   BUN 16 03/04/2019 1519   CREATININE 1.28 03/04/2019 1519      Component Value Date/Time   CALCIUM 9.7 03/04/2019 1519   ALKPHOS 68 02/13/2017 1616   AST 40 (H) 03/04/2019 1519   ALT 39 03/04/2019 1519   BILITOT 0.3 03/04/2019 1519       Impression and Plan: Kenneth Payne is a 58 year old white male.  He has minimal thrombocytosis.  We will see what his iron studies show.  He is taking some oral iron.  His iron studies are still low, that we will give him some IV iron and see if that does not help his platelet count come down a little bit.  I looked at his blood smear.  I was not all that impressed.  He had a couple large platelets.  I saw no immature myeloid or lymphoid white blood cells.  He had no nucleated red blood cells.  Again, I have not totally discount the possibility of a myeloproliferative problem.  I would probably send off molecular studies before I would consider a bone marrow biopsy on him.  Given that he has inflammatory conditions, the thrombocytosis could easily be reactive.  At his current level of platelet count, I just do not think he would have any kind of symptoms.  I spent about 35 minutes with he and his wife.  I also see his wife.  She has platelet problems but on the total opposite spectrum.  Again, we will have to see what his iron studies show and then we will plan for follow-up.  This is a little complicated.  Again we do not have a clear etiology for the thrombocytosis.  This does cause some anxiety  with Kenneth Payne.  I understand this.   [name], MD 9/17/20207:07 AM

## 2019-03-13 ENCOUNTER — Telehealth: Payer: Self-pay | Admitting: *Deleted

## 2019-03-13 NOTE — Telephone Encounter (Signed)
Call received from patient requesting lab results from 03/11/19.  Lab results reviewed with patient.  Patient would like to know what next steps are.  Pt notified per order of Dr. Marin Olp that Dr. Marin Olp would like for him to come in next week for further lab work.  Patient appreciative of information and has no further questions or concerns at this time.  Message sent to scheduling.

## 2019-03-13 NOTE — Addendum Note (Signed)
Addended by: Burney Gauze R on: 03/13/2019 05:04 PM   Modules accepted: Orders

## 2019-03-16 ENCOUNTER — Inpatient Hospital Stay: Payer: PRIVATE HEALTH INSURANCE

## 2019-03-16 ENCOUNTER — Other Ambulatory Visit: Payer: Self-pay

## 2019-03-16 DIAGNOSIS — D75839 Thrombocytosis, unspecified: Secondary | ICD-10-CM

## 2019-03-16 DIAGNOSIS — D473 Essential (hemorrhagic) thrombocythemia: Secondary | ICD-10-CM

## 2019-03-16 LAB — SAVE SMEAR(SSMR), FOR PROVIDER SLIDE REVIEW

## 2019-03-16 LAB — CBC WITH DIFFERENTIAL (CANCER CENTER ONLY)
Abs Immature Granulocytes: 0.03 10*3/uL (ref 0.00–0.07)
Basophils Absolute: 0 10*3/uL (ref 0.0–0.1)
Basophils Relative: 1 %
Eosinophils Absolute: 0.1 10*3/uL (ref 0.0–0.5)
Eosinophils Relative: 1 %
HCT: 40.1 % (ref 39.0–52.0)
Hemoglobin: 13.4 g/dL (ref 13.0–17.0)
Immature Granulocytes: 0 %
Lymphocytes Relative: 14 %
Lymphs Abs: 1.2 10*3/uL (ref 0.7–4.0)
MCH: 32.8 pg (ref 26.0–34.0)
MCHC: 33.4 g/dL (ref 30.0–36.0)
MCV: 98.3 fL (ref 80.0–100.0)
Monocytes Absolute: 0.7 10*3/uL (ref 0.1–1.0)
Monocytes Relative: 9 %
Neutro Abs: 6.4 10*3/uL (ref 1.7–7.7)
Neutrophils Relative %: 75 %
Platelet Count: 407 10*3/uL — ABNORMAL HIGH (ref 150–400)
RBC: 4.08 MIL/uL — ABNORMAL LOW (ref 4.22–5.81)
RDW: 14.8 % (ref 11.5–15.5)
WBC Count: 8.5 10*3/uL (ref 4.0–10.5)
nRBC: 0 % (ref 0.0–0.2)

## 2019-03-27 ENCOUNTER — Telehealth: Payer: Self-pay | Admitting: *Deleted

## 2019-03-27 NOTE — Telephone Encounter (Signed)
Call received from patient to obtain lab results.  Dr. Marin Olp notified and reviewed pt.'s lab results.  Call placed back to patient and message left to notify pt per order of Dr. Marin Olp that all lab work is negative and that there is no need for a bone marrow biopsy and that Dr. Marin Olp would like for him to return for labs and office visit in one month.  Instructed pt to call office back with any questions or concerns.  Message sent to scheduling.

## 2019-03-30 ENCOUNTER — Telehealth: Payer: Self-pay | Admitting: Hematology & Oncology

## 2019-03-30 NOTE — Telephone Encounter (Signed)
lmom to inform patient of 11/5 appt at 0800 per 10/2 sch msg

## 2019-03-31 LAB — JAK2 (INCLUDING V617F AND EXON 12), MPL,& CALR-NEXT GEN SEQ

## 2019-04-13 ENCOUNTER — Other Ambulatory Visit: Payer: Self-pay | Admitting: Rheumatology

## 2019-04-13 NOTE — Telephone Encounter (Signed)
Last Visit: 01/09/19 Next Visit: 06/12/19 Labs: 03/04/19 LFTs mildly elevated.  Okay to refill per Dr. Estanislado Pandy

## 2019-04-30 ENCOUNTER — Other Ambulatory Visit: Payer: PRIVATE HEALTH INSURANCE

## 2019-04-30 ENCOUNTER — Ambulatory Visit: Payer: PRIVATE HEALTH INSURANCE | Admitting: Hematology & Oncology

## 2019-05-15 ENCOUNTER — Inpatient Hospital Stay: Payer: PRIVATE HEALTH INSURANCE | Admitting: Hematology & Oncology

## 2019-05-15 ENCOUNTER — Inpatient Hospital Stay: Payer: PRIVATE HEALTH INSURANCE

## 2019-06-08 ENCOUNTER — Encounter: Payer: Self-pay | Admitting: Hematology & Oncology

## 2019-06-08 ENCOUNTER — Other Ambulatory Visit: Payer: Self-pay

## 2019-06-08 ENCOUNTER — Inpatient Hospital Stay: Payer: PRIVATE HEALTH INSURANCE | Attending: Hematology & Oncology

## 2019-06-08 ENCOUNTER — Inpatient Hospital Stay (HOSPITAL_BASED_OUTPATIENT_CLINIC_OR_DEPARTMENT_OTHER): Payer: PRIVATE HEALTH INSURANCE | Admitting: Hematology & Oncology

## 2019-06-08 VITALS — BP 124/88 | HR 83 | Temp 97.3°F | Resp 18 | Wt 183.5 lb

## 2019-06-08 DIAGNOSIS — K5190 Ulcerative colitis, unspecified, without complications: Secondary | ICD-10-CM | POA: Diagnosis present

## 2019-06-08 DIAGNOSIS — D473 Essential (hemorrhagic) thrombocythemia: Secondary | ICD-10-CM | POA: Insufficient documentation

## 2019-06-08 DIAGNOSIS — M069 Rheumatoid arthritis, unspecified: Secondary | ICD-10-CM | POA: Diagnosis not present

## 2019-06-08 DIAGNOSIS — M1990 Unspecified osteoarthritis, unspecified site: Secondary | ICD-10-CM

## 2019-06-08 DIAGNOSIS — Z79899 Other long term (current) drug therapy: Secondary | ICD-10-CM | POA: Diagnosis not present

## 2019-06-08 DIAGNOSIS — D75839 Thrombocytosis, unspecified: Secondary | ICD-10-CM

## 2019-06-08 DIAGNOSIS — R7989 Other specified abnormal findings of blood chemistry: Secondary | ICD-10-CM | POA: Diagnosis not present

## 2019-06-08 DIAGNOSIS — Z791 Long term (current) use of non-steroidal anti-inflammatories (NSAID): Secondary | ICD-10-CM | POA: Insufficient documentation

## 2019-06-08 LAB — CMP (CANCER CENTER ONLY)
ALT: 23 U/L (ref 0–44)
AST: 19 U/L (ref 15–41)
Albumin: 4.9 g/dL (ref 3.5–5.0)
Alkaline Phosphatase: 67 U/L (ref 38–126)
Anion gap: 9 (ref 5–15)
BUN: 17 mg/dL (ref 6–20)
CO2: 29 mmol/L (ref 22–32)
Calcium: 9.4 mg/dL (ref 8.9–10.3)
Chloride: 102 mmol/L (ref 98–111)
Creatinine: 1.16 mg/dL (ref 0.61–1.24)
GFR, Est AFR Am: >60 mL/min (ref >60)
GFR, Estimated: >60 mL/min (ref >60)
Glucose, Bld: 100 mg/dL — ABNORMAL HIGH (ref 70–99)
Potassium: 3.8 mmol/L (ref 3.5–5.1)
Sodium: 140 mmol/L (ref 135–145)
Total Bilirubin: 0.4 mg/dL (ref 0.3–1.2)
Total Protein: 7.5 g/dL (ref 6.5–8.1)

## 2019-06-08 LAB — CBC WITH DIFFERENTIAL (CANCER CENTER ONLY)
Abs Immature Granulocytes: 0.06 10*3/uL (ref 0.00–0.07)
Basophils Absolute: 0 10*3/uL (ref 0.0–0.1)
Basophils Relative: 1 %
Eosinophils Absolute: 0.1 10*3/uL (ref 0.0–0.5)
Eosinophils Relative: 1 %
HCT: 41.2 % (ref 39.0–52.0)
Hemoglobin: 13.8 g/dL (ref 13.0–17.0)
Immature Granulocytes: 1 %
Lymphocytes Relative: 16 %
Lymphs Abs: 1.4 10*3/uL (ref 0.7–4.0)
MCH: 32.9 pg (ref 26.0–34.0)
MCHC: 33.5 g/dL (ref 30.0–36.0)
MCV: 98.3 fL (ref 80.0–100.0)
Monocytes Absolute: 0.7 10*3/uL (ref 0.1–1.0)
Monocytes Relative: 9 %
Neutro Abs: 6.3 10*3/uL (ref 1.7–7.7)
Neutrophils Relative %: 72 %
Platelet Count: 380 10*3/uL (ref 150–400)
RBC: 4.19 MIL/uL — ABNORMAL LOW (ref 4.22–5.81)
RDW: 15 % (ref 11.5–15.5)
WBC Count: 8.6 10*3/uL (ref 4.0–10.5)
nRBC: 0 % (ref 0.0–0.2)

## 2019-06-08 LAB — SAVE SMEAR(SSMR), FOR PROVIDER SLIDE REVIEW

## 2019-06-08 NOTE — Progress Notes (Signed)
Hematology and Oncology Follow Up Visit  Kenneth Payne [id number] 09-09-1960 58 y.o. 06/08/2019   Principle Diagnosis:   Thrombocytosis -- reactive vs Iron deficiency  Ulcerative colitis  Rheumatoid Arthritis  Current Therapy:    Observation     Interim History:  Kenneth Payne is back for follow-up.  He is doing quite good.  He is doing quite well with his ulcerative colitis and rheumatoid arthritis.  He is on medications for both.  When we last saw him, we did do molecular analysis for the thrombocytosis.  Thankfully, his molecular analysis came back NEGATIVE for JAK2/Calreticulin/MPL515.  He is taking oral iron.  He is tolerating this pretty well.  He and his wife now have been in the new house for about 3 months.  They are really enjoying it.  He is still working.  He is not traveling as much secondary to the coronavirus.  He has had no problems with bleeding.  There is no bruising.  He has had no pain in his hands or feet.  His last iron studies showed a ferritin of 36 with an iron saturation of 21%.  I told him that if he wanted to cut back on the iron a little bit he certainly could.  Overall, I would say his performance status is ECOG 0.    Medications:  Current Outpatient Medications:  .  diclofenac sodium (VOLTAREN) 1 % GEL, Apply 3 gm to 3 large joints up to 3 times a day.Dispense 3 tubes with 3 refills. (Patient taking differently: as needed. Apply 3 gm to 3 large joints up to 3 times a day.Dispense 3 tubes with 3 refills.), Disp: 3 Tube, Rfl: 0 .  folic acid (FOLVITE) 1 MG tablet, TAKE 2 TABLETS BY MOUTH DAILY., Disp: 180 tablet, Rfl: 3 .  Methotrexate Sodium (METHOTREXATE, PF,) 50 MG/2ML injection, INJECT 1 ML UNDER THE SKIN WEEKLY, Disp: 12 mL, Rfl: 0 .  Tofacitinib Citrate (XELJANZ) 10 MG TABS, Take by mouth 2 (two) times daily., Disp: , Rfl:  .  TUBERCULIN SYR 1CC/27GX1/2" (B-D TB SYRINGE 1CC/27GX1/2") 27G X 1/2" 1 ML MISC, USE 1 SYRINGE WEEKLY TO INJECT  METHOTREXATE, Disp: 12 each, Rfl: 1 .  valsartan-hydrochlorothiazide (DIOVAN-HCT) 80-12.5 MG tablet, Take 1 tablet by mouth daily., Disp: , Rfl:   Allergies:  Allergies  Allergen Reactions  . Augmentin [Amoxicillin-Pot Clavulanate] Diarrhea and Nausea Only    Bloody diarrhea  . Flagyl [Metronidazole] Diarrhea and Nausea Only    Bloody diarrhea    Past Medical History, Surgical history, Social history, and Family History were reviewed and updated.  Review of Systems: Review of Systems  Constitutional: Negative.   HENT:  Negative.   Eyes: Negative.   Respiratory: Negative.   Cardiovascular: Negative.   Gastrointestinal: Negative.   Endocrine: Negative.   Genitourinary: Negative.    Musculoskeletal: Negative.   Skin: Negative.   Neurological: Negative.   Hematological: Negative.   Psychiatric/Behavioral: Negative.     Physical Exam:  weight is 183 lb 8 oz (83.2 kg). His temporal temperature is 97.3 F (36.3 C) (abnormal). His blood pressure is 124/88 and his pulse is 83. His respiration is 18 and oxygen saturation is 98%.   Wt Readings from Last 3 Encounters:  06/08/19 183 lb 8 oz (83.2 kg)  03/11/19 185 lb (83.9 kg)  01/09/19 190 lb 3.2 oz (86.3 kg)    Physical Exam Vitals reviewed.  HENT:     Head: Normocephalic and atraumatic.  Eyes:     Pupils:  Pupils are equal, round, and reactive to light.  Cardiovascular:     Rate and Rhythm: Normal rate and regular rhythm.     Heart sounds: Normal heart sounds.  Pulmonary:     Effort: Pulmonary effort is normal.     Breath sounds: Normal breath sounds.  Abdominal:     General: Bowel sounds are normal.     Palpations: Abdomen is soft.  Musculoskeletal:        General: No tenderness or deformity. Normal range of motion.     Cervical back: Normal range of motion.  Lymphadenopathy:     Cervical: No cervical adenopathy.  Skin:    General: Skin is warm and dry.     Findings: No erythema or rash.  Neurological:      Mental Status: He is alert and oriented to person, place, and time.  Psychiatric:        Behavior: Behavior normal.        Thought Content: Thought content normal.        Judgment: Judgment normal.      Lab Results  Component Value Date   WBC 8.6 06/08/2019   HGB 13.8 06/08/2019   HCT 41.2 06/08/2019   MCV 98.3 06/08/2019   PLT 380 06/08/2019     Chemistry      Component Value Date/Time   NA 140 06/08/2019 1336   K 3.8 06/08/2019 1336   CL 102 06/08/2019 1336   CO2 29 06/08/2019 1336   BUN 17 06/08/2019 1336   CREATININE 1.16 06/08/2019 1336   CREATININE 1.28 03/04/2019 1519      Component Value Date/Time   CALCIUM 9.4 06/08/2019 1336   ALKPHOS 67 06/08/2019 1336   AST 19 06/08/2019 1336   ALT 23 06/08/2019 1336   BILITOT 0.4 06/08/2019 1336       Impression and Plan: Kenneth Payne is a 58 year old white male.  He has minimal thrombocytosis.  His platelet count keeps improving.  I have to believe that this is because the iron that he is taking.  I am sure that he is probably losing some iron and blood with respect to the ulcerative colitis.  Also been on methotrexate probably is causing some changes.  At this point, I do still think we have to get him back on a regular basis.  We can have him come back if he feels he needs to.  He certainly is on top of things and knows when there might be a problem.  I am just happy that everything is going well for him and his wife.  It sounds like they will have a wonderful Christmas by having primary rib which hopefully will be cooked very rare.    [name], MD 12/14/20202:29 PM

## 2019-06-09 ENCOUNTER — Telehealth: Payer: Self-pay | Admitting: Hematology & Oncology

## 2019-06-09 LAB — IRON AND TIBC
Iron: 181 ug/dL — ABNORMAL HIGH (ref 42–163)
Saturation Ratios: 46 % (ref 20–55)
TIBC: 396 ug/dL (ref 202–409)
UIBC: 215 ug/dL (ref 117–376)

## 2019-06-09 LAB — LACTATE DEHYDROGENASE: LDH: 238 U/L — ABNORMAL HIGH (ref 98–192)

## 2019-06-09 LAB — FERRITIN: Ferritin: 29 ng/mL (ref 24–336)

## 2019-06-09 NOTE — Telephone Encounter (Signed)
No LOS 12/14

## 2019-06-10 ENCOUNTER — Telehealth: Payer: Self-pay | Admitting: *Deleted

## 2019-06-10 NOTE — Progress Notes (Signed)
Virtual Visit via Telephone Note  I connected with Kenneth Payne on 06/12/19 at 10:45 AM EST by telephone and verified that I am speaking with the correct person using two identifiers.  Location: Patient: Home  Provider: Clinic  This service was conducted via virtual visit.  The patient was located at home. I was located in my office.  Consent was obtained prior to the virtual visit and is aware of possible charges through their insurance for this visit.  The patient is an established patient.  Dr. Estanislado Pandy, MD conducted the virtual visit and Hazel Sams, PA-C acted as scribe during the service.  Office staff helped with scheduling follow up visits after the service was conducted.     I discussed the limitations, risks, security and privacy concerns of performing an evaluation and management service by telephone and the availability of in person appointments. I also discussed with the patient that there may be a patient responsible charge related to this service. The patient expressed understanding and agreed to proceed.  CC: Medication monitoring  History of Present Illness: Patient is a 58 year old male with a past medical history of inflammatory arthritis, UC, and DDD. He is taking xeljanz 11 mg XR daily, MTX 1 ml sq once weekly, and folic acid 2 mg po daily.  He denies any recent inflammatory arthritis flares. He states he experiences right knee joint pain if he overexerts.  He denies any effusion or inflammation at this time.  He states the right middle trigger finger continues to cause discomfort and locking.  He uses voltaren gel topically, which provides temporary relief.  He does not want to proceed with a cortisone injection yet.    Review of Systems  Constitutional: Negative for fever and malaise/fatigue.  Eyes: Negative for photophobia, pain, discharge and redness.  Respiratory: Negative for cough, shortness of breath and wheezing.   Cardiovascular: Negative for chest pain and  palpitations.  Gastrointestinal: Negative for blood in stool, constipation and diarrhea.  Genitourinary: Negative for dysuria.  Musculoskeletal: Positive for joint pain. Negative for back pain, myalgias and neck pain.  Skin: Negative for rash.  Neurological: Negative for dizziness and headaches.  Psychiatric/Behavioral: Negative for depression. The patient is not nervous/anxious and does not have insomnia.       Observations/Objective: Physical Exam  Constitutional: He is oriented to person, place, and time.  Neurological: He is alert and oriented to person, place, and time.  Psychiatric: Mood, memory, affect and judgment normal.   Patient reports morning stiffness for  0 minutes.   Patient denies nocturnal pain.  Difficulty dressing/grooming: Denies Difficulty climbing stairs: Denies Difficulty getting out of chair: Denies Difficulty using hands for taps, buttons, cutlery, and/or writing: Denies   Assessment and Plan: Visit Diagnoses: Other ulcerative colitis with other complication ([place]) - He had a routine colonoscopy on 07/04/18 that revealed patchy moderate inflammation in mid-ascending colon and sigmoid colon with multiple pseudopolyps secondary to UC. He was recommended to have a repeat colonoscopy in 1 year (January 2021).  He has not had any recent UC flares.  He denies any abdominal pain, constipation, or blood in stool currently.   He will continue taking Xeljanz 11 mg XR as prescribed.    Inflammatory arthritis -He has not had any recent flares. He has intermittent discomfort and mild inflammation in the right knee joint, which is exacerbated by strenuous activities.  He has no other joint pain or inflammation at this time.  He is clinically doing well on  Xeljanz 11 mg XR daily, MTX 1 ml sq once weekly, and folic acid 2 mg po daily.  He will continue on this current treatment regimen.  He was advised to notify us if he develops increased joint pain or joint swelling.  He will  follow up in 3 months.   High risk medication use - Xeljanz 10 mg 1 tablet twice daily, methotrexate 1 mL every 7 days, and folic acid 1 mg 2 tablets daily.  Last TB gold negative on 08/11/2018 and will monitor yearly.  Future order for TB old placed today.  Last lipid panel within normal limits on 10/01/2017.  CBC and CMP were drawn on 06/08/19. He will be due to update lab work in March and every 3 months. Standing orders are in place.     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  His discomfort is exacerbated by strenuous activities.  He has not had any recurrence of a joint effusion requiring an aspiration recently.  He was encouraged to use voltaren gel topically as needed for pain relief.  He will notify us if he develops increased joint pain or inflammation.   Trigger middle finger of right hand -He has ongoing triggering and tenderness.  His symptoms are worse first thing in the morning.  He uses voltaren gel topically as needed for pain relief.  We discussed using a splint.  He declined an ultrasound guided cortisone injection.   DDD (degenerative disc disease), cervical -He has no neck pain or stiffness at this time.  No symptoms of radiculopathy.   Other medical conditions are listed as follows:   Iron deficiency -Followed by Dr. Marin Olp   Thrombocytosis [facility]): Stable   Normocytic anemia  Follow Up Instructions: He will follow up in 3 months.    I discussed the assessment and treatment plan with the patient. The patient was provided an opportunity to ask questions and all were answered. The patient agreed with the plan and demonstrated an understanding of the instructions.   The patient was advised to call back or seek an in-person evaluation if the symptoms worsen or if the condition fails to improve as anticipated.  I provided 15 minutes of non-face-to-face time during this  encounter.   Bo Merino, MD   Scribed by-  Hazel Sams, PA-C

## 2019-06-10 NOTE — Telephone Encounter (Signed)
As noted below by Dr.Ennever, I left a message informing him that his iron level is great. Please keep taking the oral iron every other day. Call the office if you have any questions or concerns.

## 2019-06-10 NOTE — Telephone Encounter (Signed)
-----   Message from [name], MD sent at 06/09/2019  4:56 PM EST ----- Call - the iron level is great!!!  Keep taking the oral iron every other day!!  Kenneth Payne

## 2019-06-12 ENCOUNTER — Other Ambulatory Visit: Payer: Self-pay

## 2019-06-12 ENCOUNTER — Encounter: Payer: Self-pay | Admitting: Rheumatology

## 2019-06-12 ENCOUNTER — Telehealth (INDEPENDENT_AMBULATORY_CARE_PROVIDER_SITE_OTHER): Payer: PRIVATE HEALTH INSURANCE | Admitting: Rheumatology

## 2019-06-12 DIAGNOSIS — D473 Essential (hemorrhagic) thrombocythemia: Secondary | ICD-10-CM

## 2019-06-12 DIAGNOSIS — M5030 Other cervical disc degeneration, unspecified cervical region: Secondary | ICD-10-CM

## 2019-06-12 DIAGNOSIS — M1990 Unspecified osteoarthritis, unspecified site: Secondary | ICD-10-CM | POA: Diagnosis not present

## 2019-06-12 DIAGNOSIS — E611 Iron deficiency: Secondary | ICD-10-CM

## 2019-06-12 DIAGNOSIS — G8929 Other chronic pain: Secondary | ICD-10-CM

## 2019-06-12 DIAGNOSIS — M25461 Effusion, right knee: Secondary | ICD-10-CM

## 2019-06-12 DIAGNOSIS — M65331 Trigger finger, right middle finger: Secondary | ICD-10-CM

## 2019-06-12 DIAGNOSIS — K51818 Other ulcerative colitis with other complication: Secondary | ICD-10-CM | POA: Diagnosis not present

## 2019-06-12 DIAGNOSIS — M25561 Pain in right knee: Secondary | ICD-10-CM

## 2019-06-12 DIAGNOSIS — D75839 Thrombocytosis, unspecified: Secondary | ICD-10-CM

## 2019-06-12 DIAGNOSIS — Z79899 Other long term (current) drug therapy: Secondary | ICD-10-CM | POA: Diagnosis not present

## 2019-06-23 ENCOUNTER — Other Ambulatory Visit: Payer: Self-pay | Admitting: Rheumatology

## 2019-06-23 NOTE — Telephone Encounter (Signed)
Last Visit: 06/12/2019 telemedicine  Next Visit: message sent to the front desk to schedule.   Okay to refill per Dr. Estanislado Pandy.

## 2019-07-03 ENCOUNTER — Telehealth: Payer: Self-pay | Admitting: Rheumatology

## 2019-07-03 NOTE — Telephone Encounter (Signed)
I LMOM for patient to call, and schedule a follow up appointment for around 09/07/2019.

## 2019-07-03 NOTE — Telephone Encounter (Signed)
-----   Message from Shona Needles, RT sent at 06/12/2019 12:40 PM EST ----- Regarding: 3 MONTH F/U

## 2019-07-04 ENCOUNTER — Other Ambulatory Visit: Payer: Self-pay | Admitting: Rheumatology

## 2019-07-06 NOTE — Telephone Encounter (Signed)
Last Visit: 06/12/2019 telemedicine  Next Visit: 09/11/2019 Labs: 06/08/2019 glucose 100, RBC 4.19.  Okay to refill per Dr. Estanislado Pandy.

## 2019-07-21 ENCOUNTER — Ambulatory Visit: Payer: PRIVATE HEALTH INSURANCE | Attending: Internal Medicine

## 2019-07-21 DIAGNOSIS — Z20822 Contact with and (suspected) exposure to covid-19: Secondary | ICD-10-CM

## 2019-07-22 LAB — NOVEL CORONAVIRUS, NAA: SARS-CoV-2, NAA: NOT DETECTED

## 2019-07-23 ENCOUNTER — Other Ambulatory Visit: Payer: PRIVATE HEALTH INSURANCE

## 2019-08-20 ENCOUNTER — Other Ambulatory Visit: Payer: Self-pay | Admitting: *Deleted

## 2019-08-20 MED ORDER — "TUBERCULIN SYRINGE 27G X 1/2"" 1 ML MISC": 3 refills | Status: DC

## 2019-08-20 NOTE — Telephone Encounter (Signed)
Refill request received via fax  Last Visit: 06/12/2019 telemedicine  Next Visit: 09/11/2019  Okay to refill per Dr. Estanislado Pandy.

## 2019-08-25 ENCOUNTER — Other Ambulatory Visit: Payer: Self-pay

## 2019-08-25 DIAGNOSIS — Z79899 Other long term (current) drug therapy: Secondary | ICD-10-CM

## 2019-08-25 DIAGNOSIS — M1990 Unspecified osteoarthritis, unspecified site: Secondary | ICD-10-CM

## 2019-08-27 LAB — CBC WITH DIFFERENTIAL/PLATELET
Absolute Monocytes: 667 cells/uL (ref 200–950)
Basophils Absolute: 33 cells/uL (ref 0–200)
Basophils Relative: 0.5 %
Eosinophils Absolute: 73 cells/uL (ref 15–500)
Eosinophils Relative: 1.1 %
HCT: 42.4 % (ref 38.5–50.0)
Hemoglobin: 14.4 g/dL (ref 13.2–17.1)
Lymphs Abs: 1485 cells/uL (ref 850–3900)
MCH: 32.7 pg (ref 27.0–33.0)
MCHC: 34 g/dL (ref 32.0–36.0)
MCV: 96.4 fL (ref 80.0–100.0)
MPV: 10.7 fL (ref 7.5–12.5)
Monocytes Relative: 10.1 %
Neutro Abs: 4343 cells/uL (ref 1500–7800)
Neutrophils Relative %: 65.8 %
Platelets: 386 10*3/uL (ref 140–400)
RBC: 4.4 10*6/uL (ref 4.20–5.80)
RDW: 14.4 % (ref 11.0–15.0)
Total Lymphocyte: 22.5 %
WBC: 6.6 10*3/uL (ref 3.8–10.8)

## 2019-08-27 LAB — COMPLETE METABOLIC PANEL WITH GFR
AG Ratio: 1.9 (calc) (ref 1.0–2.5)
ALT: 24 U/L (ref 9–46)
AST: 22 U/L (ref 10–35)
Albumin: 4.7 g/dL (ref 3.6–5.1)
Alkaline phosphatase (APISO): 60 U/L (ref 35–144)
BUN: 14 mg/dL (ref 7–25)
CO2: 28 mmol/L (ref 20–32)
Calcium: 9.8 mg/dL (ref 8.6–10.3)
Chloride: 101 mmol/L (ref 98–110)
Creat: 1.15 mg/dL (ref 0.70–1.33)
GFR, Est African American: 81 mL/min/{1.73_m2} (ref >=60)
GFR, Est Non African American: 70 mL/min/{1.73_m2} (ref >=60)
Globulin: 2.5 g/dL (calc) (ref 1.9–3.7)
Glucose, Bld: 84 mg/dL (ref 65–99)
Potassium: 4.1 mmol/L (ref 3.5–5.3)
Sodium: 139 mmol/L (ref 135–146)
Total Bilirubin: 0.6 mg/dL (ref 0.2–1.2)
Total Protein: 7.2 g/dL (ref 6.1–8.1)

## 2019-08-27 LAB — QUANTIFERON-TB GOLD PLUS
Mitogen-NIL: 2.79 IU/mL
NIL: 0.03 IU/mL
QuantiFERON-TB Gold Plus: NEGATIVE
TB1-NIL: 0.02 IU/mL
TB2-NIL: 0.01 IU/mL

## 2019-08-27 NOTE — Progress Notes (Signed)
TB gold negative

## 2019-09-04 ENCOUNTER — Ambulatory Visit: Payer: PRIVATE HEALTH INSURANCE | Attending: Internal Medicine

## 2019-09-04 DIAGNOSIS — Z23 Encounter for immunization: Secondary | ICD-10-CM

## 2019-09-04 NOTE — Progress Notes (Signed)
   Covid-19 Vaccination Clinic  Name:  BURDETTE FOREHAND    MRN: [id number] DOB: 11/19/1960  09/04/2019  Mr. Larsh was observed post Covid-19 immunization for 15 minutes without incident. He was provided with Vaccine Information Sheet and instruction to access the V-Safe system.   Mr. Kroon was instructed to call 911 with any severe reactions post vaccine: Marland Kitchen Difficulty breathing  . Swelling of face and throat  . A fast heartbeat  . A bad rash all over body  . Dizziness and weakness   Immunizations Administered    Name Date Dose VIS Date Route   Pfizer COVID-19 Vaccine 09/04/2019 10:07 AM 0.3 mL 06/05/2019 Intramuscular   Manufacturer: [employer]   Lot: [id number]   [place]: [id number]

## 2019-09-04 NOTE — Progress Notes (Signed)
Office Visit Note  Patient: Kenneth Payne             Date of Birth: Mar 30, 1961           MRN: [id number]             PCP: Wenda Low, MD Referring: Wenda Low, MD Visit Date: 09/11/2019 Occupation: @GUAROCC @  Subjective:  Right knee effusion    History of Present Illness: Kenneth Payne is a 59 y.o. male with history of inflammatory arthritis and ulcerative colitis.  Patient is on Xeljanz 10 mg daily, methotrexate 1.0 ml sq injections once weekly, and folic acid 2 mg by mouth daily.  He has not missed any doses of these medications recently.  He presents today with a right knee joint effusion.  He would like to have the fluid aspirated.  He states that the stiffness and discomfort has been affecting his golf game.  He golfs 2-3 times per week.  He is also experiencing a right middle trigger finger.  He states the locking and tenderness has progressively been getting worse.  He would like to schedule a cortisone injection.  He has tried using Voltaren gel topically as needed for pain relief which has been effective temporarily.  Activities of Daily Living:  Patient reports morning stiffness for 0 none.   Patient Denies nocturnal pain.  Difficulty dressing/grooming: Denies Difficulty climbing stairs: Denies Difficulty getting out of chair: Denies Difficulty using hands for taps, buttons, cutlery, and/or writing: Reports  Review of Systems  Constitutional: Negative for fatigue and night sweats.  HENT: Negative for mouth sores, mouth dryness and nose dryness.   Eyes: Negative for redness and dryness.  Respiratory: Negative for shortness of breath and difficulty breathing.   Cardiovascular: Negative for chest pain, palpitations, hypertension, irregular heartbeat and swelling in legs/feet.  Gastrointestinal: Negative for constipation and diarrhea.  Endocrine: Negative for excessive thirst and increased urination.  Genitourinary: Negative for difficulty urinating and painful  urination.  Musculoskeletal: Positive for arthralgias, joint pain and joint swelling. Negative for myalgias, muscle weakness, morning stiffness, muscle tenderness and myalgias.  Skin: Negative for color change, rash, hair loss, nodules/bumps, skin tightness, ulcers and sensitivity to sunlight.  Allergic/Immunologic: Negative for susceptible to infections.  Neurological: Negative for dizziness, fainting, numbness, memory loss, night sweats and weakness.  Hematological: Negative for bruising/bleeding tendency and swollen glands.  Psychiatric/Behavioral: Negative for depressed mood and sleep disturbance. The patient is not nervous/anxious.     PMFS History:  Patient Active Problem List   Diagnosis Date Noted  . High risk medication use 05/25/2016  . Pain, neck 05/25/2016  . Arthralgia of both hands 05/25/2016  . Pain of both sacroiliac joints 05/25/2016  . Neck mass   . Thrombocytosis ([place]) 08/20/2013  . Normocytic anemia 08/19/2013  . Ulcerative colitis ([place]) 08/16/2013  . Ulcerative colitis, acute ([place]) 08/16/2013  . History of leukocytosis 08/16/2013    Past Medical History:  Diagnosis Date  . Hypertension   . Rheumatoid arthritis ([place])   . Ulcerative colitis ([place])     Family History  Problem Relation Age of Onset  . Asthma Mother   . Hypertension Mother   . Parkinson's disease Father   . Hypertension Father   . Stroke Father   . Heart attack Maternal Grandmother   . Heart attack Maternal Grandfather   . Stroke Paternal Grandmother   . Heart attack Paternal Grandfather   . Asthma Daughter    Past Surgical History:  Procedure Laterality Date  .  COLONOSCOPY N/A 08/18/2013   Procedure: COLONOSCOPY;  Surgeon: Winfield Cunas., MD;  Location: [facility];  Service: Endoscopy;  Laterality: N/A;  . CYST REMOVAL NECK    . HERNIA REPAIR     2009  . VASECTOMY     Social History   Social History Narrative  . Not on file   Immunization History  Administered Date(s)  Administered  . PFIZER SARS-COV-2 Vaccination 09/04/2019  . Zoster Recombinat (Shingrix) 07/10/2018     Objective: Vital Signs: BP 109/62 (BP Location: Left Arm, Patient Position: Sitting, Cuff Size: Normal)   Pulse 95   Resp 16   Ht 5' 7"  (1.702 m)   Wt 188 lb (85.3 kg)   BMI 29.44 kg/m    Physical Exam Vitals and nursing note reviewed.  Constitutional:      Appearance: He is well-developed.  HENT:     Head: Normocephalic and atraumatic.  Eyes:     Conjunctiva/sclera: Conjunctivae normal.     Pupils: Pupils are equal, round, and reactive to light.  Pulmonary:     Effort: Pulmonary effort is normal.  Abdominal:     General: Bowel sounds are normal.     Palpations: Abdomen is soft.  Musculoskeletal:     Cervical back: Normal range of motion and neck supple.  Skin:    General: Skin is warm and dry.     Capillary Refill: Capillary refill takes less than 2 seconds.  Neurological:     Mental Status: He is alert and oriented to person, place, and time.  Psychiatric:        Behavior: Behavior normal.      Musculoskeletal Exam: C-spine limited ROM.  Thoracic and lumbar spine good ROM.  Shoulder joints, elbow joints, wrist joints, MCPs, PIPs, and DIPs good ROM with no synovitis. Right middle trigger finger. Complete fist formation bilaterally.  Hip joints good ROM with no discomfort.  Right knee joint moderate effusion noted.  Left knee has good ROM with no warmth or effusion.  Ankle joints good ROM with no tenderness or synovitis.  No achilles tendonitis or plantar fasciitis.   CDAI Exam: CDAI Score: -- Patient Global: --; Provider Global: -- Swollen: --; Tender: -- Joint Exam 09/11/2019   No joint exam has been documented for this visit   There is currently no information documented on the homunculus. Go to the Rheumatology activity and complete the homunculus joint exam.  Investigation: No additional findings.  Imaging: No results found.  Recent Labs: Lab Results   Component Value Date   WBC 6.6 08/25/2019   HGB 14.4 08/25/2019   PLT 386 08/25/2019   NA 139 08/25/2019   K 4.1 08/25/2019   CL 101 08/25/2019   CO2 28 08/25/2019   GLUCOSE 84 08/25/2019   BUN 14 08/25/2019   CREATININE 1.15 08/25/2019   BILITOT 0.6 08/25/2019   ALKPHOS 67 06/08/2019   AST 22 08/25/2019   ALT 24 08/25/2019   PROT 7.2 08/25/2019   ALBUMIN 4.9 06/08/2019   CALCIUM 9.8 08/25/2019   GFRAA 81 08/25/2019   QFTBGOLDPLUS NEGATIVE 08/25/2019    Speciality Comments: No specialty comments available.  Procedures:  Large Joint Inj: R knee on 09/11/2019 11:35 AM Indications: pain Details: 27 G 1.5 in needle, medial approach  Arthrogram: No  Medications: 1.5 mL lidocaine 1 %; 60 mg triamcinolone acetonide 40 MG/ML Aspirate: 0 mL Outcome: tolerated well, no immediate complications Procedure, treatment alternatives, risks and benefits explained, specific risks discussed. Consent was given by  the patient. Immediately prior to procedure a time out was called to verify the correct patient, procedure, equipment, support staff and site/side marked as required. Patient was prepped and draped in the usual sterile fashion.     Allergies: Augmentin [amoxicillin-pot clavulanate] and Flagyl [metronidazole]   Assessment / Plan:     Visit Diagnoses: Inflammatory arthritis: He presents today with warmth and inflammation in the right knee joint.  No fluid was aspirated today.  He requested a cortisone injection today and tolerated the procedure well.  He is not having any other joint pain or joint swelling at this time.  Overall he is clinically been doing well on Xeljanz 10 mg 1 tablet by mouth twice daily, methotrexate 1 mg once weekly, folic acid 2 mg by mouth daily.  He has not missed any doses of these medications recently.  He will continue on the current treatment regimen.  He was advised to notify us if he develops increased joint pain or joint swelling.  He will follow-up in the  office in 5 months  High risk medication use - Xeljanz 10 mg 1 tablet twice daily, methotrexate 1 mL every 7 days, and folic acid 1 mg 2 tablets daily.  CBC and CMP were within normal limits on 08/25/2019.  TB gold was negative on 08/25/2019.  He will return for lab work in June and every 3 months.  Standing orders are in place.  Other ulcerative colitis with other complication [facility]): He has not had any recent ulcerative colitis flares.  He is clinically doing well on Xeljanz 10 mg 2 tablets daily, methotrexate 1 ml sq once weekly, folic acid 2 mg a mouth daily.  He has not had any blood in his stool recently.  He follows up with Dr. Collene Mares on a regular basis.  Effusion of right knee: He presents today with warmth and inflammation in the right knee joint.  He has been experiencing limited flexion of the right knee which has been interfering with him playing golf.  He requested a cortisone injection today.  60 mg of Kenalog was injected into the right knee joint.  He tolerated the procedure well.  No fluid was aspirated.  Trigger middle finger of right hand: He has been experiencing increased tenderness and locking.  He would like to schedule an ultrasound-guided cortisone injection.  In the meantime we discussed using buddy taped to the adjacent finger as well as using Voltaren gel topically as needed for pain relief.  DDD (degenerative disc disease), cervical: He has limited range of motion with no discomfort at this time.  Is not had any symptoms of radiculopathy.  Iron deficiency  Thrombocytosis ([place]): Platelet count within normal limits-386 on 08/25/2019.  Normocytic anemia: CBC within normal limits on 08/25/2019.  Orders: Orders Placed This Encounter  Procedures  . Large Joint Inj   Meds ordered this encounter  Medications  . folic acid (FOLVITE) 1 MG tablet    Sig: Take 2 tablets (2 mg total) by mouth daily.    Dispense:  180 tablet    Refill:  3      Follow-Up Instructions: Return in  about 5 months (around 02/11/2020) for Inflammatory arthriits .   Ofilia Neas, PA-C   I examined and evaluated the patient with Hazel Sams PA.  Patient has developed pain and discomfort in his right knee joint.  He has no minimal effusion, warmth and swelling.  Per his request after informed consent was obtained I injected his right knee  joint with cortisone as described above.  He tolerated the procedure well.  The plan of care was discussed as noted above.  Bo Merino, MD  Note - This record has been created using Editor, commissioning.  Chart creation errors have been sought, but may not always  have been located. Such creation errors do not reflect on  the standard of medical care.

## 2019-09-11 ENCOUNTER — Ambulatory Visit (INDEPENDENT_AMBULATORY_CARE_PROVIDER_SITE_OTHER): Payer: PRIVATE HEALTH INSURANCE | Admitting: Rheumatology

## 2019-09-11 ENCOUNTER — Other Ambulatory Visit: Payer: Self-pay

## 2019-09-11 ENCOUNTER — Encounter: Payer: Self-pay | Admitting: Rheumatology

## 2019-09-11 VITALS — BP 109/62 | HR 95 | Resp 16 | Ht 67.0 in | Wt 188.0 lb

## 2019-09-11 DIAGNOSIS — Z79899 Other long term (current) drug therapy: Secondary | ICD-10-CM

## 2019-09-11 DIAGNOSIS — M25461 Effusion, right knee: Secondary | ICD-10-CM

## 2019-09-11 DIAGNOSIS — D649 Anemia, unspecified: Secondary | ICD-10-CM

## 2019-09-11 DIAGNOSIS — D473 Essential (hemorrhagic) thrombocythemia: Secondary | ICD-10-CM

## 2019-09-11 DIAGNOSIS — M1990 Unspecified osteoarthritis, unspecified site: Secondary | ICD-10-CM

## 2019-09-11 DIAGNOSIS — K51818 Other ulcerative colitis with other complication: Secondary | ICD-10-CM

## 2019-09-11 DIAGNOSIS — D75839 Thrombocytosis, unspecified: Secondary | ICD-10-CM

## 2019-09-11 DIAGNOSIS — M65331 Trigger finger, right middle finger: Secondary | ICD-10-CM

## 2019-09-11 DIAGNOSIS — M5030 Other cervical disc degeneration, unspecified cervical region: Secondary | ICD-10-CM

## 2019-09-11 DIAGNOSIS — E611 Iron deficiency: Secondary | ICD-10-CM

## 2019-09-11 MED ORDER — FOLIC ACID 1 MG PO TABS: 2.0000 mg | ORAL_TABLET | Freq: Every day | ORAL | 3 refills | Status: DC

## 2019-09-11 NOTE — Patient Instructions (Signed)
Standing Labs We placed an order today for your standing lab work.    Please come back and get your standing labs in June and every 3 months   We have open lab daily Monday through Thursday from 8:30-12:30 PM and 1:30-4:30 PM and Friday from 8:30-12:30 PM and 1:30-4:00 PM at the office of Dr. Bo Merino.   You may experience shorter wait times on Monday and Friday afternoons. The office is located at [address], [place], [place], [place] [address] No appointment is necessary.   Labs are drawn by [facility].  You may receive a bill from [facility] for your lab work.  If you wish to have your labs drawn at another location, please call the office 24 hours in advance to send orders.  If you have any questions regarding directions or hours of operation,  please call [phone].   Just as a reminder please drink plenty of water prior to coming for your lab work. Thanks!

## 2019-09-27 IMAGING — US VENOUS DOPPLER ULTRASOUND OF LEFT LOWER EXTREMITY
1 series · 13 of 24 positions shown · non-contrast
Comparison: None.

CLINICAL DATA: Left thigh pain for 2 days, no known injury



[Series 1: venous doppler ultrasound of left lower extremity · 13 of 32 slices shown]
[im 1/32]
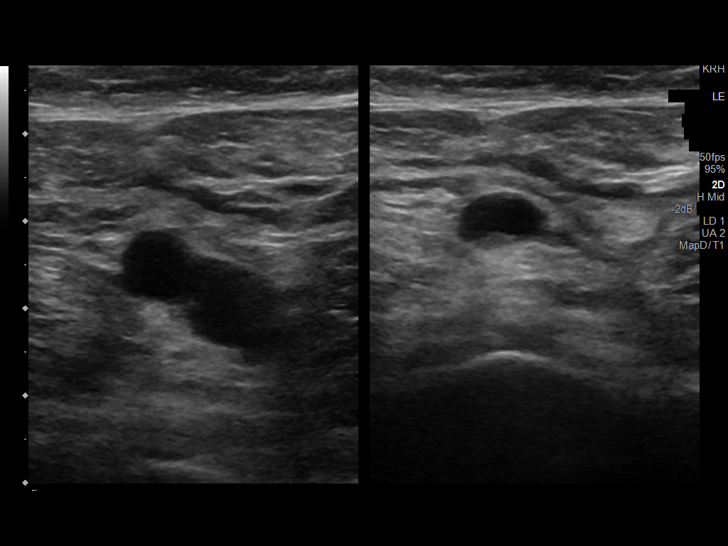
[im 3/32]
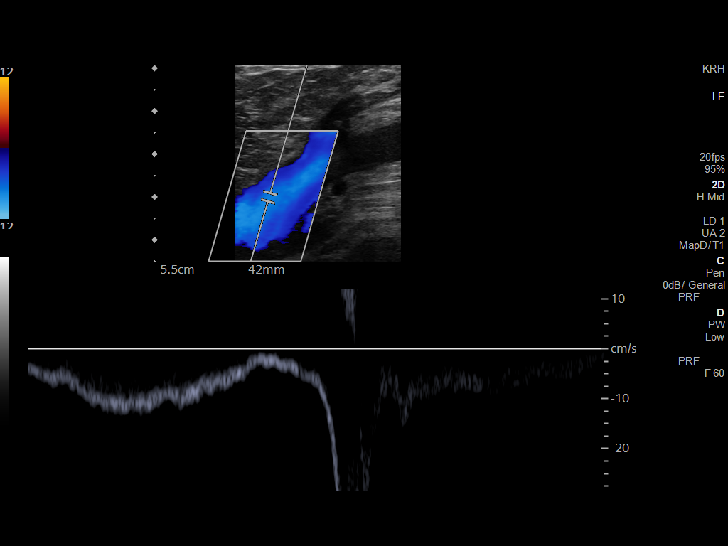
[im 6/32]
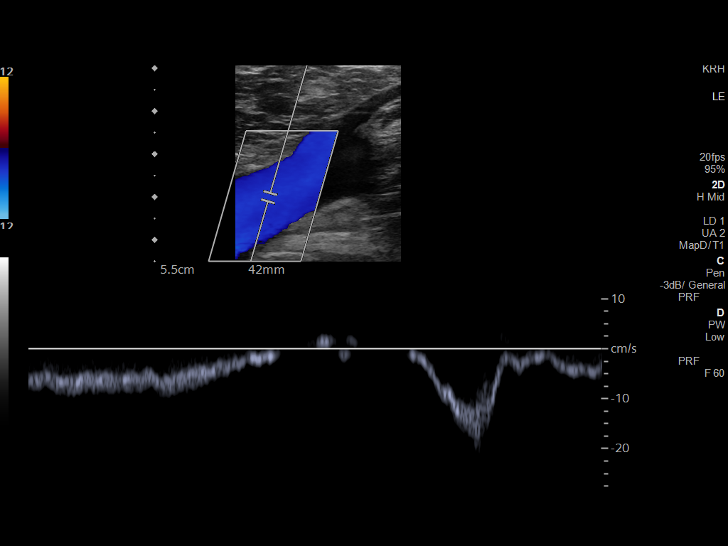
[im 9/32]
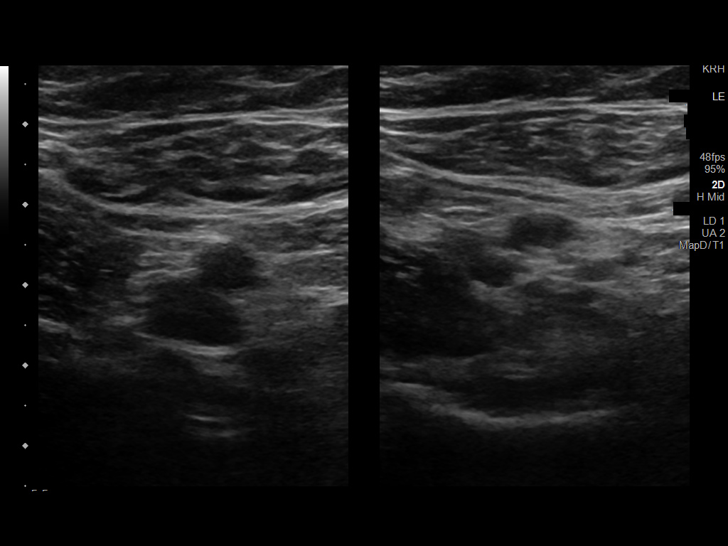
[im 11/32]
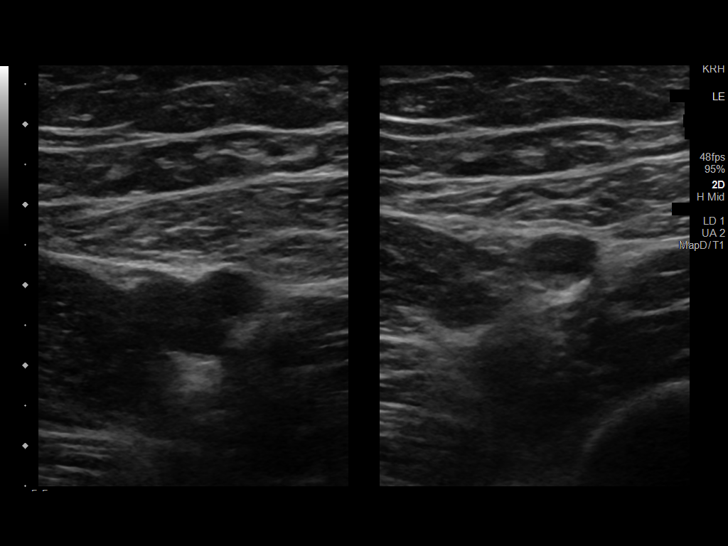
[im 14/32]
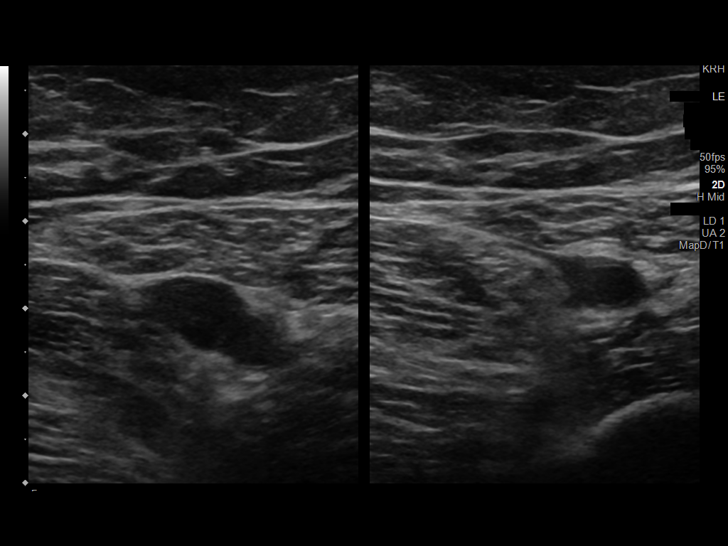
[im 17/32]
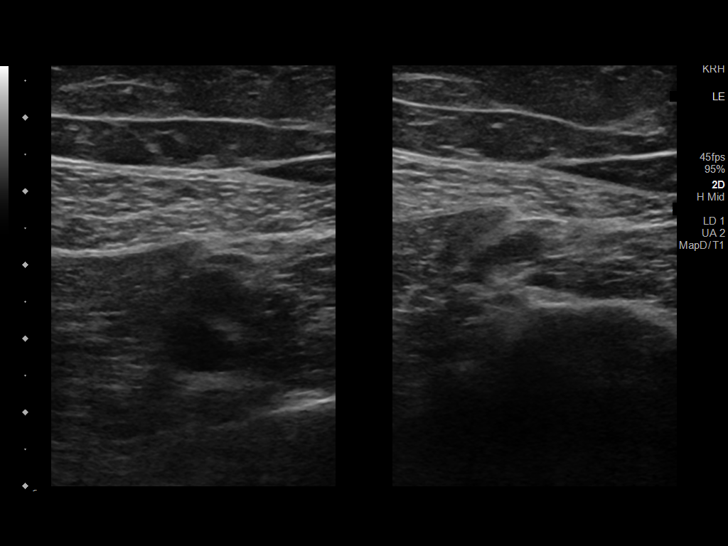
[im 18/32]
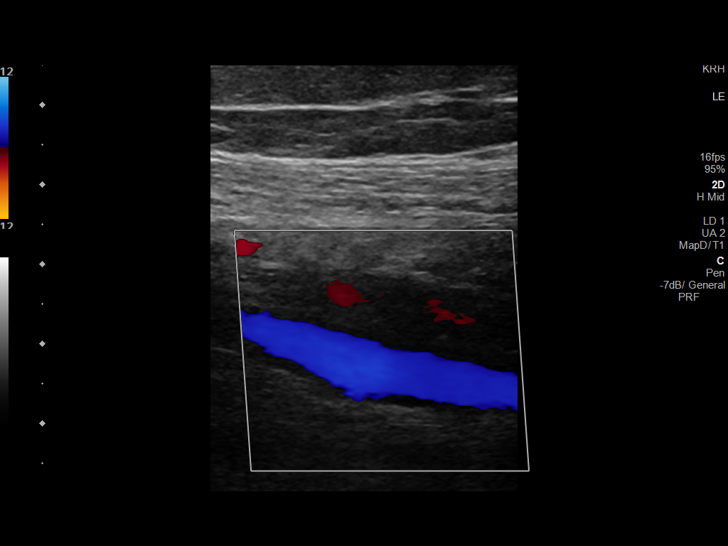
[im 21/32]
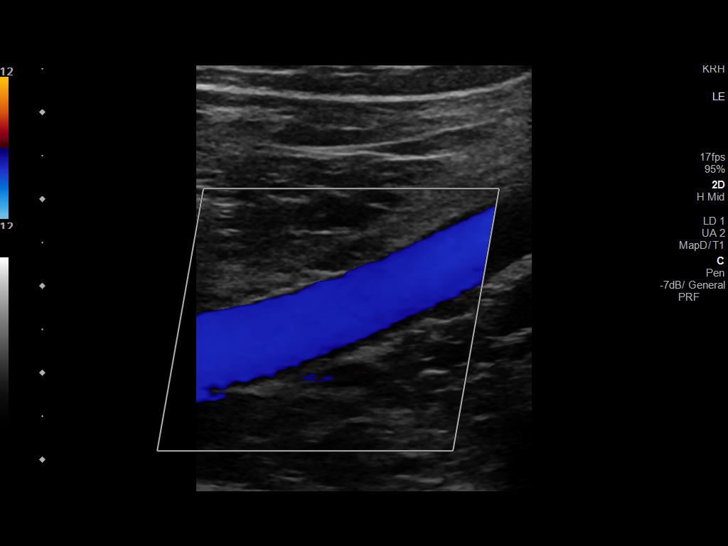
[im 23/32]
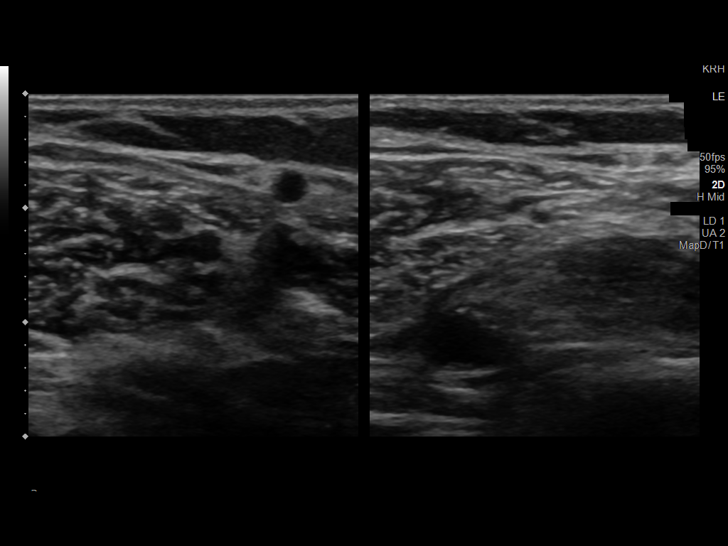
[im 26/32]
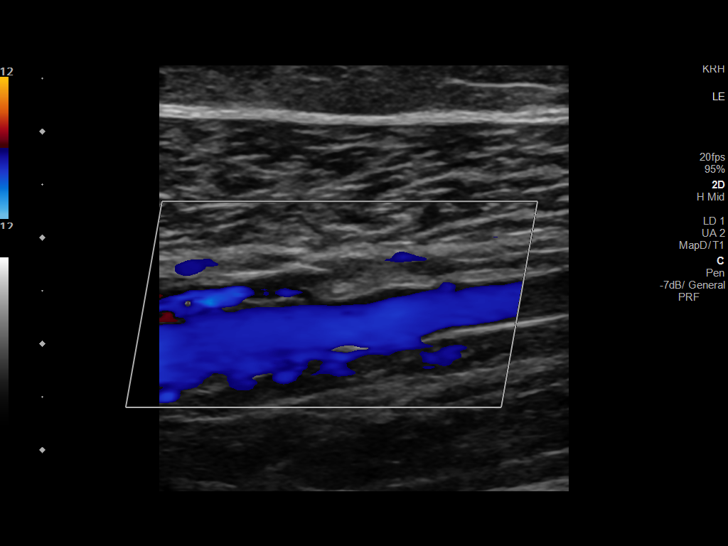
[im 29/32]
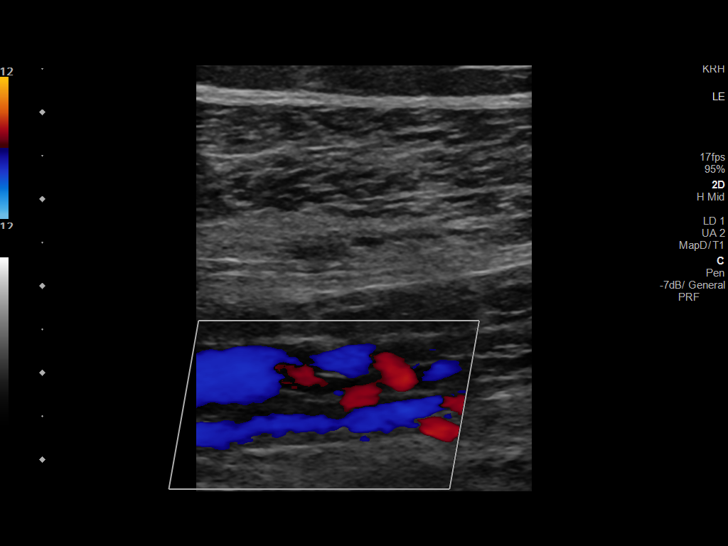
[im 32/32]
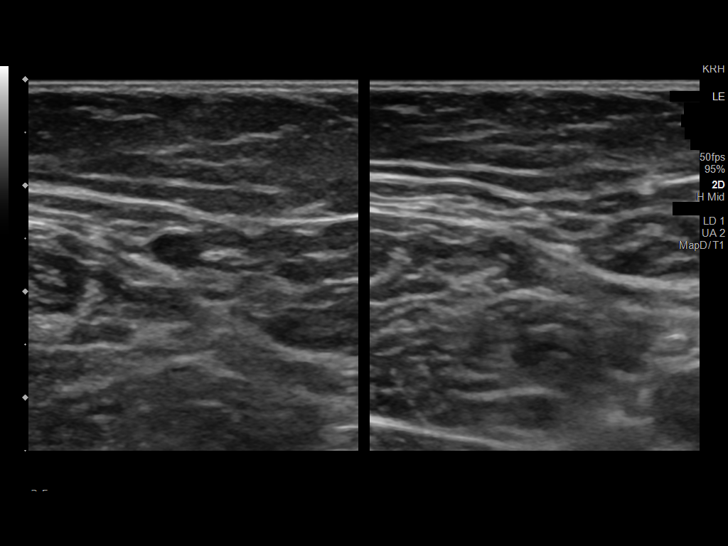

[13 of 24 positions shown; findings below may reference images not displayed]

FINDINGS: Contralateral Common Femoral Vein: Respiratory phasicity is normal
and symmetric with the symptomatic side. No evidence of thrombus.
Normal compressibility.

Common Femoral Vein: No evidence of thrombus. Normal
compressibility, respiratory phasicity and response to augmentation.

Saphenofemoral Junction: No evidence of thrombus. Normal
compressibility and flow on color Doppler imaging.

Profunda Femoral Vein: No evidence of thrombus. Normal
compressibility and flow on color Doppler imaging.

Femoral Vein: No evidence of thrombus. Normal compressibility,
respiratory phasicity and response to augmentation.

Popliteal Vein: No evidence of thrombus. Normal compressibility,
respiratory phasicity and response to augmentation.

Calf Veins: No evidence of thrombus. Normal compressibility and flow
on color Doppler imaging.

Superficial Great Saphenous Vein: No evidence of thrombus. Normal
compressibility.

Venous Reflux:  None.

Other Findings:  None.
IMPRESSION: No evidence of deep venous thrombosis.

## 2019-09-28 ENCOUNTER — Ambulatory Visit: Payer: PRIVATE HEALTH INSURANCE | Attending: Internal Medicine

## 2019-09-28 DIAGNOSIS — Z23 Encounter for immunization: Secondary | ICD-10-CM

## 2019-09-28 NOTE — Progress Notes (Signed)
   Covid-19 Vaccination Clinic  Name:  Kenneth Payne    MRN: [id number] DOB: 1960-09-19  09/28/2019  Kenneth Payne was observed post Covid-19 immunization for 15 minutes without incident. He was provided with Vaccine Information Sheet and instruction to access the V-Safe system.   Kenneth Payne was instructed to call 911 with any severe reactions post vaccine: Marland Kitchen Difficulty breathing  . Swelling of face and throat  . A fast heartbeat  . A bad rash all over body  . Dizziness and weakness   Immunizations Administered    Name Date Dose VIS Date Route   Pfizer COVID-19 Vaccine 09/28/2019  1:33 PM 0.3 mL 06/05/2019 Intramuscular   Manufacturer: [place]   Lot: [id number]   [place]: [address]-8

## 2019-10-16 ENCOUNTER — Other Ambulatory Visit: Payer: Self-pay | Admitting: Rheumatology

## 2019-10-16 ENCOUNTER — Telehealth: Payer: Self-pay | Admitting: Rheumatology

## 2019-10-16 NOTE — Telephone Encounter (Signed)
Attempted to contact patient and left message on machine to advise patient that methotrexate refill has been sent to the pharmacy. Advised patient to call our office or send a mychart message for refills if he is having difficulty with [facility] requesting refills.

## 2019-10-16 NOTE — Telephone Encounter (Signed)
Last Visit: 09/11/2019 Next Visit: 11/11/2019 Labs: 08/25/2019 CBC and CMP WNL  Okay to refill per Dr. Estanislado Pandy.

## 2019-10-16 NOTE — Telephone Encounter (Signed)
Patient called requesting prescription refill of Methotrexate.  Patient states he is out of medication and was due to take his injection yesterday.  Patient states he is having a difficult time with [facility] requesting refills on time.

## 2019-11-11 ENCOUNTER — Other Ambulatory Visit: Payer: PRIVATE HEALTH INSURANCE | Admitting: Rheumatology

## 2019-12-28 ENCOUNTER — Other Ambulatory Visit: Payer: Self-pay | Admitting: Rheumatology

## 2019-12-29 NOTE — Telephone Encounter (Addendum)
Last Visit: 09/11/2019 Next Visit: 02/12/2020  Labs: 08/25/2019 CBC and CMP WNL  Current Dose per office note on 09/11/2019: methotrexate 1 mL every 7 days DX: Inflammatory arthritis  Patient advised he is due to update labs. Patient states he recently had them done with his PCP. Patient will call to have them sent to our office.   Okay to refill MTX?

## 2019-12-31 ENCOUNTER — Telehealth: Payer: Self-pay | Admitting: *Deleted

## 2019-12-31 NOTE — Telephone Encounter (Signed)
Lab Results received from [facility] 12/07/2019 Reviewed by Hazel Sams, PA-C  CBC/CMP, Lipid Panel, PSA, Vitamin B12   RBC 4.01 MCV 100.9 MCH 34.8 Cholesterol 227 Calc LDL 133 Non HDL 159  Patient on MTX 1 mL weekly.

## 2020-02-05 ENCOUNTER — Encounter: Payer: Self-pay | Admitting: Rheumatology

## 2020-02-06 ENCOUNTER — Other Ambulatory Visit: Payer: Self-pay | Admitting: Rheumatology

## 2020-02-08 NOTE — Telephone Encounter (Signed)
Last Visit:09/11/2019 Next Visit: 02/12/2020  Labs: 12/07/2019 RBC 4.01 MCV 100.9 MCH 34.8  Current Dose per office note on 09/11/2019: methotrexate 1 mL every 7 days DX: Inflammatory arthritis  Okay to refill MTX?

## 2020-02-12 ENCOUNTER — Ambulatory Visit: Payer: PRIVATE HEALTH INSURANCE | Admitting: Physician Assistant

## 2020-02-15 NOTE — Progress Notes (Deleted)
Office Visit Note  Patient: Kenneth Payne             Date of Birth: [birth year]           MRN: [id number]             PCP: Wenda Low, MD Referring: Wenda Low, MD Visit Date: 02/26/2020 Occupation: @GUAROCC @  Subjective:  No chief complaint on file.   History of Present Illness: Kenneth Payne is a 59 y.o. male ***   Activities of Daily Living:  Patient reports morning stiffness for *** {minute/hour:19697}.   Patient {ACTIONS;DENIES/REPORTS:21021675::"Denies"} nocturnal pain.  Difficulty dressing/grooming: {ACTIONS;DENIES/REPORTS:21021675::"Denies"} Difficulty climbing stairs: {ACTIONS;DENIES/REPORTS:21021675::"Denies"} Difficulty getting out of chair: {ACTIONS;DENIES/REPORTS:21021675::"Denies"} Difficulty using hands for taps, buttons, cutlery, and/or writing: {ACTIONS;DENIES/REPORTS:21021675::"Denies"}  No Rheumatology ROS completed.   PMFS History:  Patient Active Problem List   Diagnosis Date Noted  . High risk medication use 05/25/2016  . Pain, neck 05/25/2016  . Arthralgia of both hands 05/25/2016  . Pain of both sacroiliac joints 05/25/2016  . Neck mass   . Thrombocytosis ([place]) 08/20/2013  . Normocytic anemia 08/19/2013  . Ulcerative colitis ([place]) 08/16/2013  . Ulcerative colitis, acute ([place]) 08/16/2013  . History of leukocytosis 08/16/2013    Past Medical History:  Diagnosis Date  . Hypertension   . Rheumatoid arthritis ([place])   . Ulcerative colitis ([place])     Family History  Problem Relation Age of Onset  . Asthma Mother   . Hypertension Mother   . Parkinson's disease Father   . Hypertension Father   . Stroke Father   . Heart attack Maternal Grandmother   . Heart attack Maternal Grandfather   . Stroke Paternal Grandmother   . Heart attack Paternal Grandfather   . Asthma Daughter    Past Surgical History:  Procedure Laterality Date  . COLONOSCOPY N/A 08/18/2013   Procedure: COLONOSCOPY;  Surgeon: Winfield Cunas., MD;  Location: [facility];  Service: Endoscopy;  Laterality: N/A;  . CYST REMOVAL NECK    . HERNIA REPAIR     2009  . VASECTOMY     Social History   Social History Narrative  . Not on file   Immunization History  Administered Date(s) Administered  . PFIZER SARS-COV-2 Vaccination 09/04/2019, 09/28/2019  . Zoster Recombinat (Shingrix) 07/10/2018     Objective: Vital Signs: There were no vitals taken for this visit.   Physical Exam   Musculoskeletal Exam: ***  CDAI Exam: CDAI Score: -- Patient Global: --; Provider Global: -- Swollen: --; Tender: -- Joint Exam 02/26/2020   No joint exam has been documented for this visit   There is currently no information documented on the homunculus. Go to the Rheumatology activity and complete the homunculus joint exam.  Investigation: No additional findings.  Imaging: No results found.  Recent Labs: Lab Results  Component Value Date   WBC 6.6 08/25/2019   HGB 14.4 08/25/2019   PLT 386 08/25/2019   NA 139 08/25/2019   K 4.1 08/25/2019   CL 101 08/25/2019   CO2 28 08/25/2019   GLUCOSE 84 08/25/2019   BUN 14 08/25/2019   CREATININE 1.15 08/25/2019   BILITOT 0.6 08/25/2019   ALKPHOS 67 06/08/2019   AST 22 08/25/2019   ALT 24 08/25/2019   PROT 7.2 08/25/2019   ALBUMIN 4.9 06/08/2019   CALCIUM 9.8 08/25/2019   GFRAA 81 08/25/2019   QFTBGOLDPLUS NEGATIVE 08/25/2019    Speciality Comments: No specialty comments available.  Procedures:  No procedures performed  Allergies: Augmentin [amoxicillin-pot clavulanate] and Flagyl [metronidazole]   Assessment / Plan:     Visit Diagnoses: No diagnosis found.  Orders: No orders of the defined types were placed in this encounter.  No orders of the defined types were placed in this encounter.   Face-to-face time spent with patient was *** minutes. Greater than 50% of time was spent in counseling and coordination of care.  Follow-Up Instructions: No follow-ups on file.   Earnestine Mealing,  CMA  Note - This record has been created using Editor, commissioning.  Chart creation errors have been sought, but may not always  have been located. Such creation errors do not reflect on  the standard of medical care.

## 2020-02-26 ENCOUNTER — Ambulatory Visit: Payer: PRIVATE HEALTH INSURANCE | Admitting: Physician Assistant

## 2020-03-17 NOTE — Progress Notes (Signed)
Office Visit Note  Patient: Kenneth Payne             Date of Birth: 1961-06-20           MRN: [id number]             PCP: Wenda Low, MD Referring: Wenda Low, MD Visit Date: 03/31/2020 Occupation: @GUAROCC @  Subjective:  Medication monitoring   History of Present Illness: Kenneth Payne is a 59 y.o. male with history of chronic inflammatory arthritis and ulcerative colitis.  Patient is taking Xeljanz 10 mg 1 tablet by mouth twice daily, methotrexate 1.0 mL subcu injections once weekly, and folic acid 2 mg by mouth daily.  He has not missed any doses of Xeljanz or methotrexate recently.  He denies any recent flares.  He experiences occasional discomfort in his right knee joint especially when golfing or going down steps.  He denies any increased warmth or joint swelling at this time.  He states that he notices his right middle trigger finger locking at times but denies any tenderness or discomfort at this time.  He denies any other joint pain or joint swelling at this time.  He denies any SI joint pain currently.  He denies any Achilles denies or plantar fasciitis.  He has not had any recent rashes.  He has not had any symptoms of eye inflammation recently.  He denies any recent ulcerative colitis flares.  He had an updated colonoscopy on 02/05/2020 performed by Dr. Collene Mares.  He denies any abdominal pain, constipation, diarrhea, or blood in his stool at this time. He denies any recent infections.  He has received both covid-19 vaccines.     Activities of Daily Living:  Patient reports morning stiffness for 0 minutes.   Patient Denies nocturnal pain.  Difficulty dressing/grooming: Denies Difficulty climbing stairs: Denies Difficulty getting out of chair: Denies Difficulty using hands for taps, buttons, cutlery, and/or writing: Denies  Review of Systems  Constitutional: Negative for fatigue.  HENT: Negative for mouth sores, mouth dryness and nose dryness.   Eyes: Negative for pain,  itching, visual disturbance and dryness.  Respiratory: Negative for cough, hemoptysis, shortness of breath and difficulty breathing.   Cardiovascular: Negative for chest pain, palpitations and swelling in legs/feet.  Gastrointestinal: Negative for abdominal pain, blood in stool, constipation and diarrhea.  Endocrine: Negative for increased urination.  Genitourinary: Negative for painful urination.  Musculoskeletal: Positive for arthralgias (Right knee-intermittently), joint pain (Right knee-intermittently) and joint swelling. Negative for myalgias, muscle weakness, morning stiffness, muscle tenderness and myalgias.  Skin: Negative for color change, rash and redness.  Allergic/Immunologic: Negative for susceptible to infections.  Neurological: Negative for dizziness, headaches, memory loss and weakness.  Hematological: Negative for swollen glands.  Psychiatric/Behavioral: Negative for confusion and sleep disturbance.    PMFS History:  Patient Active Problem List   Diagnosis Date Noted  . High risk medication use 05/25/2016  . Pain, neck 05/25/2016  . Arthralgia of both hands 05/25/2016  . Pain of both sacroiliac joints 05/25/2016  . Neck mass   . Thrombocytosis 08/20/2013  . Normocytic anemia 08/19/2013  . Ulcerative colitis ([place]) 08/16/2013  . Ulcerative colitis, acute ([place]) 08/16/2013  . History of leukocytosis 08/16/2013    Past Medical History:  Diagnosis Date  . Hypertension   . Rheumatoid arthritis ([place])   . Ulcerative colitis ([place])     Family History  Problem Relation Age of Onset  . Asthma Mother   . Hypertension Mother   . Parkinson's  disease Father   . Hypertension Father   . Stroke Father   . Heart attack Maternal Grandmother   . Heart attack Maternal Grandfather   . Stroke Paternal Grandmother   . Heart attack Paternal Grandfather   . Asthma Daughter    Past Surgical History:  Procedure Laterality Date  . COLONOSCOPY N/A 08/18/2013   Procedure: COLONOSCOPY;   Surgeon: Winfield Cunas., MD;  Location: [facility];  Service: Endoscopy;  Laterality: N/A;  . CYST REMOVAL NECK    . HERNIA REPAIR     2009  . VASECTOMY     Social History   Social History Narrative  . Not on file   Immunization History  Administered Date(s) Administered  . PFIZER SARS-COV-2 Vaccination 09/04/2019, 09/28/2019  . Zoster Recombinat (Shingrix) 07/10/2018     Objective: Vital Signs: BP 122/78 (BP Location: Left Arm, Patient Position: Sitting, Cuff Size: Small)   Pulse 74   Ht 5' 7"  (1.702 m)   Wt 187 lb (84.8 kg)   BMI 29.29 kg/m    Physical Exam Vitals and nursing note reviewed.  Constitutional:      Appearance: He is well-developed.  HENT:     Head: Normocephalic and atraumatic.  Eyes:     Conjunctiva/sclera: Conjunctivae normal.     Pupils: Pupils are equal, round, and reactive to light.  Pulmonary:     Effort: Pulmonary effort is normal.  Abdominal:     Palpations: Abdomen is soft.  Musculoskeletal:     Cervical back: Normal range of motion and neck supple.  Skin:    General: Skin is warm and dry.     Capillary Refill: Capillary refill takes less than 2 seconds.  Neurological:     Mental Status: He is alert and oriented to person, place, and time.  Psychiatric:        Behavior: Behavior normal.      Musculoskeletal Exam: C-spine has limited range of motion with lateral rotation.  Thoracic and lumbar spine have good range of motion with no discomfort.  No midline spinal tenderness.  No SI joint tenderness.  Shoulder joints, elbow joints, wrist joints, MCPs, PIPs, DIPs have good range of motion with no synovitis.  He is able to make a complete fist bilaterally.  Right middle trigger finger.  Hip joints have good range of motion with no discomfort.  Right knee has mild inflammation but no effusion noted.  Left knee has good range of motion with no warmth or effusion.  Ankle joints have good range of motion with no tenderness or inflammation.  No  tenderness of Achilles tendons or plantar fascia.   CDAI Exam: CDAI Score: -- Patient Global: --; Provider Global: -- Swollen: --; Tender: -- Joint Exam 03/31/2020   No joint exam has been documented for this visit   There is currently no information documented on the homunculus. Go to the Rheumatology activity and complete the homunculus joint exam.  Investigation: No additional findings.  Imaging: No results found.  Recent Labs: Lab Results  Component Value Date   WBC 6.6 08/25/2019   HGB 14.4 08/25/2019   PLT 386 08/25/2019   NA 139 08/25/2019   K 4.1 08/25/2019   CL 101 08/25/2019   CO2 28 08/25/2019   GLUCOSE 84 08/25/2019   BUN 14 08/25/2019   CREATININE 1.15 08/25/2019   BILITOT 0.6 08/25/2019   ALKPHOS 67 06/08/2019   AST 22 08/25/2019   ALT 24 08/25/2019   PROT 7.2 08/25/2019  ALBUMIN 4.9 06/08/2019   CALCIUM 9.8 08/25/2019   GFRAA 81 08/25/2019   QFTBGOLDPLUS NEGATIVE 08/25/2019    Speciality Comments: No specialty comments available.  Procedures:  No procedures performed Allergies: Augmentin [amoxicillin-pot clavulanate] and Flagyl [metronidazole]   Assessment / Plan:     Visit Diagnoses: Inflammatory arthritis: He has no synovitis or dactylitis on exam.  He has not had any recent flares of inflammatory arthritis.  He has clinically been doing well on Xeljanz 10 mg 1 tablet by mouth twice daily, methotrexate 1 mL subcutaneous injections once weekly, folic acid 2 mg by mouth daily.  He has not missed any doses of methotrexate or Morrie Sheldon recently.  He has not had any recent infections.  He experiences intermittent discomfort in his right knee joint which is exacerbated by strenuous activities or certain positions while golfing.  He experiences some stiffness in his right knee after sitting for prolonged periods of time.  He had a right knee joint cortisone injection performed on 09/11/2019 which provided significant relief.  He declined a repeat injection  today.  He has good range of motion of the right knee joint on exam with some mild inflammation but no joint effusion was noted.  He is not experiencing any right knee joint pain at this time.  He has no other joint pain or inflammation at this time.  He is not experience any SI joint discomfort and has no tenderness on examination today.  He has no Achilles tendinitis or plantar fasciitis.  He will continue taking Xeljanz 10 mg 1 tablet by mouth twice daily, methotrexate 1 mL subcu injections once weekly, and folic acid 2 mg by mouth daily.  He does not need any refills at this time.  He was advised to notify us if he develops increased joint pain or joint swelling.  He will follow-up in the office in 5 months.  High risk medication use - Xeljanz 10 mg 1 tablet by mouth twice daily-Prescribed by Dr. Collene Mares, Methotrexate 1 mL sq injection every 7 days, and folic acid 1 mg 2 tablets daily.  12/29/2019 CBC and CMP were updated.  He is due to update lab work today.  Orders for CBC and CMP were released.  Standing orders for CBC and CMP were placed today.  He will be due to update lab work in January and every 3 months to monitor for drug toxicity.  TB gold was negative on 08/25/2019 and will continue to be monitored yearly.- Plan: CBC with Differential/Platelet, COMPLETE METABOLIC PANEL WITH GFR He has not had any recent infections.  We discussed the importance of holding Xeljanz and methotrexate if he develops signs or symptoms of an infection and to resume once the infection has completely cleared.  He has received both COVID-19 vaccinations and was encouraged to receive the third dose.  He was advised to hold Xeljanz and methotrexate 1 week after receiving a third dose.  He was also advised to hold Tylenol and NSAIDs 24 hours prior to the third dose.  He was advised to notify us or his PCP if he develops a COVID-19 infection in order to receive the antibody infusion.  He voiced understanding.  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 Need to monitor blood pressure, cholesterol, and to exercise 30-60 minutes on daily basis was discussed.  I discussed the new blocks box warning for Jak inhibitors like xeljanz.  We discussed the importance of minimizing risk factors.  All questions were addressed.  Other  ulcerative colitis with other complication [facility]): He has not had any signs or symptoms of an ulcerative colitis flare recently.  He is followed closely by Dr. Collene Mares.  He had an updated colonoscopy performed on 02/05/2020.  He is not experiencing any abdominal pain, constipation, diarrhea, or blood in his stool at this time.  He will continue taking Xeljanz 10 mg 1 tablet twice daily prescribed by Dr. Collene Mares.  Effusion of right knee - injected 09/11/2019.   Trigger middle finger of right hand: He experiences intermittent locking but is not having any tenderness or discomfort at this time.  He declined scheduling ultrasound-guided cortisone injection at this time.  DDD (degenerative disc disease), cervical: He has limited range of motion of the C-spine especially with lateral rotation.  He is not experiencing any discomfort or symptoms of radiculopathy at this time.  Iron deficiency  Normocytic anemia: CBC order released today.   Thrombocytosis: Platelet count was within normal limits-386 on 08/25/2019.  Plt count will be checked today.   Orders: Orders Placed This Encounter  Procedures  . CBC with Differential/Platelet  . COMPLETE METABOLIC PANEL WITH GFR  . CBC with Differential/Platelet  . COMPLETE METABOLIC PANEL WITH GFR   No orders of the defined types were placed in this encounter.    Follow-Up Instructions: Return in about 5 months (around 08/29/2020).   Ofilia Neas, PA-C  Note - This record has been created using Dragon software.  Chart creation errors have been sought, but may not always  have been located. Such creation errors do not reflect on  the standard of  medical care.

## 2020-03-31 ENCOUNTER — Other Ambulatory Visit: Payer: Self-pay

## 2020-03-31 ENCOUNTER — Encounter: Payer: Self-pay | Admitting: Physician Assistant

## 2020-03-31 ENCOUNTER — Ambulatory Visit (INDEPENDENT_AMBULATORY_CARE_PROVIDER_SITE_OTHER): Payer: PRIVATE HEALTH INSURANCE | Admitting: Physician Assistant

## 2020-03-31 VITALS — BP 122/78 | HR 74 | Ht 67.0 in | Wt 187.0 lb

## 2020-03-31 DIAGNOSIS — M1990 Unspecified osteoarthritis, unspecified site: Secondary | ICD-10-CM

## 2020-03-31 DIAGNOSIS — Z79899 Other long term (current) drug therapy: Secondary | ICD-10-CM | POA: Diagnosis not present

## 2020-03-31 DIAGNOSIS — K51818 Other ulcerative colitis with other complication: Secondary | ICD-10-CM

## 2020-03-31 DIAGNOSIS — E611 Iron deficiency: Secondary | ICD-10-CM

## 2020-03-31 DIAGNOSIS — D649 Anemia, unspecified: Secondary | ICD-10-CM

## 2020-03-31 DIAGNOSIS — D75839 Thrombocytosis, unspecified: Secondary | ICD-10-CM

## 2020-03-31 DIAGNOSIS — M5030 Other cervical disc degeneration, unspecified cervical region: Secondary | ICD-10-CM

## 2020-03-31 DIAGNOSIS — M25461 Effusion, right knee: Secondary | ICD-10-CM

## 2020-03-31 DIAGNOSIS — M65331 Trigger finger, right middle finger: Secondary | ICD-10-CM

## 2020-03-31 NOTE — Patient Instructions (Signed)
COVID-19 vaccine recommendations:   COVID-19 vaccine is recommended for everyone (unless you are allergic to a vaccine component), even if you are on a medication that suppresses your immune system.   If you are on Methotrexate, Cellcept (mycophenolate), Rinvoq, Morrie Sheldon, and Olumiant- hold the medication for 1 week after each vaccine. Hold Methotrexate for 2 weeks after the single dose COVID-19 vaccine.   If you are on Orencia subcutaneous injection - hold medication one week prior to and one week after the first COVID-19 vaccine dose (only).   If you are on Orencia IV infusions- time vaccination administration so that the first COVID-19 vaccination will occur four weeks after the infusion and postpone the subsequent infusion by one week.   If you are on Cyclophosphamide or Rituxan infusions please contact your doctor prior to receiving the COVID-19 vaccine.   Do not take Tylenol or any anti-inflammatory medications (NSAIDs) 24 hours prior to the COVID-19 vaccination.   There is no direct evidence about the efficacy of the COVID-19 vaccine in individuals who are on medications that suppress the immune system.   Even if you are fully vaccinated, and you are on any medications that suppress your immune system, please continue to wear a mask, maintain at least six feet social distance and practice hand hygiene.   If you develop a COVID-19 infection, please contact your PCP or our office to determine if you need antibody infusion.  The booster vaccine is now available for immunocompromised patients. It is advised that if you had Pfizer vaccine you should get Coca[employer]a booster.  If you had a Moderna vaccine then you should get a Moderna booster. Johnson and Wynetta Emery does not have a booster vaccine at this time.  Please see the following web sites for updated information.    https://www.[email or web address].org/Portals/0/Files/COVID-19-Vaccination-Patient-Resources.pdf  https://[email or web address].org/About-Us/Newsroom/Press-Releases/ID/1159  Standing Labs We placed an order today for your standing lab work.   Please have your standing labs drawn in January and every 3 months   If possible, please have your labs drawn 2 weeks prior to your appointment so that the provider can discuss your results at your appointment.  We have open lab daily Monday through Thursday from 8:30-12:30 PM and 1:30-4:30 PM and Friday from 8:30-12:30 PM and 1:30-4:00 PM at the office of Dr. Bo Merino, [facility].   Please be advised, patients with office appointments requiring lab work will take precedents over walk-in lab work.  If possible, please come for your lab work on Monday and Friday afternoons, as you may experience shorter wait times. The office is located at [address] [place], [place], [place] [address] No appointment is necessary.   Labs are drawn by [facility]. Please bring your co-pay at the time of your lab draw.  You may receive a bill from [facility] for your lab work.  If you wish to have your labs drawn at another location, please call the office 24 hours in advance to send orders.  If you have any questions regarding directions or hours of operation,  please call [phone].   As a reminder, please drink plenty of water prior to coming for your lab work. Thanks!

## 2020-04-01 ENCOUNTER — Telehealth: Payer: Self-pay | Admitting: *Deleted

## 2020-04-01 DIAGNOSIS — M1990 Unspecified osteoarthritis, unspecified site: Secondary | ICD-10-CM

## 2020-04-01 DIAGNOSIS — Z79899 Other long term (current) drug therapy: Secondary | ICD-10-CM

## 2020-04-01 LAB — COMPLETE METABOLIC PANEL WITH GFR
AG Ratio: 1.8 (calc) (ref 1.0–2.5)
ALT: 24 U/L (ref 9–46)
AST: 22 U/L (ref 10–35)
Albumin: 4.5 g/dL (ref 3.6–5.1)
Alkaline phosphatase (APISO): 57 U/L (ref 35–144)
BUN: 15 mg/dL (ref 7–25)
CO2: 27 mmol/L (ref 20–32)
Calcium: 9.8 mg/dL (ref 8.6–10.3)
Chloride: 102 mmol/L (ref 98–110)
Creat: 1.16 mg/dL (ref 0.70–1.33)
GFR, Est African American: 80 mL/min/{1.73_m2} (ref >=60)
GFR, Est Non African American: 69 mL/min/{1.73_m2} (ref >=60)
Globulin: 2.5 g/dL (calc) (ref 1.9–3.7)
Glucose, Bld: 83 mg/dL (ref 65–99)
Potassium: 4.5 mmol/L (ref 3.5–5.3)
Sodium: 140 mmol/L (ref 135–146)
Total Bilirubin: 0.5 mg/dL (ref 0.2–1.2)
Total Protein: 7 g/dL (ref 6.1–8.1)

## 2020-04-01 LAB — CBC WITH DIFFERENTIAL/PLATELET
Absolute Monocytes: 690 cells/uL (ref 200–950)
Basophils Absolute: 46 cells/uL (ref 0–200)
Basophils Relative: 0.5 %
Eosinophils Absolute: 46 cells/uL (ref 15–500)
Eosinophils Relative: 0.5 %
HCT: 39.3 % (ref 38.5–50.0)
Hemoglobin: 13.6 g/dL (ref 13.2–17.1)
Lymphs Abs: 1030 cells/uL (ref 850–3900)
MCH: 34.1 pg — ABNORMAL HIGH (ref 27.0–33.0)
MCHC: 34.6 g/dL (ref 32.0–36.0)
MCV: 98.5 fL (ref 80.0–100.0)
MPV: 10.8 fL (ref 7.5–12.5)
Monocytes Relative: 7.5 %
Neutro Abs: 7388 cells/uL (ref 1500–7800)
Neutrophils Relative %: 80.3 %
Platelets: 403 10*3/uL — ABNORMAL HIGH (ref 140–400)
RBC: 3.99 10*6/uL — ABNORMAL LOW (ref 4.20–5.80)
RDW: 14.3 % (ref 11.0–15.0)
Total Lymphocyte: 11.2 %
WBC: 9.2 10*3/uL (ref 3.8–10.8)

## 2020-04-01 NOTE — Progress Notes (Signed)
Hemoglobin and hematocrit are WNL.  We will continue to monitor.   We can check folate and b12 with upcoming lab work.

## 2020-04-01 NOTE — Progress Notes (Signed)
RBC count is mildly low.  MCH is borderline elevated. Please make sure the patient is taking folic acid 1 mg 2 tablets daily.   Plts are borderline elevated-403.  We will continue to monitor.  CMP WNL.

## 2020-04-01 NOTE — Telephone Encounter (Signed)
-----   Message from Ofilia Neas, PA-C sent at 04/01/2020  9:05 AM EDT ----- Hemoglobin and hematocrit are WNL.  We will continue to monitor.   We can check folate and b12 with upcoming lab work.

## 2020-04-29 ENCOUNTER — Other Ambulatory Visit: Payer: Self-pay | Admitting: Physician Assistant

## 2020-04-29 NOTE — Telephone Encounter (Signed)
Last Visit: 04/10/2020 Next Visit: 09/09/2020 Labs: 03/31/2020 RBC count is mildly low. MCH is borderline elevated. Plts are borderline elevated-403. CMP WNL  Current Dose per office note 03/31/2020: Methotrexate 1 mL sq injection every 7 days DX: Inflammatory arthritis  Okay to refill MTX?

## 2020-05-13 ENCOUNTER — Ambulatory Visit (HOSPITAL_COMMUNITY)
Admission: RE | Admit: 2020-05-13 | Discharge: 2020-05-13 | Disposition: A | Discharge status: Home / Self Care | Payer: PRIVATE HEALTH INSURANCE | Source: Ambulatory Visit | Attending: Pulmonary Disease | Admitting: Pulmonary Disease

## 2020-05-13 ENCOUNTER — Telehealth (HOSPITAL_COMMUNITY): Payer: Self-pay

## 2020-05-13 ENCOUNTER — Other Ambulatory Visit: Payer: Self-pay | Admitting: Physician Assistant

## 2020-05-13 DIAGNOSIS — U071 COVID-19: Secondary | ICD-10-CM

## 2020-05-13 DIAGNOSIS — M057A Rheumatoid arthritis with rheumatoid factor of other specified site without organ or systems involvement: Secondary | ICD-10-CM

## 2020-05-13 DIAGNOSIS — M069 Rheumatoid arthritis, unspecified: Secondary | ICD-10-CM | POA: Insufficient documentation

## 2020-05-13 DIAGNOSIS — I10 Essential (primary) hypertension: Secondary | ICD-10-CM

## 2020-05-13 DIAGNOSIS — K51018 Ulcerative (chronic) pancolitis with other complication: Secondary | ICD-10-CM

## 2020-05-13 MED ORDER — SODIUM CHLORIDE 0.9 % IV SOLN: INTRAVENOUS | Status: DC | PRN

## 2020-05-13 MED ORDER — ALBUTEROL SULFATE HFA 108 (90 BASE) MCG/ACT IN AERS: 2.0000 | INHALATION_SPRAY | Freq: Once | RESPIRATORY_TRACT | Status: DC | PRN

## 2020-05-13 MED ORDER — SOTROVIMAB 500 MG/8ML IV SOLN
500.0000 mg | Freq: Once | INTRAVENOUS | Status: AC
  Administered 2020-05-13: 500 mg via INTRAVENOUS

## 2020-05-13 MED ORDER — DIPHENHYDRAMINE HCL 50 MG/ML IJ SOLN: 50.0000 mg | Freq: Once | INTRAMUSCULAR | Status: DC | PRN

## 2020-05-13 MED ORDER — FAMOTIDINE IN NACL 20-0.9 MG/50ML-% IV SOLN: 20.0000 mg | Freq: Once | INTRAVENOUS | Status: DC | PRN

## 2020-05-13 MED ORDER — METHYLPREDNISOLONE SODIUM SUCC 125 MG IJ SOLR: 125.0000 mg | Freq: Once | INTRAMUSCULAR | Status: DC | PRN

## 2020-05-13 MED ORDER — EPINEPHRINE 0.3 MG/0.3ML IJ SOAJ: 0.3000 mg | Freq: Once | INTRAMUSCULAR | Status: DC | PRN

## 2020-05-13 NOTE — Progress Notes (Signed)
I connected by phone with Kenneth Payne on 05/13/2020 at 12:37 PM to discuss the potential use of a new treatment for mild to moderate COVID-19 viral infection in non-hospitalized patients.  This patient is a 59 y.o. male that meets the FDA criteria for Emergency Use Authorization of COVID monoclonal antibody casirivimab/imdevimab, bamlanivimab/eteseviamb, or sotrovimab.  Has a (+) direct SARS-CoV-2 viral test result  Has mild or moderate COVID-19   Is NOT hospitalized due to COVID-19  Is within 10 days of symptom onset  Has at least one of the high risk factor(s) for progression to severe COVID-19 and/or hospitalization as defined in EUA.  Specific high risk criteria : Immunosuppressive Disease or Treatment and Cardiovascular disease or hypertension   I have spoken and communicated the following to the patient or parent/caregiver regarding COVID monoclonal antibody treatment:  1. FDA has authorized the emergency use for the treatment of mild to moderate COVID-19 in adults and pediatric patients with positive results of direct SARS-CoV-2 viral testing who are 66 years of age and older weighing at least 40 kg, and who are at high risk for progressing to severe COVID-19 and/or hospitalization.  2. The significant known and potential risks and benefits of COVID monoclonal antibody, and the extent to which such potential risks and benefits are unknown.  3. Information on available alternative treatments and the risks and benefits of those alternatives, including clinical trials.  4. Patients treated with COVID monoclonal antibody should continue to self-isolate and use infection control measures (e.g., wear mask, isolate, social distance, avoid sharing personal items, clean and disinfect "high touch" surfaces, and frequent handwashing) according to CDC guidelines.   5. The patient or parent/caregiver has the option to accept or refuse COVID monoclonal antibody treatment.  After reviewing  this information with the patient, the patient has agreed to receive one of the available covid 19 monoclonal antibodies and will be provided an appropriate fact sheet prior to infusion.   Kenneth Payne, [place] 05/13/2020 12:37 PM

## 2020-05-13 NOTE — Telephone Encounter (Signed)
Called to discuss with patient about Covid symptoms and the use of the monoclonal antibody infusion for those with mild to moderate Covid symptoms and at a high risk of hospitalization.     Pt appears to qualify for this infusion due to co-morbid conditions and/or a member of an at-risk group in accordance with the FDA Emergency Use Authorization.   Risk factors include: autoimmune arthritis, hypertension, and ulcerative colitis   Symptom onset: 05/11/20  Tested positive for COVID 19: 05/12/20 at [facility]  Discussed information regarding costs of monoclonal antibody treatment, given both CPT & REV codes, and encouraged patient to call their health insurance company to verify cost of treatment that pt will be financially responsible for.  Pre-screened by RN and ready for APP to call and further discuss and/or schedule pt for antibody infusion appt.

## 2020-05-13 NOTE — Discharge Instructions (Signed)

## 2020-05-13 NOTE — Progress Notes (Signed)
Patient reviewed Fact Sheet for Patients, Parents, and Caregivers for Emergency Use Authorization (EUA) of Sotrovimab for the Treatment of Coronavirus. Patient also reviewed and is agreeable to the estimated cost of treatment. Patient is agreeable to proceed.   

## 2020-05-13 NOTE — Progress Notes (Signed)
Diagnosis: COVID-19  Physician: Dr. Asencion Noble  Procedure: Covid Infusion Clinic Med: Sotrovimab infusion - Provided patient with sotrovimab fact sheet for patients, parents, and caregivers prior to infusion.   Complications: No immediate complications noted  Discharge: Discharged home  If after the infusion you have any questions or concerns please call the Advanced Practice Provider at [phone]

## 2020-08-08 ENCOUNTER — Other Ambulatory Visit: Payer: Self-pay | Admitting: *Deleted

## 2020-08-08 DIAGNOSIS — Z79899 Other long term (current) drug therapy: Secondary | ICD-10-CM

## 2020-08-08 DIAGNOSIS — M1990 Unspecified osteoarthritis, unspecified site: Secondary | ICD-10-CM

## 2020-08-09 LAB — COMPLETE METABOLIC PANEL WITH GFR
AG Ratio: 1.5 (calc) (ref 1.0–2.5)
ALT: 23 U/L (ref 9–46)
AST: 22 U/L (ref 10–35)
Albumin: 4.6 g/dL (ref 3.6–5.1)
Alkaline phosphatase (APISO): 78 U/L (ref 35–144)
BUN: 16 mg/dL (ref 7–25)
CO2: 29 mmol/L (ref 20–32)
Calcium: 10 mg/dL (ref 8.6–10.3)
Chloride: 102 mmol/L (ref 98–110)
Creat: 1.09 mg/dL (ref 0.70–1.33)
GFR, Est African American: 86 mL/min/{1.73_m2} (ref >=60)
GFR, Est Non African American: 74 mL/min/{1.73_m2} (ref >=60)
Globulin: 3 g/dL (calc) (ref 1.9–3.7)
Glucose, Bld: 80 mg/dL (ref 65–99)
Potassium: 5 mmol/L (ref 3.5–5.3)
Sodium: 139 mmol/L (ref 135–146)
Total Bilirubin: 0.4 mg/dL (ref 0.2–1.2)
Total Protein: 7.6 g/dL (ref 6.1–8.1)

## 2020-08-09 LAB — CBC WITH DIFFERENTIAL/PLATELET
Absolute Monocytes: 632 cells/uL (ref 200–950)
Basophils Absolute: 31 cells/uL (ref 0–200)
Basophils Relative: 0.4 %
Eosinophils Absolute: 47 cells/uL (ref 15–500)
Eosinophils Relative: 0.6 %
HCT: 42.6 % (ref 38.5–50.0)
Hemoglobin: 14.5 g/dL (ref 13.2–17.1)
Lymphs Abs: 1264 cells/uL (ref 850–3900)
MCH: 33.2 pg — ABNORMAL HIGH (ref 27.0–33.0)
MCHC: 34 g/dL (ref 32.0–36.0)
MCV: 97.5 fL (ref 80.0–100.0)
MPV: 10.7 fL (ref 7.5–12.5)
Monocytes Relative: 8.1 %
Neutro Abs: 5827 cells/uL (ref 1500–7800)
Neutrophils Relative %: 74.7 %
Platelets: 365 10*3/uL (ref 140–400)
RBC: 4.37 10*6/uL (ref 4.20–5.80)
RDW: 13.8 % (ref 11.0–15.0)
Total Lymphocyte: 16.2 %
WBC: 7.8 10*3/uL (ref 3.8–10.8)

## 2020-08-09 LAB — B12 AND FOLATE PANEL
Folate: >24 ng/mL
Vitamin B-12: 473 pg/mL (ref 200–1100)

## 2020-08-09 NOTE — Progress Notes (Signed)
CBC and CMP WNL.  Vitamin B12 and folate WNL.

## 2020-08-24 ENCOUNTER — Other Ambulatory Visit: Payer: Self-pay | Admitting: Rheumatology

## 2020-08-25 NOTE — Telephone Encounter (Signed)
Last Visit: 04/10/2020 Next Visit: 09/09/2020 Labs: 08/08/2020 CBC and CMP WNL.  Current Dose per office note 03/31/2020: Methotrexate 1 mLsq injectionevery 7 days DX: Inflammatory arthritis  Okay to refill per Dr. Estanislado Pandy

## 2020-09-03 ENCOUNTER — Other Ambulatory Visit: Payer: Self-pay | Admitting: Physician Assistant

## 2020-09-05 NOTE — Telephone Encounter (Signed)
Next Visit: 09/26/2020  Last Visit: 03/31/2020  Last Fill: 09/11/2019  Dx:  Inflammatory arthritis  Current Dose per office note on 52/0/7409,  folic acid 1 mg 2 tablets daily  Okay to refill Folic Acid?

## 2020-09-09 ENCOUNTER — Ambulatory Visit: Payer: PRIVATE HEALTH INSURANCE | Admitting: Rheumatology

## 2020-09-12 NOTE — Progress Notes (Signed)
Office Visit Note  Patient: Kenneth Payne             Date of Birth: [birth year]           MRN: [id number]             PCP: Wenda Low, MD Referring: Wenda Low, MD Visit Date: 09/26/2020 Occupation: @GUAROCC @  Subjective:  Right hip pain   History of Present Illness: Kenneth Payne is a 60 y.o. male with history of chronic inflammatory arthritis, UC, and DDD.  He is taking xeljanz 10 mg 1 tablet twice daily, MTX 1.0 ml sq injections once weekly, and folic acid 2 mg by mouth daily.  He denies any signs or symptoms of a flare recently. He continues to have intermittent locking and tenderness of the right middle trigger finger but he does not want an injection at this time.  He has noticed more frequent symptoms since starting to increase his activity level and performing yard work more frequently.  He continues to have intermittent pain and swelling in the right knee joint but it has been tolerable and he declines needing a joint aspiration at this time. He continues to be able to play golf without difficulty. He has been experiencing intermittent discomfort in the right hip for the past 6 months.  According to the patient this discomfort has only occurred when rising from a seated position.  He describes the pain and sharp and stabbing.  He denies any pain on the lateral aspect of his hip or lower back.  He states the pain feels internal and is not radiating into his groin.  He denies any injury or fall prior to the onset of symptoms.  He denies any nocturnal pain.  He has not needed to take any OTC products for pain relief. He denies any other joint pain or joint swelling at this time.  He denies any recent UC flares.  He had a routine colonoscopy on 02/05/20.  He continues to follow up with Dr. Collene Mares.  He was diagnosed with covid-19 at thanksgiving at which time he held MTX and xeljanz until his symptoms completely resolved. He denies any other infections recently.      Activities of Daily  Living:  Patient reports morning stiffness for 0 minutes.   Patient Denies nocturnal pain.  Difficulty dressing/grooming: Denies Difficulty climbing stairs: Denies Difficulty getting out of chair: Denies Difficulty using hands for taps, buttons, cutlery, and/or writing: Denies  Review of Systems  Constitutional: Negative for fatigue.  HENT: Negative for mouth sores, mouth dryness and nose dryness.   Eyes: Negative for pain, itching and dryness.  Respiratory: Negative for shortness of breath and difficulty breathing.   Cardiovascular: Negative for chest pain and palpitations.  Gastrointestinal: Negative for blood in stool, constipation and diarrhea.  Endocrine: Negative for increased urination.  Genitourinary: Negative for difficulty urinating.  Musculoskeletal: Positive for arthralgias, joint pain and joint swelling. Negative for myalgias, morning stiffness, muscle tenderness and myalgias.  Skin: Negative for color change, rash and redness.  Allergic/Immunologic: Negative for susceptible to infections.  Neurological: Negative for dizziness, numbness, headaches, memory loss and weakness.  Hematological: Negative for bruising/bleeding tendency.  Psychiatric/Behavioral: Negative for confusion.    PMFS History:  Patient Active Problem List   Diagnosis Date Noted  . Rheumatoid arthritis ([place])   . High risk medication use 05/25/2016  . Pain, neck 05/25/2016  . Arthralgia of both hands 05/25/2016  . Pain of both sacroiliac joints 05/25/2016  .  Neck mass   . Thrombocytosis 08/20/2013  . Normocytic anemia 08/19/2013  . Ulcerative colitis ([place]) 08/16/2013  . Ulcerative colitis, acute ([place]) 08/16/2013  . History of leukocytosis 08/16/2013    Past Medical History:  Diagnosis Date  . Hypertension   . Rheumatoid arthritis ([place])   . Ulcerative colitis ([place])     Family History  Problem Relation Age of Onset  . Asthma Mother   . Hypertension Mother   . Parkinson's disease Father   .  Hypertension Father   . Stroke Father   . Heart attack Maternal Grandmother   . Heart attack Maternal Grandfather   . Stroke Paternal Grandmother   . Heart attack Paternal Grandfather   . Asthma Daughter    Past Surgical History:  Procedure Laterality Date  . COLONOSCOPY N/A 08/18/2013   Procedure: COLONOSCOPY;  Surgeon: Winfield Cunas., MD;  Location: [facility];  Service: Endoscopy;  Laterality: N/A;  . CYST REMOVAL NECK    . HERNIA REPAIR     2009  . VASECTOMY     Social History   Social History Narrative  . Not on file   Immunization History  Administered Date(s) Administered  . PFIZER(Purple Top)SARS-COV-2 Vaccination 09/04/2019, 09/28/2019  . Zoster Recombinat (Shingrix) 07/10/2018     Objective: Vital Signs: BP 132/85 (BP Location: Left Arm, Patient Position: Sitting, Cuff Size: Normal)   Pulse 86   Resp 16   Ht 5' 7"  (1.702 m)   Wt 188 lb (85.3 kg)   BMI 29.44 kg/m    Physical Exam Vitals and nursing note reviewed.  Constitutional:      Appearance: He is well-developed.  HENT:     Head: Normocephalic and atraumatic.  Eyes:     Conjunctiva/sclera: Conjunctivae normal.     Pupils: Pupils are equal, round, and reactive to light.  Pulmonary:     Effort: Pulmonary effort is normal.  Abdominal:     Palpations: Abdomen is soft.  Musculoskeletal:     Cervical back: Normal range of motion and neck supple.  Skin:    General: Skin is warm and dry.     Capillary Refill: Capillary refill takes less than 2 seconds.  Neurological:     Mental Status: He is alert and oriented to person, place, and time.  Psychiatric:        Behavior: Behavior normal.      Musculoskeletal Exam: C-spine limited ROM.  Thoracic and lumbar spine good ROM.  No midline spinal tenderness.  No SI joint tenderness.  Shoulder joints, elbow joints, wrist joints, MCPs, PIPs, and DIPs good ROM with no synovitis.  PIP and DIP thickening consistent with ostearthritis of both hands.  Right  middle trigger finger.  Complete fist formation bilaterally.  Hip joints good ROM with no discomfort.  No tenderness over trochanteric bursa bilaterally.  Knee joints good ROM with no discomfort.  No warmth or effusion of the knee joints at this time.  Ankle joints good ROM with no tenderness or swelling.   CDAI Exam: CDAI Score: -- Patient Global: --; Provider Global: -- Swollen: --; Tender: -- Joint Exam 09/26/2020   No joint exam has been documented for this visit   There is currently no information documented on the homunculus. Go to the Rheumatology activity and complete the homunculus joint exam.  Investigation: No additional findings.  Imaging: No results found.  Recent Labs: Lab Results  Component Value Date   WBC 7.8 08/08/2020   HGB 14.5 08/08/2020  PLT 365 08/08/2020   NA 139 08/08/2020   K 5.0 08/08/2020   CL 102 08/08/2020   CO2 29 08/08/2020   GLUCOSE 80 08/08/2020   BUN 16 08/08/2020   CREATININE 1.09 08/08/2020   BILITOT 0.4 08/08/2020   ALKPHOS 67 06/08/2019   AST 22 08/08/2020   ALT 23 08/08/2020   PROT 7.6 08/08/2020   ALBUMIN 4.9 06/08/2019   CALCIUM 10.0 08/08/2020   GFRAA 86 08/08/2020   QFTBGOLDPLUS NEGATIVE 08/25/2019    Speciality Comments: No specialty comments available.  Procedures:  No procedures performed Allergies: Augmentin [amoxicillin-pot clavulanate] and Flagyl [metronidazole]    Assessment / Plan:     Visit Diagnoses: Inflammatory arthritis: He has no joint tenderness or synovitis on exam.  He has not had any recent flares.  He has clinically been doing well on Xeljanz 1 tablet by mouth twice daily, methotrexate 1 mL sq injections once weekly, and folic acid 2 mg by mouth daily.  He continues to experience mild intermittent pain and mild joint effusions in the right knee joint which is typically exacerbated by physical activity.  On exam today he has good range of motion of the right knee joint with no discomfort, warmth, or  effusion.  He has been experiencing intermittent pain in the right hip when rising from a seated to standing position.  He describes the pain as fleeting and sharp.  On examination today he has good range of motion of the right hip pain.  His discomfort is not reproducible while he is in the office today.  He declined updated x-rays at this time.  He had no SI joint discomfort or tenderness on exam.  He is not experiencing other joint pain or inflammation at this time.  He has not had any recent ulcerative colitis flares.  He had a routine colonoscopy on 02/05/2020 and continues to follow-up with Dr. Collene Mares as recommended.  We will continue on the current treatment regimen.  He was advised to notify us if he develops any new or worsening symptoms.  He will follow-up in the office in 5 months.    High risk medication use - Xeljanz 10 mg 1 tablet by mouth twice daily-Prescribed by Dr. Collene Mares, Methotrexate 1 mL sq injection every 7 days, and folic acid 1 mg 2 tablets daily.  CBC and CMP updated on 08/08/20.  He will be due to update CBC and CMP and in May and every 3 months to monitor for drug toxicity.  Standing orders remain in place. TB gold negative on 08/25/19.  He is overdue to update TB gold.  Future order for TB gold and lipid panel placed today.    - Plan: QuantiFERON-TB Gold Plus, Lipid panel Discussed the FDA black box warning for the potential to cause increased MACE and malignancy in patients taking Jak inhibitors.Discussed the importance of modifying risk factors.  Need to monitor blood pressure, cholesterol, and to exercise 30-60 minutes on daily basis was discussed.  Lipid panel order placed today.  He was diagnosed with COVID-19 in November 2021.  He held [place] and methotrexate during that time.  We discussed the importance of holding Xeljanz and methotrexate if he develops signs or symptoms of an infection and to resume once infection has completely cleared.  He voiced understanding.  Screening for  tuberculosis -Future order for TB gold placed today. Plan: QuantiFERON-TB Gold Plus  Pain in right hip: He has been experiencing intermittent, fleeting pain in the right hip for the past 6  months.  He did not have any injury or fall prior to the onset of symptoms.  He experiences an occasional sharp, stabbing pain in the right hip when rising from a seated to standing position. Discomfort was not reproducible today in the office.  He has not had any discomfort with activity.  His symptoms are typically exacerbated after sitting for prolonged periods of time while riding in the car.  He has no SI joint tenderness or tenderness over the right trochanteric bursa on exam.  He has good ROM of the right hip on exam today with no discomfort. He declined updated x-rays of the right hip today.  He was advised to notify us if his discomfort persists or worsens.    Other ulcerative colitis with other complication [facility]): He has not had any signs or symptoms of an ulcerative colitis flare recently.  He is continues to follow-up with Dr. Collene Mares on a routine basis.  He had a routine colonoscopy on 02/05/2020. He will continue to undergo surveillance colonoscopies every 2 years.    Effusion of right knee: He has good ROM of the right knee joint on exam with no discomfort.  No warmth or effusion noted at this time.  He has been able to golf without difficulty.  He was advised to notify us if he develops any new or worsening symptoms.  Trigger middle finger of right hand: He has intermittent tenderness and locking which is typically exacerbated by overuse activities and yard work.  Cortisone injection at this time.  We discussed the use of buddy tape to the adjacent finger vs. Using a splint.   DDD (degenerative disc disease), cervical: He has limited ROM with lateral rotation bilaterally.  He is not experiencing any increased discomfort or stiffness in his neck at this time.    Iron deficiency: H/o   Normocytic anemia:  RBC count, hgb, and hct WNL on 08/09/20.   Thrombocytosis: Platelet count WNL on 08/08/20.      Orders: Orders Placed This Encounter  Procedures  . QuantiFERON-TB Gold Plus  . Lipid panel   No orders of the defined types were placed in this encounter.    Follow-Up Instructions: Return in about 5 months (around 02/26/2021) for Inflammatory arthritis, UC.   Ofilia Neas, PA-C  Note - This record has been created using Dragon software.  Chart creation errors have been sought, but may not always  have been located. Such creation errors do not reflect on  the standard of medical care.

## 2020-09-26 ENCOUNTER — Other Ambulatory Visit: Payer: Self-pay

## 2020-09-26 ENCOUNTER — Encounter: Payer: Self-pay | Admitting: Physician Assistant

## 2020-09-26 ENCOUNTER — Ambulatory Visit (INDEPENDENT_AMBULATORY_CARE_PROVIDER_SITE_OTHER): Payer: PRIVATE HEALTH INSURANCE | Admitting: Physician Assistant

## 2020-09-26 VITALS — BP 132/85 | HR 86 | Resp 16 | Ht 67.0 in | Wt 188.0 lb

## 2020-09-26 DIAGNOSIS — Z79899 Other long term (current) drug therapy: Secondary | ICD-10-CM

## 2020-09-26 DIAGNOSIS — K51818 Other ulcerative colitis with other complication: Secondary | ICD-10-CM | POA: Diagnosis not present

## 2020-09-26 DIAGNOSIS — D649 Anemia, unspecified: Secondary | ICD-10-CM

## 2020-09-26 DIAGNOSIS — M25461 Effusion, right knee: Secondary | ICD-10-CM

## 2020-09-26 DIAGNOSIS — E611 Iron deficiency: Secondary | ICD-10-CM

## 2020-09-26 DIAGNOSIS — M1990 Unspecified osteoarthritis, unspecified site: Secondary | ICD-10-CM

## 2020-09-26 DIAGNOSIS — M5030 Other cervical disc degeneration, unspecified cervical region: Secondary | ICD-10-CM

## 2020-09-26 DIAGNOSIS — M25551 Pain in right hip: Secondary | ICD-10-CM

## 2020-09-26 DIAGNOSIS — Z111 Encounter for screening for respiratory tuberculosis: Secondary | ICD-10-CM

## 2020-09-26 DIAGNOSIS — D75839 Thrombocytosis, unspecified: Secondary | ICD-10-CM

## 2020-09-26 DIAGNOSIS — M65331 Trigger finger, right middle finger: Secondary | ICD-10-CM

## 2020-09-26 NOTE — Patient Instructions (Addendum)
Standing Labs We placed an order today for your standing lab work.   Please have your standing labs drawn in May and every 3 months  TB gold is due   If possible, please have your labs drawn 2 weeks prior to your appointment so that the provider can discuss your results at your appointment.  We have open lab daily Monday through Thursday from 1:30-4:30 PM and Friday from 1:30-4:00 PM at the office of Dr. Bo Merino, [facility].   Please be advised, all patients with office appointments requiring lab work will take precedents over walk-in lab work.  If possible, please come for your lab work on Monday and Friday afternoons, as you may experience shorter wait times. The office is located at [address] [place], [place], [place] [address] No appointment is necessary.   Labs are drawn by [facility]. Please bring your co-pay at the time of your lab draw.  You may receive a bill from [facility] for your lab work.  If you wish to have your labs drawn at another location, please call the office 24 hours in advance to send orders.  If you have any questions regarding directions or hours of operation,  please call [phone].   As a reminder, please drink plenty of water prior to coming for your lab work. Thanks!

## 2020-09-27 ENCOUNTER — Other Ambulatory Visit: Payer: Self-pay | Admitting: *Deleted

## 2020-09-27 DIAGNOSIS — Z111 Encounter for screening for respiratory tuberculosis: Secondary | ICD-10-CM

## 2020-09-27 DIAGNOSIS — Z79899 Other long term (current) drug therapy: Secondary | ICD-10-CM

## 2020-09-28 NOTE — Progress Notes (Signed)
LDL is slightly elevated-113 and total cholesterol is borderline elevated-103.  HDL is WNL.

## 2020-09-29 LAB — LIPID PANEL
Cholesterol: 203 mg/dL — ABNORMAL HIGH (ref <200)
HDL: 65 mg/dL (ref >=40)
LDL Cholesterol (Calc): 113 mg/dL (calc) — ABNORMAL HIGH
Non-HDL Cholesterol (Calc): 138 mg/dL (calc) — ABNORMAL HIGH (ref <130)
Total CHOL/HDL Ratio: 3.1 (calc) (ref <5.0)
Triglycerides: 141 mg/dL (ref <150)

## 2020-09-29 LAB — QUANTIFERON-TB GOLD PLUS
Mitogen-NIL: 1.45 IU/mL
NIL: 0.03 IU/mL
QuantiFERON-TB Gold Plus: NEGATIVE
TB1-NIL: 0 IU/mL
TB2-NIL: 0 IU/mL

## 2020-09-29 NOTE — Progress Notes (Signed)
TB gold negative

## 2020-10-04 ENCOUNTER — Other Ambulatory Visit: Payer: Self-pay | Admitting: Rheumatology

## 2020-10-04 NOTE — Telephone Encounter (Signed)
Next Visit: 03/08/2021  Last Visit: 09/26/2020  Last Fill: 08/20/2019  Dx:  Inflammatory arthritis  Current Dose per office note on 09/26/2020, sq injections once weekly  Okay to refill Syringes?

## 2020-11-22 ENCOUNTER — Other Ambulatory Visit: Payer: Self-pay | Admitting: Rheumatology

## 2020-11-23 ENCOUNTER — Other Ambulatory Visit: Payer: Self-pay

## 2020-11-23 DIAGNOSIS — Z79899 Other long term (current) drug therapy: Secondary | ICD-10-CM

## 2020-11-23 NOTE — Telephone Encounter (Signed)
Next Visit: 03/08/2021  Last Visit: 09/26/2020  Last Fill: 08/25/2020  Dx: Inflammatory arthritis  Labs: 08/08/2020 CBC and CMP WNL.  Patient advised he is due to update labs. Patient states he will come into the office to update them today.   Okay to refill MTX?

## 2020-11-24 LAB — CBC WITH DIFFERENTIAL/PLATELET
Absolute Monocytes: 542 cells/uL (ref 200–950)
Basophils Absolute: 26 cells/uL (ref 0–200)
Basophils Relative: 0.3 %
Eosinophils Absolute: 52 cells/uL (ref 15–500)
Eosinophils Relative: 0.6 %
HCT: 40.4 % (ref 38.5–50.0)
Hemoglobin: 13.6 g/dL (ref 13.2–17.1)
Lymphs Abs: 860 cells/uL (ref 850–3900)
MCH: 33.3 pg — ABNORMAL HIGH (ref 27.0–33.0)
MCHC: 33.7 g/dL (ref 32.0–36.0)
MCV: 98.8 fL (ref 80.0–100.0)
MPV: 10.3 fL (ref 7.5–12.5)
Monocytes Relative: 6.3 %
Neutro Abs: 7121 cells/uL (ref 1500–7800)
Neutrophils Relative %: 82.8 %
Platelets: 379 10*3/uL (ref 140–400)
RBC: 4.09 10*6/uL — ABNORMAL LOW (ref 4.20–5.80)
RDW: 14.7 % (ref 11.0–15.0)
Total Lymphocyte: 10 %
WBC: 8.6 10*3/uL (ref 3.8–10.8)

## 2020-11-24 LAB — COMPLETE METABOLIC PANEL WITH GFR
AG Ratio: 2 (calc) (ref 1.0–2.5)
ALT: 22 U/L (ref 9–46)
AST: 19 U/L (ref 10–35)
Albumin: 4.7 g/dL (ref 3.6–5.1)
Alkaline phosphatase (APISO): 67 U/L (ref 35–144)
BUN: 14 mg/dL (ref 7–25)
CO2: 30 mmol/L (ref 20–32)
Calcium: 9.5 mg/dL (ref 8.6–10.3)
Chloride: 98 mmol/L (ref 98–110)
Creat: 1.31 mg/dL (ref 0.70–1.33)
GFR, Est African American: 69 mL/min/{1.73_m2} (ref >=60)
GFR, Est Non African American: 60 mL/min/{1.73_m2} (ref >=60)
Globulin: 2.3 g/dL (calc) (ref 1.9–3.7)
Glucose, Bld: 142 mg/dL — ABNORMAL HIGH (ref 65–99)
Potassium: 4 mmol/L (ref 3.5–5.3)
Sodium: 138 mmol/L (ref 135–146)
Total Bilirubin: 0.6 mg/dL (ref 0.2–1.2)
Total Protein: 7 g/dL (ref 6.1–8.1)

## 2020-11-24 NOTE — Progress Notes (Signed)
Glucose is elevated-142.  Rest of CMP WNL.  RBC count is borderline low.  Hemoglobin and hematocrit are WNL.  We will continue to monitor.  No medication changes at this time.

## 2021-02-22 NOTE — Progress Notes (Signed)
Office Visit Note  Patient: Kenneth Payne             Date of Birth: [birth year]           MRN: [id number]             PCP: Wenda Low, MD Referring: Wenda Low, MD Visit Date: 03/08/2021 Occupation: @GUAROCC @  Subjective:  Medication management   History of Present Illness: Kenneth Payne is a 60 y.o. male with history of ulcerative colitis and inflammatory arthritis.  He states he has been doing well on the combination of methotrexate and Xeljanz.  He has not had any flares of right knee joint inflammation since March 2021.  He has occasional discomfort in his right knee joint.  He states his trigger finger bothers him off and on but he is not ready to have cortisone injection yet.  His ulcerative colitis is fairly controlled on higher dose of Xeljanz.  Activities of Daily Living:  Patient reports morning stiffness for 0 minutes.   Patient Denies nocturnal pain.  Difficulty dressing/grooming: Denies Difficulty climbing stairs: Denies Difficulty getting out of chair: Denies Difficulty using hands for taps, buttons, cutlery, and/or writing: Denies  Review of Systems  Constitutional:  Negative for fatigue and night sweats.  HENT:  Negative for mouth sores, mouth dryness and nose dryness.   Eyes:  Negative for pain, redness, itching and dryness.  Respiratory:  Negative for shortness of breath and difficulty breathing.   Cardiovascular:  Negative for chest pain, palpitations, hypertension, irregular heartbeat and swelling in legs/feet.  Gastrointestinal:  Negative for blood in stool, constipation and diarrhea.  Endocrine: Negative for increased urination.  Genitourinary:  Negative for difficulty urinating.  Musculoskeletal:  Negative for joint pain, joint pain, joint swelling, myalgias, muscle weakness, morning stiffness, muscle tenderness and myalgias.  Skin:  Negative for color change, rash, hair loss, nodules/bumps, redness, skin tightness, ulcers and sensitivity to sunlight.   Allergic/Immunologic: Negative for susceptible to infections.  Neurological:  Negative for dizziness, fainting, numbness, headaches, memory loss, night sweats and weakness.  Hematological:  Negative for bruising/bleeding tendency and swollen glands.  Psychiatric/Behavioral:  Negative for depressed mood, confusion and sleep disturbance. The patient is not nervous/anxious.    PMFS History:  Patient Active Problem List   Diagnosis Date Noted   Rheumatoid arthritis ([place])    High risk medication use 05/25/2016   Pain, neck 05/25/2016   Arthralgia of both hands 05/25/2016   Pain of both sacroiliac joints 05/25/2016   Neck mass    Thrombocytosis 08/20/2013   Normocytic anemia 08/19/2013   Ulcerative colitis ([place]) 08/16/2013   Ulcerative colitis, acute ([place]) 08/16/2013   History of leukocytosis 08/16/2013    Past Medical History:  Diagnosis Date   Hypertension    Rheumatoid arthritis ([place])    Ulcerative colitis ([place])     Family History  Problem Relation Age of Onset   Asthma Mother    Hypertension Mother    Parkinson's disease Father    Hypertension Father    Stroke Father    Heart attack Maternal Grandmother    Heart attack Maternal Grandfather    Stroke Paternal Grandmother    Heart attack Paternal Grandfather    Asthma Daughter    Past Surgical History:  Procedure Laterality Date   COLONOSCOPY N/A 08/18/2013   Procedure: COLONOSCOPY;  Surgeon: Winfield Cunas., MD;  Location: [facility];  Service: Endoscopy;  Laterality: N/A;   CYST REMOVAL NECK     HERNIA  REPAIR     2009   VASECTOMY     Social History   Social History Narrative   Not on file   Immunization History  Administered Date(s) Administered   PFIZER(Purple Top)SARS-COV-2 Vaccination 09/04/2019, 09/28/2019   Zoster Recombinat (Shingrix) 07/10/2018     Objective: Vital Signs: BP 128/85 (BP Location: Left Arm, Patient Position: Sitting, Cuff Size: Normal)   Pulse 76   Ht 5' 7"  (1.702 m)   Wt 179  lb (81.2 kg)   BMI 28.04 kg/m    Physical Exam Vitals and nursing note reviewed.  Constitutional:      Appearance: He is well-developed.  HENT:     Head: Normocephalic and atraumatic.  Eyes:     Conjunctiva/sclera: Conjunctivae normal.     Pupils: Pupils are equal, round, and reactive to light.  Cardiovascular:     Rate and Rhythm: Normal rate and regular rhythm.     Heart sounds: Normal heart sounds.  Pulmonary:     Effort: Pulmonary effort is normal.     Breath sounds: Normal breath sounds.  Abdominal:     General: Bowel sounds are normal.     Palpations: Abdomen is soft.  Musculoskeletal:     Cervical back: Normal range of motion and neck supple.  Skin:    General: Skin is warm and dry.     Capillary Refill: Capillary refill takes less than 2 seconds.  Neurological:     Mental Status: He is alert and oriented to person, place, and time.  Psychiatric:        Behavior: Behavior normal.     Musculoskeletal Exam: C-spine was in good range of motion.  Shoulder joints, elbow joints, wrist joints, MCPs PIPs and DIPs with good range of motion with no synovitis.  He had right trigger finger.  Hip joints and knee joints in good range of motion.  There was no tenderness over ankles or MTPs.  The effusion in his right knee joint is resolved.  CDAI Exam: CDAI Score: -- Patient Global: --; Provider Global: -- Swollen: --; Tender: -- Joint Exam 03/08/2021   No joint exam has been documented for this visit   There is currently no information documented on the homunculus. Go to the Rheumatology activity and complete the homunculus joint exam.  Investigation: No additional findings.  Imaging: No results found.  Recent Labs: Lab Results  Component Value Date   WBC 9.6 03/01/2021   HGB 15.0 03/01/2021   PLT 435 (H) 03/01/2021   NA 137 03/01/2021   K 4.6 03/01/2021   CL 99 03/01/2021   CO2 29 03/01/2021   GLUCOSE 91 03/01/2021   BUN 14 03/01/2021   CREATININE 1.23  03/01/2021   BILITOT 0.4 03/01/2021   ALKPHOS 67 06/08/2019   AST 25 03/01/2021   ALT 28 03/01/2021   PROT 7.4 03/01/2021   ALBUMIN 4.9 06/08/2019   CALCIUM 9.7 03/01/2021   GFRAA 69 11/23/2020   QFTBGOLDPLUS NEGATIVE 09/27/2020    Speciality Comments: No specialty comments available.  Procedures:  No procedures performed Allergies: Augmentin [amoxicillin-pot clavulanate] and Flagyl [metronidazole]   Assessment / Plan:     Visit Diagnoses: Inflammatory arthritis-he is doing well on the combination of Xeljanz and methotrexate.  He had no synovitis on examination.  We discussed tapering methotrexate to 0.8 mL subcu weekly after March 2023 that will be 2 years from the last flare.  He was in agreement.  High risk medication use - Xeljanz 10 mg 1 tablet by  mouth twice daily-Prescribed by Dr. Collene Mares, Methotrexate 1 mL sq injection every 7 days, and folic acid 1 mg 2 tablets daily.  Labs obtained on March 01, 2021 were unremarkable except for mild thrombocytosis with platelets of 435.  TB gold was negative on September 27, 2020.  Updated information regarding immunization was placed in the AVS.  He was advised to stop Xeljanz and methotrexate in case he develops an infection and resume after infection resolves.  Side effects of Xeljanz and methotrexate were reviewed per patient's request.  Pain in right hip-resolved.  Other ulcerative colitis with other complication (HCC)-UC is quiet for patient and he is well controlled on the current combination.  Effusion of right knee-resolved.  He had no warmth swelling or effusion.  Trigger middle finger of right hand-history of symptoms of trigger finger.  Offered cortisone injection.  He will contact us when he is ready.  DDD (degenerative disc disease), cervical-he continues to have some stiffness.  Iron deficiency -his hemoglobin is normal now.  Thrombocytosis-platelet count is mildly elevated we will continue to monitor.   Orders: No orders of  the defined types were placed in this encounter.  No orders of the defined types were placed in this encounter.   Follow-Up Instructions: Return in about 5 months (around 08/08/2021) for UC, inflmmatory arthritis.   Bo Merino, MD  Note - This record has been created using Editor, commissioning.  Chart creation errors have been sought, but may not always  have been located. Such creation errors do not reflect on  the standard of medical care.

## 2021-03-01 ENCOUNTER — Other Ambulatory Visit: Payer: Self-pay | Admitting: *Deleted

## 2021-03-01 DIAGNOSIS — Z79899 Other long term (current) drug therapy: Secondary | ICD-10-CM

## 2021-03-02 LAB — COMPLETE METABOLIC PANEL WITH GFR
AG Ratio: 1.6 (calc) (ref 1.0–2.5)
ALT: 28 U/L (ref 9–46)
AST: 25 U/L (ref 10–35)
Albumin: 4.6 g/dL (ref 3.6–5.1)
Alkaline phosphatase (APISO): 78 U/L (ref 35–144)
BUN: 14 mg/dL (ref 7–25)
CO2: 29 mmol/L (ref 20–32)
Calcium: 9.7 mg/dL (ref 8.6–10.3)
Chloride: 99 mmol/L (ref 98–110)
Creat: 1.23 mg/dL (ref 0.70–1.30)
Globulin: 2.8 g/dL (calc) (ref 1.9–3.7)
Glucose, Bld: 91 mg/dL (ref 65–99)
Potassium: 4.6 mmol/L (ref 3.5–5.3)
Sodium: 137 mmol/L (ref 135–146)
Total Bilirubin: 0.4 mg/dL (ref 0.2–1.2)
Total Protein: 7.4 g/dL (ref 6.1–8.1)
eGFR: 68 mL/min/{1.73_m2} (ref >=60)

## 2021-03-02 LAB — CBC WITH DIFFERENTIAL/PLATELET
Absolute Monocytes: 662 cells/uL (ref 200–950)
Basophils Absolute: 67 cells/uL (ref 0–200)
Basophils Relative: 0.7 %
Eosinophils Absolute: 86 cells/uL (ref 15–500)
Eosinophils Relative: 0.9 %
HCT: 46.4 % (ref 38.5–50.0)
Hemoglobin: 15 g/dL (ref 13.2–17.1)
Lymphs Abs: 1565 cells/uL (ref 850–3900)
MCH: 31.6 pg (ref 27.0–33.0)
MCHC: 32.3 g/dL (ref 32.0–36.0)
MCV: 97.7 fL (ref 80.0–100.0)
MPV: 10.2 fL (ref 7.5–12.5)
Monocytes Relative: 6.9 %
Neutro Abs: 7219 cells/uL (ref 1500–7800)
Neutrophils Relative %: 75.2 %
Platelets: 435 10*3/uL — ABNORMAL HIGH (ref 140–400)
RBC: 4.75 10*6/uL (ref 4.20–5.80)
RDW: 14.6 % (ref 11.0–15.0)
Total Lymphocyte: 16.3 %
WBC: 9.6 10*3/uL (ref 3.8–10.8)

## 2021-03-02 NOTE — Progress Notes (Signed)
Platelet count is slightly elevated. Rest of CBC WNL.  CMP WNL.

## 2021-03-08 ENCOUNTER — Encounter: Payer: Self-pay | Admitting: Rheumatology

## 2021-03-08 ENCOUNTER — Other Ambulatory Visit: Payer: Self-pay

## 2021-03-08 ENCOUNTER — Ambulatory Visit (INDEPENDENT_AMBULATORY_CARE_PROVIDER_SITE_OTHER): Payer: PRIVATE HEALTH INSURANCE | Admitting: Rheumatology

## 2021-03-08 VITALS — BP 128/85 | HR 76 | Ht 67.0 in | Wt 179.0 lb

## 2021-03-08 DIAGNOSIS — M25551 Pain in right hip: Secondary | ICD-10-CM

## 2021-03-08 DIAGNOSIS — M1990 Unspecified osteoarthritis, unspecified site: Secondary | ICD-10-CM

## 2021-03-08 DIAGNOSIS — M25461 Effusion, right knee: Secondary | ICD-10-CM

## 2021-03-08 DIAGNOSIS — K51818 Other ulcerative colitis with other complication: Secondary | ICD-10-CM | POA: Diagnosis not present

## 2021-03-08 DIAGNOSIS — M65331 Trigger finger, right middle finger: Secondary | ICD-10-CM

## 2021-03-08 DIAGNOSIS — D649 Anemia, unspecified: Secondary | ICD-10-CM

## 2021-03-08 DIAGNOSIS — E611 Iron deficiency: Secondary | ICD-10-CM

## 2021-03-08 DIAGNOSIS — Z79899 Other long term (current) drug therapy: Secondary | ICD-10-CM | POA: Diagnosis not present

## 2021-03-08 DIAGNOSIS — D75839 Thrombocytosis, unspecified: Secondary | ICD-10-CM

## 2021-03-08 DIAGNOSIS — M5030 Other cervical disc degeneration, unspecified cervical region: Secondary | ICD-10-CM

## 2021-03-08 NOTE — Patient Instructions (Signed)
Standing Labs We placed an order today for your standing lab work.   Please have your standing labs drawn in December and every 3 months  If possible, please have your labs drawn 2 weeks prior to your appointment so that the provider can discuss your results at your appointment.  Please note that you may see your imaging and lab results in [place] before we have reviewed them. We may be awaiting multiple results to interpret others before contacting you. Please allow our office up to 72 hours to thoroughly review all of the results before contacting the office for clarification of your results.  We have open lab daily: Monday through Thursday from 1:30-4:30 PM and Friday from 1:30-4:00 PM at the office of Dr. Bo Merino, [facility].   Please be advised, all patients with office appointments requiring lab work will take precedent over walk-in lab work.  If possible, please come for your lab work on Monday and Friday afternoons, as you may experience shorter wait times. The office is located at [address] [place], [place], [place] [address] No appointment is necessary.   Labs are drawn by [facility]. Please bring your co-pay at the time of your lab draw.  You may receive a bill from [place] for your lab work.  If you wish to have your labs drawn at another location, please call the office 24 hours in advance to send orders.  If you have any questions regarding directions or hours of operation,  please call [phone].   As a reminder, please drink plenty of water prior to coming for your lab work. Thanks!   Vaccines You are taking a medication(s) that can suppress your immune system.  The following immunizations are recommended: Flu annually Covid-19  Td/Tdap (tetanus, diphtheria, pertussis) every 10 years Pneumonia (Prevnar 15 then Pneumovax 23 at least 1 year apart.  Alternatively, can take Prevnar 20 without needing additional dose) Shingrix: 2 doses from 4  weeks to 6 months apart  Please check with your PCP to make sure you are up to date.   If you test POSITIVE for COVID19 and have MILD to MODERATE symptoms: First, call your PCP if you would like to receive COVID19 treatment AND Hold your medications during the infection and for at least 1 week after your symptoms have resolved: Injectable medication (Benlysta, Cimzia, Cosentyx, Enbrel, Humira, Orencia, Remicade, Simponi, Stelara, Taltz, Tremfya) Methotrexate Leflunomide (Arava) Azathioprine Mycophenolate (Cellcept) Roma Kayser, or Rinvoq Otezla If you take Actemra or Kevzara, you DO NOT need to hold these for COVID19 infection.  If you test POSITIVE for COVID19 and have NO symptoms: First, call your PCP if you would like to receive COVID19 treatment AND Hold your medications for at least 10 days after the day that you tested positive Injectable medication (Benlysta, Cimzia, Cosentyx, Enbrel, Humira, Orencia, Remicade, Simponi, Stelara, Taltz, Tremfya) Methotrexate Leflunomide (Arava) Azathioprine Mycophenolate (Cellcept) Roma Kayser, or Rinvoq Otezla If you take Actemra or Kevzara, you DO NOT need to hold these for COVID19 infection.  If you have signs or symptoms of an infection or start antibiotics: First, call your PCP for workup of your infection. Hold your medication through the infection, until you complete your antibiotics, and until symptoms resolve if you take the following: Injectable medication (Actemra, Benlysta, Cimzia, Cosentyx, Enbrel, Humira, Kevzara, Orencia, Remicade, Simponi, Stelara, Taltz, Tremfya) Methotrexate Leflunomide (Arava) Mycophenolate (Cellcept) Roma Kayser, or Rinvoq  Heart Disease Prevention   Your inflammatory disease increases your risk of heart disease which  includes heart attack, stroke, atrial fibrillation (irregular heartbeats), high blood pressure, heart failure and atherosclerosis (plaque in the arteries).  It is  important to reduce your risk by:   Keep blood pressure, cholesterol, and blood sugar at healthy levels   Smoking Cessation   Maintain a healthy weight  BMI 20-25   Eat a healthy diet  Plenty of fresh fruit, vegetables, and whole grains  Limit saturated fats, foods high in sodium, and added sugars  DASH and Mediterranean diet   Increase physical activity  Recommend moderate physically activity for 150 minutes per week/ 30 minutes a day for five days a week These can be broken up into three separate ten-minute sessions during the day.   Reduce Stress  Meditation, slow breathing exercises, yoga, coloring books  Dental visits twice a year

## 2021-03-26 ENCOUNTER — Other Ambulatory Visit: Payer: Self-pay | Admitting: Rheumatology

## 2021-03-27 NOTE — Telephone Encounter (Signed)
Next Visit: 08/11/2021  Last Visit: 03/08/2021  Last Fill: 11/23/2020  DX:  Inflammatory arthritis  Current Dose per office note 03/08/2021: Methotrexate 1 mL sq injection every 7 days  Labs: 03/01/2021 Platelet count is slightly elevated. Rest of CBC WNL.  CMP WNL.   Okay to refill MTX?

## 2021-06-08 ENCOUNTER — Other Ambulatory Visit: Payer: Self-pay

## 2021-06-08 ENCOUNTER — Encounter: Payer: Self-pay | Admitting: Physician Assistant

## 2021-06-08 ENCOUNTER — Ambulatory Visit: Payer: Self-pay

## 2021-06-08 ENCOUNTER — Ambulatory Visit (INDEPENDENT_AMBULATORY_CARE_PROVIDER_SITE_OTHER): Payer: PRIVATE HEALTH INSURANCE | Admitting: Physician Assistant

## 2021-06-08 VITALS — BP 131/84 | HR 89 | Ht 67.0 in | Wt 188.0 lb

## 2021-06-08 DIAGNOSIS — D75839 Thrombocytosis, unspecified: Secondary | ICD-10-CM

## 2021-06-08 DIAGNOSIS — K51818 Other ulcerative colitis with other complication: Secondary | ICD-10-CM | POA: Diagnosis not present

## 2021-06-08 DIAGNOSIS — E611 Iron deficiency: Secondary | ICD-10-CM

## 2021-06-08 DIAGNOSIS — M25561 Pain in right knee: Secondary | ICD-10-CM

## 2021-06-08 DIAGNOSIS — M1990 Unspecified osteoarthritis, unspecified site: Secondary | ICD-10-CM | POA: Diagnosis not present

## 2021-06-08 DIAGNOSIS — M65331 Trigger finger, right middle finger: Secondary | ICD-10-CM

## 2021-06-08 DIAGNOSIS — Z79899 Other long term (current) drug therapy: Secondary | ICD-10-CM | POA: Diagnosis not present

## 2021-06-08 DIAGNOSIS — G8929 Other chronic pain: Secondary | ICD-10-CM

## 2021-06-08 DIAGNOSIS — D649 Anemia, unspecified: Secondary | ICD-10-CM

## 2021-06-08 DIAGNOSIS — M5030 Other cervical disc degeneration, unspecified cervical region: Secondary | ICD-10-CM

## 2021-06-08 DIAGNOSIS — M25461 Effusion, right knee: Secondary | ICD-10-CM | POA: Diagnosis not present

## 2021-06-08 NOTE — Patient Instructions (Signed)
Standing Labs We placed an order today for your standing lab work.   Please have your standing labs drawn in December/ January and every 3 months   If possible, please have your labs drawn 2 weeks prior to your appointment so that the provider can discuss your results at your appointment.  Please note that you may see your imaging and lab results in [place] before we have reviewed them. We may be awaiting multiple results to interpret others before contacting you. Please allow our office up to 72 hours to thoroughly review all of the results before contacting the office for clarification of your results.  We have open lab daily: Monday through Thursday from 1:30-4:30 PM and Friday from 1:30-4:00 PM at the office of Dr. Bo Merino, [facility].   Please be advised, all patients with office appointments requiring lab work will take precedent over walk-in lab work.  If possible, please come for your lab work on Monday and Friday afternoons, as you may experience shorter wait times. The office is located at [address] [place], [place], [place] [address] No appointment is necessary.   Labs are drawn by [facility]. Please bring your co-pay at the time of your lab draw.  You may receive a bill from [facility] for your lab work.  If you wish to have your labs drawn at another location, please call the office 24 hours in advance to send orders.  If you have any questions regarding directions or hours of operation,  please call [phone].   As a reminder, please drink plenty of water prior to coming for your lab work. Thanks!

## 2021-06-08 NOTE — Progress Notes (Signed)
Office Visit Note  Patient: Kenneth Payne             Date of Birth: 1961-05-13           MRN: [id number]             PCP: Wenda Low, MD Referring: Wenda Low, MD Visit Date: 06/08/2021 Occupation: @GUAROCC @  Subjective:  Right knee joint effusion  History of Present Illness: Kenneth Payne is a 60 y.o. male with history of inflammatory arthritis and UC.  He is taking Xeljanz 10 mg 1 tablet by mouth twice daily-Prescribed by Dr. Collene Mares, Methotrexate 1 mL sq injection every 7 days, and folic acid 1 mg 2 tablets daily.  He is tolerating these medications without any side effects.  He has been experiencing increased pain and stiffness in the right knee for the past 3 weeks.  According to the patient he was in a restaurant and the floors were wet and he felt like he "jammed the right knee" when trying to brace himself."  He has not noticed any obvious joint swelling but it feels tight.  He denies any mechanical symptoms at this time.  He denies any discomfort in the left knee.  He is not having any other joint pain or joint swelling at this time.  He denies any SI joint discomfort or stiffness.  Denies any achilles tendonitis or plantar fasciitis.  His right middle finger continues to lock up intermittently but he is not ready for an injection.  He denies any recent UC flares.  He had a routine follow up with Dr. Collene Mares a few months ago.  He will continue to have colonoscopies every other year as recommended.  He denies any recent infections.     Activities of Daily Living:  Patient reports morning stiffness for less than 5 minutes.   Patient Denies nocturnal pain.  Difficulty dressing/grooming: Denies Difficulty climbing stairs: Denies Difficulty getting out of chair: Denies Difficulty using hands for taps, buttons, cutlery, and/or writing: Denies  Review of Systems  Constitutional:  Negative for fatigue.  HENT:  Negative for mouth sores, mouth dryness and nose dryness.   Eyes:   Negative for pain, itching and dryness.  Respiratory:  Negative for shortness of breath and difficulty breathing.   Cardiovascular:  Negative for chest pain and palpitations.  Gastrointestinal:  Negative for blood in stool, constipation and diarrhea.  Endocrine: Negative for increased urination.  Genitourinary:  Negative for difficulty urinating.  Musculoskeletal:  Positive for joint pain, joint pain and joint swelling. Negative for myalgias, morning stiffness, muscle tenderness and myalgias.  Skin:  Negative for color change, rash and redness.  Allergic/Immunologic: Negative for susceptible to infections.  Neurological:  Negative for dizziness, numbness, headaches, memory loss and weakness.  Hematological:  Negative for bruising/bleeding tendency.  Psychiatric/Behavioral:  Negative for confusion.    PMFS History:  Patient Active Problem List   Diagnosis Date Noted   Rheumatoid arthritis ([place])    High risk medication use 05/25/2016   Pain, neck 05/25/2016   Arthralgia of both hands 05/25/2016   Pain of both sacroiliac joints 05/25/2016   Neck mass    Thrombocytosis 08/20/2013   Normocytic anemia 08/19/2013   Ulcerative colitis ([place]) 08/16/2013   Ulcerative colitis, acute ([place]) 08/16/2013   History of leukocytosis 08/16/2013    Past Medical History:  Diagnosis Date   Hypertension    Rheumatoid arthritis ([place])    Ulcerative colitis ([place])     Family History  Problem  Relation Age of Onset   Asthma Mother    Hypertension Mother    Parkinson's disease Father    Hypertension Father    Stroke Father    Heart attack Maternal Grandmother    Heart attack Maternal Grandfather    Stroke Paternal Grandmother    Heart attack Paternal Grandfather    Asthma Daughter    Past Surgical History:  Procedure Laterality Date   COLONOSCOPY N/A 08/18/2013   Procedure: COLONOSCOPY;  Surgeon: Winfield Cunas., MD;  Location: [facility];  Service: Endoscopy;  Laterality: N/A;   CYST  REMOVAL NECK     HERNIA REPAIR     2009   MOLE REMOVAL Right    knee   MOLE REMOVAL Left    shoulder   VASECTOMY     Social History   Social History Narrative   Not on file   Immunization History  Administered Date(s) Administered   PFIZER(Purple Top)SARS-COV-2 Vaccination 09/04/2019, 09/28/2019   Zoster Recombinat (Shingrix) 07/10/2018     Objective: Vital Signs: BP 131/84 (BP Location: Left Arm, Patient Position: Sitting, Cuff Size: Normal)    Pulse 89    Ht 5' 7"  (1.702 m)    Wt 188 lb (85.3 kg)    BMI 29.44 kg/m    Physical Exam Vitals and nursing note reviewed.  Constitutional:      Appearance: He is well-developed.  HENT:     Head: Normocephalic and atraumatic.  Eyes:     Conjunctiva/sclera: Conjunctivae normal.     Pupils: Pupils are equal, round, and reactive to light.  Pulmonary:     Effort: Pulmonary effort is normal.  Abdominal:     Palpations: Abdomen is soft.  Musculoskeletal:     Cervical back: Normal range of motion and neck supple.  Skin:    General: Skin is warm and dry.     Capillary Refill: Capillary refill takes less than 2 seconds.  Neurological:     Mental Status: He is alert and oriented to person, place, and time.  Psychiatric:        Behavior: Behavior normal.     Musculoskeletal Exam: C-spine, thoracic spine, and lumbar spine have good range of motion with no discomfort.  No midline spinal tenderness or SI joint tenderness.  Shoulder joints, elbow joints, wrist joints, MCPs, PIPs, DIPs have good range of motion with no synovitis.  Complete fist formation bilaterally.  Hip joints have good range of motion with no groin pain.  He has some stiffness and discomfort with range of motion of the right knee joint.  No warmth of the knee joints noted.  Ankle joints have good range of motion with no tenderness or joint swelling.  No evidence of Achilles tendinitis.  CDAI Exam: CDAI Score: -- Patient Global: --; Provider Global: -- Swollen: --;  Tender: -- Joint Exam 06/08/2021   No joint exam has been documented for this visit   There is currently no information documented on the homunculus. Go to the Rheumatology activity and complete the homunculus joint exam.  Investigation: No additional findings.  Imaging: No results found.  Recent Labs: Lab Results  Component Value Date   WBC 9.6 03/01/2021   HGB 15.0 03/01/2021   PLT 435 (H) 03/01/2021   NA 137 03/01/2021   K 4.6 03/01/2021   CL 99 03/01/2021   CO2 29 03/01/2021   GLUCOSE 91 03/01/2021   BUN 14 03/01/2021   CREATININE 1.23 03/01/2021   BILITOT 0.4 03/01/2021   ALKPHOS  67 06/08/2019   AST 25 03/01/2021   ALT 28 03/01/2021   PROT 7.4 03/01/2021   ALBUMIN 4.9 06/08/2019   CALCIUM 9.7 03/01/2021   GFRAA 69 11/23/2020   QFTBGOLDPLUS NEGATIVE 09/27/2020    Speciality Comments: No specialty comments available.  Procedures:  No procedures performed Allergies: Augmentin [amoxicillin-pot clavulanate] and Flagyl [metronidazole]   Assessment / Plan:     Visit Diagnoses: Inflammatory arthritis: He presents today with increased pain in the right knee joint which started 3 weeks ago.  On examination he had some discomfort with range of motion but no warmth or effusion was noted.  X-rays of the right knee joint were updated today.  We opted out of performing a cortisone injection since he had a recent punch biopsy on the medial aspect of his right knee which is not fully healed.  He is scheduled to have a larger excisional biopsy on 06/20/2021 so we will hold off on performing an intraarticular injection at this time. He has no other joint tenderness, synovitis, or dactylitis on examination.  Overall he has clinically been doing well taking xeljanz 10 mg 1 tablet twice daily (prescribed by Dr. Collene Mares), methotrexate 1.0 ml sq injections once weekly, and folic acid 2 mg daily.  He has not missed any doses recently.  He will remain on the current treatment regimen.  He plans  on keeping his follow up visit in February in case he needs a right knee joint injection at that time.    High risk medication use - Xeljanz 10 mg 1 tablet by mouth twice daily-Prescribed by Dr. Collene Mares, Methotrexate 1 mL sq injection every 7 days, and folic acid 1 mg 2 tablets daily. - Plan: CBC with Differential/Platelet, COMPLETE METABOLIC PANEL WITH GFR CBC and CMP updated on 03/01/21.  He is due to update lab work today but he would like to wait until early January to have lab work updated.  Standing orders for CBC and CMP were placed today. TB gold negative on 09/27/20.   He has not had any recent infections.  Discussed the importance of holding xeljanz and MTX if he develops signs or symptoms of an infection and to resume once the infection has completely cleared.    Effusion of right knee - He has no warmth or effusion on examination today.  He presented today with increased discomfort in the right knee joint which started 3 weeks ago.  He was in a restaurant and was walking on wet floors and "jammed his knee" while trying to brace himself.  He is not experiencing any mechanical symptoms.  X-rays of the right knee were updated today.  He had no warmth or effusion on examination.  We opted out of performing a cortisone injection today since he had a recent punch biopsy (not fully healed) slightly superior to where we would administer the intra-articular injection.  He will be having a larger excisional biopsy on 06/20/2021 at this site.  Discussed the risk of infection if we perform an intra-articular injection today.  He plans on keeping his follow-up visit in February 2023 in case his knee joint pain has not improved and we can perform a cortisone injection at that time.  Plan: XR KNEE 3 VIEW RIGHT  Chronic pain of right knee - He presents today with increased discomfort in the right knee joint, which started 3 weeks ago.  He was in a restaurant and braced himself on wet floors and has since then had  discomfort in his  right knee.  No mechanical symptoms.  On examination he has good range of motion with some discomfort but no warmth or effusion was noted.  He experiences crepitus intermittently..  X-rays of the right knee were updated today and reviewed with the patient today in the office.  Opted out of performing a cortisone injection as discussed above.  He will keep his routine follow-up visit in case his knee joint pain has not improved.  Plan: XR KNEE 3 VIEW RIGHT  Other ulcerative colitis with other complication [facility]): He has not had any signs or symptoms of a UC flare.  No constipation, diarrhea, or blood in his stool.  He remains on Xeljanz as prescribed by Dr. Collene Mares.  His last colonoscopy was on 02/05/20.    Trigger middle finger of right hand: He continues to experience intermittent locking and tenderness.  Different treatment options were discussed.  He declined proceeding with an ultrasound guided cortisone injection at this time.   DDD (degenerative disc disease), cervical: He has good ROM of the C-spine with no discomfort at this time.   Iron deficiency: History of. Resolved.   Thrombocytosis: Plt count 435 on 03/01/21.  We will continue to monitor lab work closely.  He is due to update lab work today but he requested to have lab work drawn in January.  Standing orders for CBC with diff and CMP with GFR placed today.   Normocytic anemia: Resolved     Orders: Orders Placed This Encounter  Procedures   Large Joint Inj   XR KNEE 3 VIEW RIGHT   CBC with Differential/Platelet   COMPLETE METABOLIC PANEL WITH GFR   No orders of the defined types were placed in this encounter.    Follow-Up Instructions: Return in about 3 months (around 09/06/2021) for Inflammatory arthritis, ulcerative colitis.   Ofilia Neas, PA-C  Note - This record has been created using Dragon software.  Chart creation errors have been sought, but may not always  have been located. Such creation errors do  not reflect on  the standard of medical care.

## 2021-07-23 ENCOUNTER — Other Ambulatory Visit: Payer: Self-pay | Admitting: Physician Assistant

## 2021-07-24 NOTE — Telephone Encounter (Signed)
Next Visit: 08/11/2021  Last Visit: 06/08/2021  Last Fill: 03/27/2021  DX: Inflammatory arthritis  Current Dose per office note 06/08/2021: Methotrexate 1 mL sq injection every 7 days  Labs: 03/01/2021 Platelet count is slightly elevated. Rest of CBC WNL.  CMP WNL.   Patient advised he is due to update labs. Patient will try to come this morning, if not then later this week to update.   Okay to refill MTX?

## 2021-07-26 ENCOUNTER — Other Ambulatory Visit: Payer: Self-pay | Admitting: *Deleted

## 2021-07-26 DIAGNOSIS — Z79899 Other long term (current) drug therapy: Secondary | ICD-10-CM

## 2021-07-26 LAB — CBC WITH DIFFERENTIAL/PLATELET
Absolute Monocytes: 608 cells/uL (ref 200–950)
Basophils Absolute: 40 cells/uL (ref 0–200)
Basophils Relative: 0.5 %
Eosinophils Absolute: 63 cells/uL (ref 15–500)
Eosinophils Relative: 0.8 %
HCT: 40.3 % (ref 38.5–50.0)
Hemoglobin: 13.9 g/dL (ref 13.2–17.1)
Lymphs Abs: 1296 cells/uL (ref 850–3900)
MCH: 33.4 pg — ABNORMAL HIGH (ref 27.0–33.0)
MCHC: 34.5 g/dL (ref 32.0–36.0)
MCV: 96.9 fL (ref 80.0–100.0)
MPV: 10.3 fL (ref 7.5–12.5)
Monocytes Relative: 7.7 %
Neutro Abs: 5893 cells/uL (ref 1500–7800)
Neutrophils Relative %: 74.6 %
Platelets: 394 10*3/uL (ref 140–400)
RBC: 4.16 10*6/uL — ABNORMAL LOW (ref 4.20–5.80)
RDW: 14.4 % (ref 11.0–15.0)
Total Lymphocyte: 16.4 %
WBC: 7.9 10*3/uL (ref 3.8–10.8)

## 2021-07-26 LAB — COMPLETE METABOLIC PANEL WITH GFR
AG Ratio: 1.8 (calc) (ref 1.0–2.5)
ALT: 18 U/L (ref 9–46)
AST: 17 U/L (ref 10–35)
Albumin: 4.6 g/dL (ref 3.6–5.1)
Alkaline phosphatase (APISO): 65 U/L (ref 35–144)
BUN/Creatinine Ratio: 14 (calc) (ref 6–22)
BUN: 19 mg/dL (ref 7–25)
CO2: 32 mmol/L (ref 20–32)
Calcium: 9.7 mg/dL (ref 8.6–10.3)
Chloride: 100 mmol/L (ref 98–110)
Creat: 1.36 mg/dL — ABNORMAL HIGH (ref 0.70–1.30)
Globulin: 2.5 g/dL (calc) (ref 1.9–3.7)
Glucose, Bld: 90 mg/dL (ref 65–99)
Potassium: 4.1 mmol/L (ref 3.5–5.3)
Sodium: 141 mmol/L (ref 135–146)
Total Bilirubin: 0.5 mg/dL (ref 0.2–1.2)
Total Protein: 7.1 g/dL (ref 6.1–8.1)
eGFR: 60 mL/min/{1.73_m2} (ref >=60)

## 2021-07-27 NOTE — Progress Notes (Signed)
Creatinine is borderline elevated-1.36 and GFR is WNL.  Rest of CMP WNL.   CBC stable.

## 2021-07-28 NOTE — Progress Notes (Signed)
Office Visit Note  Patient: Kenneth Payne             Date of Birth: [birth year]           MRN: [id number]             PCP: Wenda Low, MD Referring: Wenda Low, MD Visit Date: 08/11/2021 Occupation: @GUAROCC @  Subjective:  Fatigue   History of Present Illness: Kenneth Payne is a 61 y.o. male with history of inflammatory arthritis.  He is currently on methotrexate 1.0 ml sq injections once weekly and folic acid 2 mg daily and remains on xeljanz 10 mg 1 tablet by mouth two times daily.  He is tolerating his medications without any side effects and has not missed any doses recently.  He denies any signs or symptoms of an inflammatory arthritis flare since his last office visit.  He states that his right knee joint pain and swelling has improved.  He does not need a cortisone injection today.  He denies any Achilles denies or plantar fasciitis.  He denies any SI joint discomfort at this time.  He states that over the past couple of months he has been experiencing increased fatigue.  He states that in the evenings he has been falling asleep between 8 and 9 PM when typically he would go to sleep at around 11 PM.  He states that he has been sleeping well throughout the night with no interruptions and wakes up feeling restored in the morning.  He has been less active during the winter months since he has not been golfing as frequently or going to the gym.  He has an upcoming appointment with his PCP and plans on further discussing the fatigue has been experiencing. Of note the patient reports that he has been experiencing intermittent pins-and-needles in both feet since summer 2022.  He initially thought it was the shoes he was wearing at the pool but his symptoms have persisted despite changing shoes.  He denies any numbness or color changes.  He is not experiencing any increased joint pain and has no joint swelling in his feet currently.  He is concerned about the possible diagnosis of  neuropathy. He denies any recent infections. He has not had any changes in medications recently.      Activities of Daily Living:  Patient reports morning stiffness for 0 minutes.   Patient Denies nocturnal pain.  Difficulty dressing/grooming: Denies Difficulty climbing stairs: Denies Difficulty getting out of chair: Denies Difficulty using hands for taps, buttons, cutlery, and/or writing: Denies  Review of Systems  Constitutional:  Positive for fatigue.  HENT:  Negative for mouth sores, mouth dryness and nose dryness.   Eyes:  Negative for pain, itching and dryness.  Respiratory:  Negative for shortness of breath and difficulty breathing.   Cardiovascular:  Negative for chest pain and palpitations.  Gastrointestinal:  Negative for blood in stool, constipation and diarrhea.  Endocrine: Negative for increased urination.  Genitourinary:  Negative for difficulty urinating.  Musculoskeletal:  Negative for joint pain, joint pain, joint swelling, myalgias, morning stiffness, muscle tenderness and myalgias.  Skin:  Negative for color change, rash and redness.  Allergic/Immunologic: Negative for susceptible to infections.  Neurological:  Negative for dizziness, numbness, headaches, memory loss and weakness.  Hematological:  Negative for bruising/bleeding tendency.  Psychiatric/Behavioral:  Negative for confusion.    PMFS History:  Patient Active Problem List   Diagnosis Date Noted   Rheumatoid arthritis ([place])    High risk  medication use 05/25/2016   Pain, neck 05/25/2016   Arthralgia of both hands 05/25/2016   Pain of both sacroiliac joints 05/25/2016   Neck mass    Thrombocytosis 08/20/2013   Normocytic anemia 08/19/2013   Ulcerative colitis ([place]) 08/16/2013   Ulcerative colitis, acute ([place]) 08/16/2013   History of leukocytosis 08/16/2013    Past Medical History:  Diagnosis Date   Hypertension    Rheumatoid arthritis ([place])    Ulcerative colitis ([place])     Family History   Problem Relation Age of Onset   Asthma Mother    Hypertension Mother    Parkinson's disease Father    Hypertension Father    Stroke Father    Heart attack Maternal Grandmother    Heart attack Maternal Grandfather    Stroke Paternal Grandmother    Heart attack Paternal Grandfather    Asthma Daughter    Past Surgical History:  Procedure Laterality Date   COLONOSCOPY N/A 08/18/2013   Procedure: COLONOSCOPY;  Surgeon: Winfield Cunas., MD;  Location: [facility];  Service: Endoscopy;  Laterality: N/A;   CYST REMOVAL NECK     HERNIA REPAIR     2009   MOLE REMOVAL Right    knee   MOLE REMOVAL Left    shoulder   VASECTOMY     Social History   Social History Narrative   Not on file   Immunization History  Administered Date(s) Administered   PFIZER(Purple Top)SARS-COV-2 Vaccination 09/04/2019, 09/28/2019   Zoster Recombinat (Shingrix) 07/10/2018     Objective: Vital Signs: BP (!) 144/77 (BP Location: Left Arm, Patient Position: Sitting, Cuff Size: Normal)    Pulse 85    Ht 5' 7"  (1.702 m)    Wt 186 lb 12.8 oz (84.7 kg)    BMI 29.26 kg/m    Physical Exam Vitals and nursing note reviewed.  Constitutional:      Appearance: He is well-developed.  HENT:     Head: Normocephalic and atraumatic.  Eyes:     Conjunctiva/sclera: Conjunctivae normal.     Pupils: Pupils are equal, round, and reactive to light.  Pulmonary:     Effort: Pulmonary effort is normal.  Abdominal:     Palpations: Abdomen is soft.  Musculoskeletal:     Cervical back: Normal range of motion and neck supple.  Skin:    General: Skin is warm and dry.     Capillary Refill: Capillary refill takes less than 2 seconds.  Neurological:     Mental Status: He is alert and oriented to person, place, and time.  Psychiatric:        Behavior: Behavior normal.     Musculoskeletal Exam: C-spine, thoracic spine, and lumbar spine have good range of motion with no discomfort.  No midline spinal tenderness or SI  joint tenderness.  Shoulder joints, elbow joints, wrist joints, MCPs, PIPs, DIPs have good range of motion with no synovitis.  PIP and DIP thickening consistent with osteoarthritis of both hands.  He was able to make a complete fist bilaterally.  Hip joints have good range of motion with no groin pain.  Knee joints have good range of motion with some fullness in the right knee but no warmth or effusion was noted.  The left knee joint has good range of motion with no warmth or swelling.  Ankle joints have good range of motion with no tenderness or joint swelling.  CDAI Exam: CDAI Score: -- Patient Global: --; Provider Global: -- Swollen: --; Tender: --  Joint Exam 08/11/2021   No joint exam has been documented for this visit   There is currently no information documented on the homunculus. Go to the Rheumatology activity and complete the homunculus joint exam.  Investigation: No additional findings.  Imaging: No results found.  Recent Labs: Lab Results  Component Value Date   WBC 7.9 07/26/2021   HGB 13.9 07/26/2021   PLT 394 07/26/2021   NA 141 07/26/2021   K 4.1 07/26/2021   CL 100 07/26/2021   CO2 32 07/26/2021   GLUCOSE 90 07/26/2021   BUN 19 07/26/2021   CREATININE 1.36 (H) 07/26/2021   BILITOT 0.5 07/26/2021   ALKPHOS 67 06/08/2019   AST 17 07/26/2021   ALT 18 07/26/2021   PROT 7.1 07/26/2021   ALBUMIN 4.9 06/08/2019   CALCIUM 9.7 07/26/2021   GFRAA 69 11/23/2020   QFTBGOLDPLUS NEGATIVE 09/27/2020    Speciality Comments: No specialty comments available.  Procedures:  No procedures performed Allergies: Augmentin [amoxicillin-pot clavulanate] and Flagyl [metronidazole]   Assessment / Plan:     Visit Diagnoses: Inflammatory arthritis: He has no signs of synovitis or dactylitis on examination today.  He has not had any signs or symptoms of an inflammatory arthritis flare since his last office visit.  He is clinically doing well taking Xeljanz 10 mg 1 tablet twice  daily and methotrexate 1 mL subcu injections once weekly.  He has not missed any doses of these medications recently and continues to tolerate them without any side effects.  His right knee joint pain and swelling has improved since his last office visit.  He declined a cortisone injection in his right knee at this time.  He has not had any evidence of Achilles size or plantar fasciitis.  He has no SI joint tenderness upon palpation today.  He will remain on the current treatment regimen.  He was advised to notify us if he develops increased joint pain or joint swelling.  He will follow-up in the office in 5 months.  High risk medication use - Xeljanz 10 mg 1 tablet by mouth twice daily-Prescribed by Dr. Collene Mares, Methotrexate 1 mL sq injection every 7 days, and folic acid 1 mg 2 tablets daily. CBC and CMP updated on 07/26/21.  His next lab work will be due in May and every 3 months.   He has not had any recent infections.  Discussed the importance of holding methotrexate and Morrie Sheldon if he develops signs or symptoms of infection and to resume once the infection is completely cleared.  Effusion of right knee: Improved.  He declined a cortisone injection today. He will remain on the current treatment regimen.   Other ulcerative colitis with other complication [facility]) - He remains on Xeljanz as prescribed by Dr. Collene Mares.  His last colonoscopy was on 02/05/20.  He has not had any signs or symptoms of a UC flare.   Trigger middle finger of right hand: He experiences intermittent locking but overall his symptoms have been tolerable.  He declined wanting to schedule an ultrasound guided cortisone injection at this time.   DDD (degenerative disc disease), cervical: He has good ROM of the C-spine with no discomfort at this time.   Pins and needles sensation - He has been experiencing intermittent paresthesias in both feet for the past 6 months.  His symptoms initially started when he was wearing hard plastic shoes at the  pool during the summer but his symptoms have persisted despite changing his shoes.  He has not  noticed that his symptoms are worse at night.  He has not noticed any numbness or color changes in his feet.  He has not had any increased joint pain or joint swelling in his feet.  He is concerned about the possible diagnosis of neuropathy.  He declined referral to neurology at this time.  He does not want to proceed with a nerve conduction study at this time.  He has an upcoming appointment with his PCP and plans on further discussing his symptoms at that time.  A future order for folate and B12 was placed today to be drawn with his next lab work.  Discussed that being on methotrexate makes him more susceptible to folate deficiency.  He was encouraged to take his prescription for folic acid as prescribed.  He was advised to notify us if he develops any new or worsening symptoms.  Plan: B12 and Folate Panel  Other medical conditions are listed as follows:   Iron deficiency  Thrombocytosis: Platelet count was 394 which was within normal limits on 07/26/2021.  Normocytic anemia - Plan: B12 and Folate Panel    Orders: Orders Placed This Encounter  Procedures   B12 and Folate Panel   No orders of the defined types were placed in this encounter.    Follow-Up Instructions: Return in about 5 months (around 01/08/2022) for Inflammatory arthritis .   Ofilia Neas, PA-C  Note - This record has been created using Dragon software.  Chart creation errors have been sought, but may not always  have been located. Such creation errors do not reflect on  the standard of medical care.

## 2021-08-11 ENCOUNTER — Other Ambulatory Visit: Payer: Self-pay

## 2021-08-11 ENCOUNTER — Encounter: Payer: Self-pay | Admitting: Physician Assistant

## 2021-08-11 ENCOUNTER — Ambulatory Visit: Payer: PRIVATE HEALTH INSURANCE | Admitting: Physician Assistant

## 2021-08-11 VITALS — BP 144/77 | HR 85 | Ht 67.0 in | Wt 186.8 lb

## 2021-08-11 DIAGNOSIS — Z79899 Other long term (current) drug therapy: Secondary | ICD-10-CM

## 2021-08-11 DIAGNOSIS — K51818 Other ulcerative colitis with other complication: Secondary | ICD-10-CM

## 2021-08-11 DIAGNOSIS — R202 Paresthesia of skin: Secondary | ICD-10-CM

## 2021-08-11 DIAGNOSIS — M5030 Other cervical disc degeneration, unspecified cervical region: Secondary | ICD-10-CM

## 2021-08-11 DIAGNOSIS — D649 Anemia, unspecified: Secondary | ICD-10-CM

## 2021-08-11 DIAGNOSIS — E611 Iron deficiency: Secondary | ICD-10-CM

## 2021-08-11 DIAGNOSIS — M25461 Effusion, right knee: Secondary | ICD-10-CM

## 2021-08-11 DIAGNOSIS — M1990 Unspecified osteoarthritis, unspecified site: Secondary | ICD-10-CM

## 2021-08-11 DIAGNOSIS — M65331 Trigger finger, right middle finger: Secondary | ICD-10-CM

## 2021-08-11 DIAGNOSIS — D75839 Thrombocytosis, unspecified: Secondary | ICD-10-CM

## 2021-08-11 NOTE — Patient Instructions (Addendum)
Standing Labs We placed an order today for your standing lab work.   Please have your standing labs drawn in May and every 3 months   TB gold due in April/May   If possible, please have your labs drawn 2 weeks prior to your appointment so that the provider can discuss your results at your appointment.  Please note that you may see your imaging and lab results in [place] before we have reviewed them. We may be awaiting multiple results to interpret others before contacting you. Please allow our office up to 72 hours to thoroughly review all of the results before contacting the office for clarification of your results.  We have open lab daily: Monday through Thursday from 1:30-4:30 PM and Friday from 1:30-4:00 PM at the office of Dr. Bo Merino, [facility].   Please be advised, all patients with office appointments requiring lab work will take precedent over walk-in lab work.  If possible, please come for your lab work on Monday and Friday afternoons, as you may experience shorter wait times. The office is located at [address], [place], [place], [place] [address] No appointment is necessary.   Labs are drawn by [facility]. Please bring your co-pay at the time of your lab draw.  You may receive a bill from [facility] for your lab work.  Please note if you are on Hydroxychloroquine and and an order has been placed for a Hydroxychloroquine level, you will need to have it drawn 4 hours or more after your last dose.  If you wish to have your labs drawn at another location, please call the office 24 hours in advance to send orders.  If you have any questions regarding directions or hours of operation,  please call [phone].   As a reminder, please drink plenty of water prior to coming for your lab work. Thanks!

## 2021-09-01 ENCOUNTER — Other Ambulatory Visit: Payer: Self-pay | Admitting: Physician Assistant

## 2021-09-02 ENCOUNTER — Other Ambulatory Visit: Payer: Self-pay | Admitting: Physician Assistant

## 2021-09-04 NOTE — Telephone Encounter (Signed)
Next Visit: 01/12/2022 ?  ?Last Visit: 08/11/2021 ?  ?Last Fill: 09/05/2020 ? ?DX: Inflammatory arthritis ?  ?Current Dose per office note 08/11/2021:folic acid 1 mg 2 tablets daily. ? ?Okay to refill Folic Acid?  ?

## 2021-09-04 NOTE — Telephone Encounter (Signed)
Next Visit: 01/12/2022 ? ?Last Visit: 08/11/2021 ? ?Last Fill: 07/24/2021 (30 day supply) ? ?DX: Inflammatory arthritis ? ?Current Dose per office note 08/11/2021: Methotrexate 1 mL sq injection every 7 days ? ?Labs: 07/26/2021 Creatinine is borderline elevated-1.36 and GFR is WNL.  Rest of CMP WNL.   ?CBC stable.  ? ?Okay to refill MTX?  ?

## 2021-10-06 ENCOUNTER — Other Ambulatory Visit: Payer: Self-pay | Admitting: *Deleted

## 2021-10-06 DIAGNOSIS — Z79899 Other long term (current) drug therapy: Secondary | ICD-10-CM

## 2021-10-06 DIAGNOSIS — R202 Paresthesia of skin: Secondary | ICD-10-CM

## 2021-10-06 DIAGNOSIS — Z9225 Personal history of immunosupression therapy: Secondary | ICD-10-CM

## 2021-10-06 DIAGNOSIS — Z111 Encounter for screening for respiratory tuberculosis: Secondary | ICD-10-CM

## 2021-10-06 DIAGNOSIS — D649 Anemia, unspecified: Secondary | ICD-10-CM

## 2021-10-09 NOTE — Progress Notes (Signed)
CBC stable.  CMP WNL.  Folate and vitamin B12 WNL.

## 2021-10-10 LAB — COMPLETE METABOLIC PANEL WITH GFR
AG Ratio: 1.8 (calc) (ref 1.0–2.5)
ALT: 20 U/L (ref 9–46)
AST: 21 U/L (ref 10–35)
Albumin: 4.6 g/dL (ref 3.6–5.1)
Alkaline phosphatase (APISO): 63 U/L (ref 35–144)
BUN: 16 mg/dL (ref 7–25)
CO2: 27 mmol/L (ref 20–32)
Calcium: 9.6 mg/dL (ref 8.6–10.3)
Chloride: 103 mmol/L (ref 98–110)
Creat: 1.28 mg/dL (ref 0.70–1.30)
Globulin: 2.5 g/dL (calc) (ref 1.9–3.7)
Glucose, Bld: 85 mg/dL (ref 65–99)
Potassium: 4.6 mmol/L (ref 3.5–5.3)
Sodium: 139 mmol/L (ref 135–146)
Total Bilirubin: 0.4 mg/dL (ref 0.2–1.2)
Total Protein: 7.1 g/dL (ref 6.1–8.1)
eGFR: 64 mL/min/{1.73_m2} (ref >=60)

## 2021-10-10 LAB — CBC WITH DIFFERENTIAL/PLATELET
Absolute Monocytes: 636 cells/uL (ref 200–950)
Basophils Absolute: 38 cells/uL (ref 0–200)
Basophils Relative: 0.6 %
Eosinophils Absolute: 69 cells/uL (ref 15–500)
Eosinophils Relative: 1.1 %
HCT: 40.6 % (ref 38.5–50.0)
Hemoglobin: 13.6 g/dL (ref 13.2–17.1)
Lymphs Abs: 907 cells/uL (ref 850–3900)
MCH: 32.9 pg (ref 27.0–33.0)
MCHC: 33.5 g/dL (ref 32.0–36.0)
MCV: 98.1 fL (ref 80.0–100.0)
MPV: 10.5 fL (ref 7.5–12.5)
Monocytes Relative: 10.1 %
Neutro Abs: 4649 cells/uL (ref 1500–7800)
Neutrophils Relative %: 73.8 %
Platelets: 391 10*3/uL (ref 140–400)
RBC: 4.14 10*6/uL — ABNORMAL LOW (ref 4.20–5.80)
RDW: 14.7 % (ref 11.0–15.0)
Total Lymphocyte: 14.4 %
WBC: 6.3 10*3/uL (ref 3.8–10.8)

## 2021-10-10 LAB — QUANTIFERON-TB GOLD PLUS
Mitogen-NIL: 0.92 IU/mL
NIL: 0.01 IU/mL
QuantiFERON-TB Gold Plus: NEGATIVE
TB1-NIL: 0 IU/mL
TB2-NIL: 0 IU/mL

## 2021-10-10 LAB — B12 AND FOLATE PANEL
Folate: >24 ng/mL
Vitamin B-12: 443 pg/mL (ref 200–1100)

## 2021-10-11 NOTE — Progress Notes (Signed)
TB gold negative

## 2021-10-13 ENCOUNTER — Other Ambulatory Visit: Payer: Self-pay | Admitting: Physician Assistant

## 2021-10-13 NOTE — Telephone Encounter (Signed)
Next Visit: 01/12/2022 ?  ?Last Visit: 08/11/2021 ?  ?Last Fill: 10/04/2020 ? ?DX: Inflammatory arthritis ?  ?Okay to refill syringes?  ?

## 2021-12-09 ENCOUNTER — Other Ambulatory Visit: Payer: Self-pay | Admitting: Physician Assistant

## 2021-12-11 NOTE — Telephone Encounter (Signed)
Next Visit: 01/12/2022  Last Visit: 08/11/2021  Last Fill: 09/04/2021  DX: : Inflammatory arthritis  Current Dose per office note 08/11/2021: Methotrexate 1 mL sq injection every 7 days,  Labs: 10/06/2021, CBC stable.  CMP WNL.  Folate and vitamin B12 WNL  Okay to refill MTX?

## 2021-12-29 NOTE — Progress Notes (Signed)
Office Visit Note  Patient: Kenneth Payne             Date of Birth: [birth year]           MRN: [id number]             PCP: Wenda Low, MD Referring: Wenda Low, MD Visit Date: 01/12/2022 Occupation: @GUAROCC @  Subjective:  Medication management  History of Present Illness: Kenneth Payne is a 61 y.o. male with history of ulcerative colitis and inflammatory arthritis.  Kenneth Payne states Kenneth Payne has been doing well on the combination of Xeljanz 10 mg p.o. twice daily and methotrexate 1 mL subcu weekly along with folic acid.  Kenneth Payne has intermittent discomfort in his right knee joint especially when Kenneth Payne gets up from the sitting position.  Kenneth Payne has not noticed any joint swelling recently.  Kenneth Payne injured his right shoulder joint in November 2022.  Kenneth Payne has off-and-on discomfort in his right shoulder joint especially when Kenneth Payne is playing golf.  Kenneth Payne continues to have intermittent triggering of his right middle finger.  None of the other joints are painful.  Kenneth Payne states his ulcerative colitis is well controlled on the current combination.  Kenneth Payne experiences some tingling in his feet which is also intermittent.  Activities of Daily Living:  Patient reports morning stiffness for 0 minutes.   Patient Denies nocturnal pain.  Difficulty dressing/grooming: Denies Difficulty climbing stairs: Denies Difficulty getting out of chair: Denies Difficulty using hands for taps, buttons, cutlery, and/or writing: Reports  Review of Systems  Constitutional:  Negative for fatigue.  HENT:  Negative for mouth sores and mouth dryness.   Eyes:  Negative for dryness.  Respiratory:  Negative for shortness of breath.   Cardiovascular:  Negative for chest pain and palpitations.  Gastrointestinal:  Negative for blood in stool, constipation and diarrhea.  Endocrine: Negative for increased urination.  Genitourinary:  Negative for involuntary urination.  Musculoskeletal:  Positive for joint pain and joint pain. Negative for gait problem, joint  swelling, myalgias, muscle weakness, morning stiffness, muscle tenderness and myalgias.  Skin:  Negative for color change, rash, hair loss and sensitivity to sunlight.  Allergic/Immunologic: Negative for susceptible to infections.  Neurological:  Negative for dizziness and headaches.  Hematological:  Negative for swollen glands.  Psychiatric/Behavioral:  Negative for depressed mood and sleep disturbance. The patient is not nervous/anxious.     PMFS History:  Patient Active Problem List   Diagnosis Date Noted   Rheumatoid arthritis ([place])    High risk medication use 05/25/2016   Pain, neck 05/25/2016   Arthralgia of both hands 05/25/2016   Pain of both sacroiliac joints 05/25/2016   Neck mass    Thrombocytosis 08/20/2013   Normocytic anemia 08/19/2013   Ulcerative colitis ([place]) 08/16/2013   Ulcerative colitis, acute ([place]) 08/16/2013   History of leukocytosis 08/16/2013    Past Medical History:  Diagnosis Date   Hypertension    Rheumatoid arthritis ([place])    Ulcerative colitis ([place])     Family History  Problem Relation Age of Onset   Asthma Mother    Hypertension Mother    Parkinson's disease Father    Hypertension Father    Stroke Father    Heart attack Maternal Grandmother    Heart attack Maternal Grandfather    Stroke Paternal Grandmother    Heart attack Paternal Grandfather    Asthma Daughter    Past Surgical History:  Procedure Laterality Date   COLONOSCOPY N/A 08/18/2013   Procedure: COLONOSCOPY;  Surgeon:  Winfield Cunas., MD;  Location: [facility];  Service: Endoscopy;  Laterality: N/A;   CYST REMOVAL NECK     HERNIA REPAIR     2009   MOLE REMOVAL Right    knee   MOLE REMOVAL Left    shoulder   VASECTOMY     Social History   Social History Narrative   Not on file   Immunization History  Administered Date(s) Administered   PFIZER(Purple Top)SARS-COV-2 Vaccination 09/04/2019, 09/28/2019   Zoster Recombinat (Shingrix) 07/10/2018      Objective: Vital Signs: BP (!) 149/90 (BP Location: Left Arm, Patient Position: Sitting, Cuff Size: Normal)   Pulse 67   Ht 5' 7"  (1.702 m)   Wt 182 lb (82.6 kg)   BMI 28.51 kg/m    Physical Exam Vitals and nursing note reviewed.  Constitutional:      Appearance: Kenneth Payne is well-developed.  HENT:     Head: Normocephalic and atraumatic.  Eyes:     Conjunctiva/sclera: Conjunctivae normal.     Pupils: Pupils are equal, round, and reactive to light.  Cardiovascular:     Rate and Rhythm: Normal rate and regular rhythm.     Heart sounds: Normal heart sounds.  Pulmonary:     Effort: Pulmonary effort is normal.     Breath sounds: Normal breath sounds.  Abdominal:     General: Bowel sounds are normal.     Palpations: Abdomen is soft.  Musculoskeletal:     Cervical back: Normal range of motion and neck supple.  Skin:    General: Skin is warm and dry.     Capillary Refill: Capillary refill takes less than 2 seconds.  Neurological:     Mental Status: Kenneth Payne is alert and oriented to person, place, and time.  Psychiatric:        Behavior: Behavior normal.      Musculoskeletal Exam: C-spine thoracic and lumbar spine were in good range of motion.  Shoulder joints were in good range of motion with no point tenderness.  Elbow joints, wrist joints, MCPs PIPs and DIPs with good range of motion.  Kenneth Payne has thickening of right middle finger flexor tendon.  Hip joints and knee joints in good range of motion.  No warmth swelling or effusion was noted.  There was no tenderness over ankles or MTPs.  There was no evidence of Plan[place] fasciitis or Achilles tendinitis.  CDAI Exam: CDAI Score: -- Patient Global: --; Provider Global: -- Swollen: --; Tender: -- Joint Exam 01/12/2022   No joint exam has been documented for this visit   There is currently no information documented on the homunculus. Go to the Rheumatology activity and complete the homunculus joint exam.  Investigation: No additional  findings.  Imaging: No results found.  Recent Labs: Lab Results  Component Value Date   WBC 6.3 10/06/2021   HGB 13.6 10/06/2021   PLT 391 10/06/2021   NA 139 10/06/2021   K 4.6 10/06/2021   CL 103 10/06/2021   CO2 27 10/06/2021   GLUCOSE 85 10/06/2021   BUN 16 10/06/2021   CREATININE 1.28 10/06/2021   BILITOT 0.4 10/06/2021   ALKPHOS 67 06/08/2019   AST 21 10/06/2021   ALT 20 10/06/2021   PROT 7.1 10/06/2021   ALBUMIN 4.9 06/08/2019   CALCIUM 9.6 10/06/2021   GFRAA 69 11/23/2020   QFTBGOLDPLUS NEGATIVE 10/06/2021    Speciality Comments: No specialty comments available.  Procedures:  No procedures performed Allergies: Augmentin [amoxicillin-pot clavulanate] and Flagyl [metronidazole]  Assessment / Plan:     Visit Diagnoses: Inflammatory arthritis-Kenneth Payne has had problems with recurrent inflammatory arthritis associated with ulcerative colitis.  Kenneth Payne has had several right knee joint aspirations in the past.  His disease appears to be well controlled today on the current combination.  Kenneth Payne continues to have some discomfort in his right shoulder joint and right knee joint.  No synovitis was noted on the examination today.  High risk medication use - Xeljanz 10 mg 1 tablet by mouth twice daily-Prescribed by Dr. Collene Mares, Methotrexate 1 mL sq injection every 7 days, and folic acid 1 mg 2 tablets daily. -Labs obtained on October 06, 2021 CBC with differential and CMP with GFR were reviewed which are within normal limits.  TB gold was negative on October 06, 2021.  We will obtain labs today and every 3 months to monitor for drug toxicity.  Plan: CBC with Differential/Platelet, COMPLETE METABOLIC PANEL WITH GFR.  Information regarding immunization was placed in the AVS.  Kenneth Payne was also advised to hold Xeljanz and methotrexate if Kenneth Payne develops an infection and resume after the infection resolves.  Blackbox warning on Morrie Sheldon with increased risk of blood clots, heart attack and stroke was reviewed.  Chronic  right shoulder pain-patient had injury to his right shoulder joint in November 2022.  Kenneth Payne has intermittent discomfort while Kenneth Payne plays golf.  Kenneth Payne had good range of motion of his shoulder joint today.  I offered x-rays which she declined.  I will give a handout on shoulder joint exercises.  Chronic pain of right knee-Kenneth Payne has had problems with right knee joint recurrent effusion in the past.  No warmth swelling or effusion was noted today.  X-rays from June 08, 2021 were reviewed.  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  Muscle strengthening exercises were emphasized.  Other ulcerative colitis with other complication [facility]) - Kenneth Payne remains on Xeljanz as prescribed by Dr. Collene Mares.  His last colonoscopy was on 02/05/20.  Trigger middle finger of right hand-Kenneth Payne declined injection.  Kenneth Payne will try Voltaren gel.  Prescription for Voltaren gel was sent.  DDD (degenerative disc disease), cervical-Kenneth Payne had good range of motion of the cervical spine without discomfort.  Numbness and tingling-Kenneth Payne gives history of intermittent tingling in his feet when Kenneth Payne walks.  Kenneth Payne does not have any paresthesias while Kenneth Payne is resting.  I advised him to contact us if Kenneth Payne has persistent problems and we can refer him to neurology for evaluation.  Iron deficiency-Kenneth Payne was evaluated by hematology in the past.  His hemoglobin is stable.  Thrombocytosis resolved.  Orders: Orders Placed This Encounter  Procedures   CBC with Differential/Platelet   COMPLETE METABOLIC PANEL WITH GFR   Meds ordered this encounter  Medications   diclofenac Sodium (VOLTAREN) 1 % GEL    Sig: Apply 2-4 grams to affected joint 4 times daily as needed.    Dispense:  400 g    Refill:  2     Follow-Up Instructions: Return in about 5 months (around 06/14/2022) for Inflammatory arthritis, UC.   Bo Merino, MD  Note - This record has been created using Editor, commissioning.  Chart creation errors have been sought, but may not always  have been located. Such creation  errors do not reflect on  the standard of medical care.

## 2022-01-12 ENCOUNTER — Encounter: Payer: Self-pay | Admitting: Rheumatology

## 2022-01-12 ENCOUNTER — Ambulatory Visit (INDEPENDENT_AMBULATORY_CARE_PROVIDER_SITE_OTHER): Payer: PRIVATE HEALTH INSURANCE | Admitting: Rheumatology

## 2022-01-12 VITALS — BP 149/90 | HR 67 | Ht 67.0 in | Wt 182.0 lb

## 2022-01-12 DIAGNOSIS — M65331 Trigger finger, right middle finger: Secondary | ICD-10-CM

## 2022-01-12 DIAGNOSIS — R202 Paresthesia of skin: Secondary | ICD-10-CM

## 2022-01-12 DIAGNOSIS — D649 Anemia, unspecified: Secondary | ICD-10-CM

## 2022-01-12 DIAGNOSIS — M1990 Unspecified osteoarthritis, unspecified site: Secondary | ICD-10-CM

## 2022-01-12 DIAGNOSIS — M25561 Pain in right knee: Secondary | ICD-10-CM

## 2022-01-12 DIAGNOSIS — M25461 Effusion, right knee: Secondary | ICD-10-CM

## 2022-01-12 DIAGNOSIS — M1380 Other specified arthritis, unspecified site: Secondary | ICD-10-CM

## 2022-01-12 DIAGNOSIS — Z79899 Other long term (current) drug therapy: Secondary | ICD-10-CM | POA: Diagnosis not present

## 2022-01-12 DIAGNOSIS — E611 Iron deficiency: Secondary | ICD-10-CM

## 2022-01-12 DIAGNOSIS — K51818 Other ulcerative colitis with other complication: Secondary | ICD-10-CM

## 2022-01-12 DIAGNOSIS — M25511 Pain in right shoulder: Secondary | ICD-10-CM | POA: Diagnosis not present

## 2022-01-12 DIAGNOSIS — R200 Anesthesia of skin: Secondary | ICD-10-CM

## 2022-01-12 DIAGNOSIS — G8929 Other chronic pain: Secondary | ICD-10-CM

## 2022-01-12 DIAGNOSIS — M5030 Other cervical disc degeneration, unspecified cervical region: Secondary | ICD-10-CM

## 2022-01-12 DIAGNOSIS — D75839 Thrombocytosis, unspecified: Secondary | ICD-10-CM

## 2022-01-12 LAB — COMPLETE METABOLIC PANEL WITH GFR
AG Ratio: 1.7 (calc) (ref 1.0–2.5)
ALT: 30 U/L (ref 9–46)
AST: 25 U/L (ref 10–35)
Albumin: 4.5 g/dL (ref 3.6–5.1)
Alkaline phosphatase (APISO): 68 U/L (ref 35–144)
BUN: 20 mg/dL (ref 7–25)
CO2: 30 mmol/L (ref 20–32)
Calcium: 9.8 mg/dL (ref 8.6–10.3)
Chloride: 103 mmol/L (ref 98–110)
Creat: 1.13 mg/dL (ref 0.70–1.30)
Globulin: 2.6 g/dL (calc) (ref 1.9–3.7)
Glucose, Bld: 78 mg/dL (ref 65–99)
Potassium: 4.7 mmol/L (ref 3.5–5.3)
Sodium: 139 mmol/L (ref 135–146)
Total Bilirubin: 0.4 mg/dL (ref 0.2–1.2)
Total Protein: 7.1 g/dL (ref 6.1–8.1)
eGFR: 75 mL/min/{1.73_m2} (ref >=60)

## 2022-01-12 LAB — CBC WITH DIFFERENTIAL/PLATELET
Absolute Monocytes: 851 cells/uL (ref 200–950)
Basophils Absolute: 69 cells/uL (ref 0–200)
Basophils Relative: 0.8 %
Eosinophils Absolute: 60 cells/uL (ref 15–500)
Eosinophils Relative: 0.7 %
HCT: 39.8 % (ref 38.5–50.0)
Hemoglobin: 13.7 g/dL (ref 13.2–17.1)
Lymphs Abs: 998 cells/uL (ref 850–3900)
MCH: 33.1 pg — ABNORMAL HIGH (ref 27.0–33.0)
MCHC: 34.4 g/dL (ref 32.0–36.0)
MCV: 96.1 fL (ref 80.0–100.0)
MPV: 10.3 fL (ref 7.5–12.5)
Monocytes Relative: 9.9 %
Neutro Abs: 6622 cells/uL (ref 1500–7800)
Neutrophils Relative %: 77 %
Platelets: 428 10*3/uL — ABNORMAL HIGH (ref 140–400)
RBC: 4.14 10*6/uL — ABNORMAL LOW (ref 4.20–5.80)
RDW: 14.5 % (ref 11.0–15.0)
Total Lymphocyte: 11.6 %
WBC: 8.6 10*3/uL (ref 3.8–10.8)

## 2022-01-12 MED ORDER — DICLOFENAC SODIUM 1 % EX GEL: CUTANEOUS | 2 refills | Status: DC

## 2022-01-12 NOTE — Patient Instructions (Addendum)
Standing Labs We placed an order today for your standing lab work.   Please have your standing labs drawn in October and every 3 months  If possible, please have your labs drawn 2 weeks prior to your appointment so that the provider can discuss your results at your appointment.  Please note that you may see your imaging and lab results in [place] before we have reviewed them. We may be awaiting multiple results to interpret others before contacting you. Please allow our office up to 72 hours to thoroughly review all of the results before contacting the office for clarification of your results.  We have open lab daily: Monday through Thursday from 1:30-4:30 PM and Friday from 1:30-4:00 PM at the office of Dr. Bo Merino, [facility].   Please be advised, all patients with office appointments requiring lab work will take precedent over walk-in lab work.  If possible, please come for your lab work on Monday and Friday afternoons, as you may experience shorter wait times. The office is located at [address] [place], [place], [place] [address] No appointment is necessary.   Labs are drawn by [facility]. Please bring your co-pay at the time of your lab draw.  You may receive a bill from [facility] for your lab work.  Please note if you are on Hydroxychloroquine and and an order has been placed for a Hydroxychloroquine level, you will need to have it drawn 4 hours or more after your last dose.  If you wish to have your labs drawn at another location, please call the office 24 hours in advance to send orders.  If you have any questions regarding directions or hours of operation,  please call [phone].   As a reminder, please drink plenty of water prior to coming for your lab work. Thanks!   Vaccines You are taking a medication(s) that can suppress your immune system.  The following immunizations are recommended: Flu annually Covid-19  Td/Tdap (tetanus, diphtheria,  pertussis) every 10 years Pneumonia (Prevnar 15 then Pneumovax 23 at least 1 year apart.  Alternatively, can take Prevnar 20 without needing additional dose) Shingrix: 2 doses from 4 weeks to 6 months apart  Please check with your PCP to make sure you are up to date.   COVID-19 vaccine recommendations:   COVID-19 vaccine is recommended for everyone (unless you are allergic to a vaccine component), even if you are on a medication that suppresses your immune system.   If you are on Methotrexate, Cellcept (mycophenolate), Rinvoq, Morrie Sheldon, and Olumiant- hold the medication for 1 week after each vaccine. Hold Methotrexate for 2 weeks after the single dose COVID-19 vaccine.    Do not take Tylenol or any anti-inflammatory medications (NSAIDs) 24 hours prior to the COVID-19 vaccination.   There is no direct evidence about the efficacy of the COVID-19 vaccine in individuals who are on medications that suppress the immune system.   Even if you are fully vaccinated, and you are on any medications that suppress your immune system, please continue to wear a mask, maintain at least six feet social distance and practice hand hygiene.    Please see the following web sites for updated information.   [email or web address]://[email or web address]/[email or web address]-Vaccination-Patient-Resources.[email or web address]     If you have signs or symptoms of an infection or start antibiotics: First, call your PCP for workup of your infection. Hold your medication through the infection, until you complete your antibiotics, and until symptoms resolve if you take the following: Injectable medication (Actemra, Benlysta, Cimzia,  Cosentyx, Enbrel, Humira, Kevzara, Orencia, Remicade, Simponi, Stelara, Taltz, Tremfya) Methotrexate Leflunomide (Arava) Mycophenolate (Cellcept) Roma Kayser, or Rinvoq  Shoulder Exercises Ask your health care provider which exercises are safe for you. Do exercises exactly as told by your health care provider  and adjust them as directed. It is normal to feel mild stretching, pulling, tightness, or discomfort as you do these exercises. Stop right away if you feel sudden pain or your pain gets worse. Do not begin these exercises until told by your health care provider. Stretching exercises External rotation and abduction This exercise is sometimes called corner stretch. This exercise rotates your arm outward (external rotation) and moves your arm out from your body (abduction). Stand in a doorway with one of your feet slightly in front of the other. This is called a staggered stance. If you cannot reach your forearms to the door frame, stand facing a corner of a room. Choose one of the following positions as told by your health care provider: Place your hands and forearms on the door frame above your head. Place your hands and forearms on the door frame at the height of your head. Place your hands on the door frame at the height of your elbows. Slowly move your weight onto your front foot until you feel a stretch across your chest and in the front of your shoulders. Keep your head and chest upright and keep your abdominal muscles tight. Hold for __________ seconds. To release the stretch, shift your weight to your back foot. Repeat __________ times. Complete this exercise __________ times a day. Extension, standing Stand and hold a broomstick, a cane, or a similar object behind your back. Your hands should be a little wider than shoulder width apart. Your palms should face away from your back. Keeping your elbows straight and your shoulder muscles relaxed, move the stick away from your body until you feel a stretch in your shoulders (extension). Avoid shrugging your shoulders while you move the stick. Keep your shoulder blades tucked down toward the middle of your back. Hold for __________ seconds. Slowly return to the starting position. Repeat __________ times. Complete this exercise __________ times  a day. Range-of-motion exercises Pendulum  Stand near a wall or a surface that you can hold onto for balance. Bend at the waist and let your left / right arm hang straight down. Use your other arm to support you. Keep your back straight and do not lock your knees. Relax your left / right arm and shoulder muscles, and move your hips and your trunk so your left / right arm swings freely. Your arm should swing because of the motion of your body, not because you are using your arm or shoulder muscles. Keep moving your hips and trunk so your arm swings in the following directions, as told by your health care provider: Side to side. Forward and backward. In clockwise and counterclockwise circles. Continue each motion for __________ seconds, or for as long as told by your health care provider. Slowly return to the starting position. Repeat __________ times. Complete this exercise __________ times a day. Shoulder flexion, standing  Stand and hold a broomstick, a cane, or a similar object. Place your hands a little more than shoulder width apart on the object. Your left / right hand should be palm up, and your other hand should be palm down. Keep your elbow straight and your shoulder muscles relaxed. Push the stick up with your healthy arm to raise your left / right  arm in front of your body, and then over your head until you feel a stretch in your shoulder (flexion). Avoid shrugging your shoulder while you raise your arm. Keep your shoulder blade tucked down toward the middle of your back. Hold for __________ seconds. Slowly return to the starting position. Repeat __________ times. Complete this exercise __________ times a day. Shoulder abduction, standing Stand and hold a broomstick, a cane, or a similar object. Place your hands a little more than shoulder width apart on the object. Your left / right hand should be palm up, and your other hand should be palm down. Keep your elbow straight and your  shoulder muscles relaxed. Push the object across your body toward your left / right side. Raise your left / right arm to the side of your body (abduction) until you feel a stretch in your shoulder. Do not raise your arm above shoulder height unless your health care provider tells you to do that. If directed, raise your arm over your head. Avoid shrugging your shoulder while you raise your arm. Keep your shoulder blade tucked down toward the middle of your back. Hold for __________ seconds. Slowly return to the starting position. Repeat __________ times. Complete this exercise __________ times a day. Internal rotation  Place your left / right hand behind your back, palm up. Use your other hand to dangle an exercise band, a towel, or a similar object over your shoulder. Grasp the band with your left / right hand so you are holding on to both ends. Gently pull up on the band until you feel a stretch in the front of your left / right shoulder. The movement of your arm toward the center of your body is called internal rotation. Avoid shrugging your shoulder while you raise your arm. Keep your shoulder blade tucked down toward the middle of your back. Hold for __________ seconds. Release the stretch by letting go of the band and lowering your hands. Repeat __________ times. Complete this exercise __________ times a day. Strengthening exercises External rotation  Sit in a stable chair without armrests. Secure an exercise band to a stable object at elbow height on your left / right side. Place a soft object, such as a folded towel or a small pillow, between your left / right upper arm and your body to move your elbow about 4 inches (10 cm) away from your side. Hold the end of the exercise band so it is tight and there is no slack. Keeping your elbow pressed against the soft object, slowly move your forearm out, away from your abdomen (external rotation). Keep your body steady so only your forearm  moves. Hold for __________ seconds. Slowly return to the starting position. Repeat __________ times. Complete this exercise __________ times a day. Shoulder abduction  Sit in a stable chair without armrests, or stand up. Hold a __________ weight in your left / right hand, or hold an exercise band with both hands. Start with your arms straight down and your left / right palm facing in, toward your body. Slowly lift your left / right hand out to your side (abduction). Do not lift your hand above shoulder height unless your health care provider tells you that this is safe. Keep your arms straight. Avoid shrugging your shoulder while you do this movement. Keep your shoulder blade tucked down toward the middle of your back. Hold for __________ seconds. Slowly lower your arm, and return to the starting position. Repeat __________ times. Complete this  exercise __________ times a day. Shoulder extension Sit in a stable chair without armrests, or stand up. Secure an exercise band to a stable object in front of you so it is at shoulder height. Hold one end of the exercise band in each hand. Your palms should face each other. Straighten your elbows and lift your hands up to shoulder height. Step back, away from the secured end of the exercise band, until the band is tight and there is no slack. Squeeze your shoulder blades together as you pull your hands down to the sides of your thighs (extension). Stop when your hands are straight down by your sides. Do not let your hands go behind your body. Hold for __________ seconds. Slowly return to the starting position. Repeat __________ times. Complete this exercise __________ times a day. Shoulder row Sit in a stable chair without armrests, or stand up. Secure an exercise band to a stable object in front of you so it is at waist height. Hold one end of the exercise band in each hand. Position your palms so that your thumbs are facing the ceiling (neutral  position). Bend each of your elbows to a 90-degree angle (right angle) and keep your upper arms at your sides. Step back until the band is tight and there is no slack. Slowly pull your elbows back behind you. Hold for __________ seconds. Slowly return to the starting position. Repeat __________ times. Complete this exercise __________ times a day. Shoulder press-ups  Sit in a stable chair that has armrests. Sit upright, with your feet flat on the floor. Put your hands on the armrests so your elbows are bent and your fingers are pointing forward. Your hands should be about even with the sides of your body. Push down on the armrests and use your arms to lift yourself off the chair. Straighten your elbows and lift yourself up as much as you comfortably can. Move your shoulder blades down, and avoid letting your shoulders move up toward your ears. Keep your feet on the ground. As you get stronger, your feet should support less of your body weight as you lift yourself up. Hold for __________ seconds. Slowly lower yourself back into the chair. Repeat __________ times. Complete this exercise __________ times a day. Wall push-ups  Stand so you are facing a stable wall. Your feet should be about one arm-length away from the wall. Lean forward and place your palms on the wall at shoulder height. Keep your feet flat on the floor as you bend your elbows and lean forward toward the wall. Hold for __________ seconds. Straighten your elbows to push yourself back to the starting position. Repeat __________ times. Complete this exercise __________ times a day. This information is not intended to replace advice given to you by your health care provider. Make sure you discuss any questions you have with your health care provider. Document Revised: 06/06/2021 Document Reviewed: 07/11/2018 Elsevier Patient Education  [employer].

## 2022-01-14 NOTE — Progress Notes (Signed)
CBC and CMP are stable.

## 2022-04-09 ENCOUNTER — Other Ambulatory Visit: Payer: Self-pay | Admitting: Physician Assistant

## 2022-04-09 NOTE — Telephone Encounter (Signed)
Next Visit: 06/01/2022  Last Visit: 01/12/2022  Last Fill: 12/11/2021  DX:  Inflammatory arthritis  Current Dose per office note 01/12/2022: Methotrexate 1 mL sq injection every 7 days  Labs: 01/12/2022 CBC and CMP are stable.  Patient advised he is due to update labs this month. Patient states he will be by at the end of the week to update.   Okay to refill MTX?

## 2022-04-13 ENCOUNTER — Other Ambulatory Visit: Payer: Self-pay | Admitting: *Deleted

## 2022-04-13 DIAGNOSIS — Z79899 Other long term (current) drug therapy: Secondary | ICD-10-CM

## 2022-04-13 LAB — CBC WITH DIFFERENTIAL/PLATELET
Absolute Monocytes: 686 cells/uL (ref 200–950)
Basophils Absolute: 51 cells/uL (ref 0–200)
Basophils Relative: 0.7 %
Eosinophils Absolute: 44 cells/uL (ref 15–500)
Eosinophils Relative: 0.6 %
HCT: 39.8 % (ref 38.5–50.0)
Hemoglobin: 13.7 g/dL (ref 13.2–17.1)
Lymphs Abs: 1095 cells/uL (ref 850–3900)
MCH: 32.8 pg (ref 27.0–33.0)
MCHC: 34.4 g/dL (ref 32.0–36.0)
MCV: 95.2 fL (ref 80.0–100.0)
MPV: 10.4 fL (ref 7.5–12.5)
Monocytes Relative: 9.4 %
Neutro Abs: 5424 cells/uL (ref 1500–7800)
Neutrophils Relative %: 74.3 %
Platelets: 373 10*3/uL (ref 140–400)
RBC: 4.18 10*6/uL — ABNORMAL LOW (ref 4.20–5.80)
RDW: 14.2 % (ref 11.0–15.0)
Total Lymphocyte: 15 %
WBC: 7.3 10*3/uL (ref 3.8–10.8)

## 2022-04-13 NOTE — Progress Notes (Signed)
RBC count is borderline low but stable. Rest of CBC Wnl.

## 2022-04-16 ENCOUNTER — Telehealth: Payer: Self-pay | Admitting: *Deleted

## 2022-04-16 NOTE — Telephone Encounter (Signed)
Labs received from:[facility] on:03/12/2022  Reviewed by:Hazel Sams, PA-C  Labs drawn:CMP, Lipid Panel and TSH  Results:Cholesterol 206   NHDL 140   LDL Chol Calc. 123  Patient is on MTX 1 mL SQ weekly.

## 2022-05-21 NOTE — Progress Notes (Signed)
Office Visit Note  Patient: Kenneth Payne             Date of Birth: 1960/09/22           MRN: [id number]             PCP: Wenda Low, MD Referring: Wenda Low, MD Visit Date: 06/01/2022 Occupation: @GUAROCC @  Subjective:  Medication monitoring   History of Present Illness: Kenneth Payne is a 61 y.o. male with history of inflammatory arthritis and ulcerative colitis.  He is currently taking Xeljanz 10 mg 1 tablet by mouth twice daily-Prescribed by Dr. Collene Mares, Methotrexate 1 mL sq injection every 7 days, and folic acid 1 mg 2 tablets daily.  He is tolerating combination therapy without any side effects.  He has not missed any doses recently.  He denies any signs or symptoms of an ulcerative colitis or inflammatory arthritis flare.  He states his knee joints have been doing well and he has not had any recurrence of a knee joint effusion.  He states his right middle finger has been locking intermittently but is not interfering with his ADLs or golf.  He denies any Achilles tendinitis or plantar fasciitis.  He denies any SI joint discomfort.  He is not experiencing any other joint pain or joint swelling at this time.  He denies any new concerns.  He denies any recent or recurrent infections.   Activities of Daily Living:  Patient reports morning stiffness for 0 minutes.   Patient Denies nocturnal pain.  Difficulty dressing/grooming: Denies Difficulty climbing stairs: Denies Difficulty getting out of chair: Denies Difficulty using hands for taps, buttons, cutlery, and/or writing: Denies  Review of Systems  Constitutional:  Negative for fatigue.  HENT:  Negative for mouth sores and mouth dryness.   Eyes:  Negative for dryness.  Respiratory:  Negative for shortness of breath.   Cardiovascular:  Negative for chest pain and palpitations.  Gastrointestinal:  Negative for blood in stool, constipation and diarrhea.  Endocrine: Negative for increased urination.  Genitourinary:  Negative for  involuntary urination.  Musculoskeletal:  Negative for joint pain, gait problem, joint pain, joint swelling, myalgias, muscle weakness, morning stiffness, muscle tenderness and myalgias.  Skin:  Negative for color change, rash, hair loss and sensitivity to sunlight.  Allergic/Immunologic: Negative for susceptible to infections.  Neurological:  Negative for dizziness and headaches.  Hematological:  Negative for swollen glands.  Psychiatric/Behavioral:  Negative for depressed mood and sleep disturbance. The patient is not nervous/anxious.     PMFS History:  Patient Active Problem List   Diagnosis Date Noted   Rheumatoid arthritis ([place])    High risk medication use 05/25/2016   Pain, neck 05/25/2016   Arthralgia of both hands 05/25/2016   Pain of both sacroiliac joints 05/25/2016   Neck mass    Thrombocytosis 08/20/2013   Normocytic anemia 08/19/2013   Ulcerative colitis ([place]) 08/16/2013   Ulcerative colitis, acute ([place]) 08/16/2013   History of leukocytosis 08/16/2013    Past Medical History:  Diagnosis Date   Hypertension    Rheumatoid arthritis ([place])    Ulcerative colitis ([place])     Family History  Problem Relation Age of Onset   Asthma Mother    Hypertension Mother    Parkinson's disease Father    Hypertension Father    Stroke Father    Heart attack Maternal Grandmother    Heart attack Maternal Grandfather    Stroke Paternal Grandmother    Heart attack Paternal Grandfather  Asthma Daughter    Past Surgical History:  Procedure Laterality Date   COLONOSCOPY N/A 08/18/2013   Procedure: COLONOSCOPY;  Surgeon: Winfield Cunas., MD;  Location: [facility];  Service: Endoscopy;  Laterality: N/A;   CYST REMOVAL NECK     HERNIA REPAIR     2009   MOLE REMOVAL Right    knee   MOLE REMOVAL Left    shoulder   VASECTOMY     Social History   Social History Narrative   Not on file   Immunization History  Administered Date(s) Administered   PFIZER(Purple  Top)SARS-COV-2 Vaccination 09/04/2019, 09/28/2019   Zoster Recombinat (Shingrix) 07/10/2018     Objective: Vital Signs: BP 130/83 (BP Location: Left Arm, Patient Position: Sitting, Cuff Size: Normal)   Pulse 77   Resp 16   Ht 5' 7"  (1.702 m)   Wt 187 lb (84.8 kg)   BMI 29.29 kg/m    Physical Exam Vitals and nursing note reviewed.  Constitutional:      Appearance: He is well-developed.  HENT:     Head: Normocephalic and atraumatic.  Eyes:     Conjunctiva/sclera: Conjunctivae normal.     Pupils: Pupils are equal, round, and reactive to light.  Cardiovascular:     Rate and Rhythm: Normal rate and regular rhythm.     Heart sounds: Normal heart sounds.  Pulmonary:     Effort: Pulmonary effort is normal.     Breath sounds: Normal breath sounds.  Abdominal:     General: Bowel sounds are normal.     Palpations: Abdomen is soft.  Musculoskeletal:     Cervical back: Normal range of motion and neck supple.  Skin:    General: Skin is warm and dry.     Capillary Refill: Capillary refill takes less than 2 seconds.  Neurological:     Mental Status: He is alert and oriented to person, place, and time.  Psychiatric:        Behavior: Behavior normal.      Musculoskeletal Exam: C-spine, thoracic spine, lumbar spine have good range of motion.  No midline spinal tenderness or SI joint tenderness.  Shoulder joints, elbow joints, wrist joints, MCPs, PIPs, DIPs have good range of motion with no synovitis.  Complete fist formation bilaterally.  Thickening of the right middle flexor tendon noted.  Mild PIP and DIP prominence consistent with early osteoarthritic changes noted.  Hip joints have good range of motion with no groin pain.  Knee joints have good range of motion with no warmth or effusion.  Ankle joints have good range of motion with no tenderness or synovitis.  No evidence of Achilles tendinitis.  CDAI Exam: CDAI Score: -- Patient Global: --; Provider Global: -- Swollen: --; Tender:  -- Joint Exam 06/01/2022   No joint exam has been documented for this visit   There is currently no information documented on the homunculus. Go to the Rheumatology activity and complete the homunculus joint exam.  Investigation: No additional findings.  Imaging: No results found.  Recent Labs: Lab Results  Component Value Date   WBC 7.3 04/13/2022   HGB 13.7 04/13/2022   PLT 373 04/13/2022   NA 139 01/12/2022   K 4.7 01/12/2022   CL 103 01/12/2022   CO2 30 01/12/2022   GLUCOSE 78 01/12/2022   BUN 20 01/12/2022   CREATININE 1.13 01/12/2022   BILITOT 0.4 01/12/2022   ALKPHOS 67 06/08/2019   AST 25 01/12/2022   ALT 30 01/12/2022  PROT 7.1 01/12/2022   ALBUMIN 4.9 06/08/2019   CALCIUM 9.8 01/12/2022   GFRAA 69 11/23/2020   QFTBGOLDPLUS NEGATIVE 10/06/2021    Speciality Comments: No specialty comments available.  Procedures:  No procedures performed Allergies: Augmentin [amoxicillin-pot clavulanate] and Flagyl [metronidazole]   Assessment / Plan:     Visit Diagnoses: Inflammatory arthritis: He has no synovitis or dactylitis on examination today.  He has not had any signs or symptoms of a flare.  He has clinically been doing well taking Xeljanz 10 mg 1 tablet twice daily prescribed by Dr. Vaughan Browner and methotrexate 1 mL subcu injections every week along with folic acid 2 mg daily.  He has been tolerating combination therapy without any side effects and has not missed any doses recently.  He remains active and continues to golf without difficulty.  He has not had any recurrence of a right knee joint effusion.  He has no evidence of Achilles tendinitis or plantar fasciitis.  No SI joint tenderness upon palpation.  He has not had any ulcerative colitis flares.  He will remain on combination therapy as prescribed.  He was advised to notify us if he develops signs or symptoms of a flare.  He will follow-up in the office in 5 months or sooner if needed.  High risk medication use -  Xeljanz 10 mg 1 tablet by mouth twice daily-Prescribed by Dr. Collene Mares, Methotrexate 1 mL sq injection every 7 days, and folic acid 1 mg 2 tablets daily. CBC and CMP were updated today.  His next lab work will be due in March and every 3 months to monitor for drug toxicity.  Standing orders for CBC and CMP remain in place. TB gold negative on 10/06/21. He has not had any recent or recurrent infections.  Discussed the importance of holding xeljanz and methotrexate if he develops signs or symptoms of an infection and to resume once the infection has completely cleared.  Counseled on the increase risk of venous thrombosis. Counseled about FDA black box warning of MACE (major adverse CV events including cardiovascular death, myocardial infarction, and stroke).   Patient had lipid panel checked by his PCP on 02/28/2022.  He is not currently on statin therapy.  He was advised to make dietary changes and increase his activity level and have his lipid panel rechecked in 6 months. He has been trying to monitor his blood pressure closely.  His blood pressure was 130/83 today in the office.  - Plan: CBC with Differential/Platelet, COMPLETE METABOLIC PANEL WITH GFR  Chronic right shoulder pain - Patient had injury to his right shoulder joint in November 2022.  He has good range of motion of the right shoulder joint on examination today.  No tenderness or discomfort noted.  Chronic pain of right knee - He had mild chondromalacia patella.  He has good range of motion of the right knee joint on examination today.  No warmth or effusion noted.  Other ulcerative colitis with other complication [facility]) - He remains on Xeljanz as prescribed by Dr. Collene Mares.  His last colonoscopy was on 02/05/20.  He has not had any signs or symptoms of an ulcerative colitis flare.  Trigger middle finger of right hand: Thickening of the flexor tendon of the right middle finger noted.  He experiences intermittent locking but has no tenderness.  His  symptoms have not been interfering with his ADLs or cough.  He has declined a cortisone injection at this time.  He will notify us if his symptoms  persist or worsen.  DDD (degenerative disc disease), cervical: C-spine has good range of motion with no discomfort.  No symptoms of radiculopathy.  Numbness and tingling: Asymptomatic currently.  Iron deficiency - He was evaluated by hematology in the past.  His hemoglobin is stable-13.7 on 04/13/2022.  CBC with differential ordered today.   Orders: Orders Placed This Encounter  Procedures   CBC with Differential/Platelet   COMPLETE METABOLIC PANEL WITH GFR   Meds ordered this encounter  Medications   diclofenac Sodium (VOLTAREN) 1 % GEL    Sig: Apply 2-4 grams to affected joint 4 times daily as needed.    Dispense:  400 g    Refill:  2     Follow-Up Instructions: Return in about 5 months (around 10/31/2022) for Inflammatory arthritis .   Ofilia Neas, PA-C  Note - This record has been created using Dragon software.  Chart creation errors have been sought, but may not always  have been located. Such creation errors do not reflect on  the standard of medical care.

## 2022-06-01 ENCOUNTER — Ambulatory Visit: Payer: PRIVATE HEALTH INSURANCE | Attending: Physician Assistant | Admitting: Physician Assistant

## 2022-06-01 ENCOUNTER — Encounter: Payer: Self-pay | Admitting: Physician Assistant

## 2022-06-01 VITALS — BP 130/83 | HR 77 | Resp 16 | Ht 67.0 in | Wt 187.0 lb

## 2022-06-01 DIAGNOSIS — G8929 Other chronic pain: Secondary | ICD-10-CM

## 2022-06-01 DIAGNOSIS — E611 Iron deficiency: Secondary | ICD-10-CM

## 2022-06-01 DIAGNOSIS — Z79899 Other long term (current) drug therapy: Secondary | ICD-10-CM | POA: Diagnosis not present

## 2022-06-01 DIAGNOSIS — R202 Paresthesia of skin: Secondary | ICD-10-CM

## 2022-06-01 DIAGNOSIS — M1990 Unspecified osteoarthritis, unspecified site: Secondary | ICD-10-CM

## 2022-06-01 DIAGNOSIS — M25561 Pain in right knee: Secondary | ICD-10-CM | POA: Diagnosis not present

## 2022-06-01 DIAGNOSIS — M65331 Trigger finger, right middle finger: Secondary | ICD-10-CM

## 2022-06-01 DIAGNOSIS — R200 Anesthesia of skin: Secondary | ICD-10-CM

## 2022-06-01 DIAGNOSIS — M25511 Pain in right shoulder: Secondary | ICD-10-CM | POA: Diagnosis not present

## 2022-06-01 DIAGNOSIS — M1380 Other specified arthritis, unspecified site: Secondary | ICD-10-CM

## 2022-06-01 DIAGNOSIS — K51818 Other ulcerative colitis with other complication: Secondary | ICD-10-CM

## 2022-06-01 DIAGNOSIS — M5030 Other cervical disc degeneration, unspecified cervical region: Secondary | ICD-10-CM

## 2022-06-01 MED ORDER — DICLOFENAC SODIUM 1 % EX GEL: CUTANEOUS | 2 refills | Status: DC

## 2022-06-01 NOTE — Patient Instructions (Signed)
Standing Labs We placed an order today for your standing lab work.   Please have your standing labs drawn in March and every 3 months   Please have your labs drawn 2 weeks prior to your appointment so that the provider can discuss your lab results at your appointment.  Please note that you may see your imaging and lab results in [place] before we have reviewed them. We will contact you once all results are reviewed. Please allow our office up to 72 hours to thoroughly review all of the results before contacting the office for clarification of your results.  Lab hours are:   Monday through Thursday from 8:00 am -12:30 pm and 1:00 pm-5:00 pm and Friday from 8:00 am-12:00 pm.  Please be advised, all patients with office appointments requiring lab work will take precedent over walk-in lab work.   Labs are drawn by [facility]. Please bring your co-pay at the time of your lab draw.  You may receive a bill from [place] [facility] for your lab work.  Please note if you are on Hydroxychloroquine and and an order has been placed for a Hydroxychloroquine level, you will need to have it drawn 4 hours or more after your last dose.  If you wish to have your labs drawn at another location, please call the office 24 hours in advance so we can fax the orders.  The office is located at [address] [place], [place], [place] [address] No appointment is necessary.    If you have any questions regarding directions or hours of operation,  please call [phone].   As a reminder, please drink plenty of water prior to coming for your lab work. Thanks!  If you have signs or symptoms of an infection or start antibiotics: First, call your PCP for workup of your infection. Hold your medication through the infection, until you complete your antibiotics, and until symptoms resolve if you take the following: Injectable medication (Actemra, Benlysta, Cimzia, Cosentyx, Enbrel, Humira, Kevzara, Orencia, Remicade, Simponi,  Stelara, Taltz, Tremfya) Methotrexate Leflunomide (Arava) Mycophenolate (Cellcept) Morrie Sheldon, Olumiant, or Rinvoq   Vaccines You are taking a medication(s) that can suppress your immune system.  The following immunizations are recommended: Flu annually Covid-19  Td/Tdap (tetanus, diphtheria, pertussis) every 10 years Pneumonia (Prevnar 15 then Pneumovax 23 at least 1 year apart.  Alternatively, can take Prevnar 20 without needing additional dose) Shingrix: 2 doses from 4 weeks to 6 months apart  Please check with your PCP to make sure you are up to date.

## 2022-06-02 LAB — COMPLETE METABOLIC PANEL WITH GFR
AG Ratio: 1.8 (calc) (ref 1.0–2.5)
ALT: 18 U/L (ref 9–46)
AST: 17 U/L (ref 10–35)
Albumin: 4.4 g/dL (ref 3.6–5.1)
Alkaline phosphatase (APISO): 67 U/L (ref 35–144)
BUN: 19 mg/dL (ref 7–25)
CO2: 29 mmol/L (ref 20–32)
Calcium: 9.8 mg/dL (ref 8.6–10.3)
Chloride: 103 mmol/L (ref 98–110)
Creat: 1.15 mg/dL (ref 0.70–1.35)
Globulin: 2.4 g/dL (calc) (ref 1.9–3.7)
Glucose, Bld: 85 mg/dL (ref 65–99)
Potassium: 4.8 mmol/L (ref 3.5–5.3)
Sodium: 138 mmol/L (ref 135–146)
Total Bilirubin: 0.4 mg/dL (ref 0.2–1.2)
Total Protein: 6.8 g/dL (ref 6.1–8.1)
eGFR: 73 mL/min/{1.73_m2} (ref >=60)

## 2022-06-02 LAB — CBC WITH DIFFERENTIAL/PLATELET
Absolute Monocytes: 772 cells/uL (ref 200–950)
Basophils Absolute: 42 cells/uL (ref 0–200)
Basophils Relative: 0.5 %
Eosinophils Absolute: 66 cells/uL (ref 15–500)
Eosinophils Relative: 0.8 %
HCT: 38.5 % (ref 38.5–50.0)
Hemoglobin: 13.2 g/dL (ref 13.2–17.1)
Lymphs Abs: 1112 cells/uL (ref 850–3900)
MCH: 32.8 pg (ref 27.0–33.0)
MCHC: 34.3 g/dL (ref 32.0–36.0)
MCV: 95.8 fL (ref 80.0–100.0)
MPV: 10.5 fL (ref 7.5–12.5)
Monocytes Relative: 9.3 %
Neutro Abs: 6308 cells/uL (ref 1500–7800)
Neutrophils Relative %: 76 %
Platelets: 385 10*3/uL (ref 140–400)
RBC: 4.02 10*6/uL — ABNORMAL LOW (ref 4.20–5.80)
RDW: 14.6 % (ref 11.0–15.0)
Total Lymphocyte: 13.4 %
WBC: 8.3 10*3/uL (ref 3.8–10.8)

## 2022-06-04 NOTE — Progress Notes (Signed)
RBC count is borderline low but stable. Rest of CBC WNL.  CMP WNL

## 2022-06-14 ENCOUNTER — Other Ambulatory Visit: Payer: Self-pay | Admitting: Physician Assistant

## 2022-06-14 NOTE — Telephone Encounter (Signed)
Next Visit: 11/12/2022  Last Visit: 06/01/2022  Last Fill: 09/04/2021  Dx: Inflammatory arthritis   Current Dose per office note on 80/06/6551: folic acid 1 mg 2 tablets daily.   Okay to refill Folic Acid?

## 2022-07-16 ENCOUNTER — Other Ambulatory Visit: Payer: Self-pay | Admitting: *Deleted

## 2022-07-16 MED ORDER — METHOTREXATE SODIUM CHEMO INJECTION (PF) 50 MG/2ML: INTRAMUSCULAR | 2 refills | Status: DC

## 2022-07-16 NOTE — Telephone Encounter (Signed)
Refill request received via fax from [facility] for MTX   Next Visit: 11/12/2022  Last Visit: 06/01/2022  Last Fill: 04/09/2022  DX: Inflammatory arthritis   Current Dose per office note 06/01/2022: Methotrexate 1 mL sq injection every 7 days   Labs: 06/01/2022 RBC count is borderline low but stable. Rest of CBC WNL.  CMP WNL   Okay to refill MTX?

## 2022-09-07 LAB — HM COLONOSCOPY

## 2022-10-05 ENCOUNTER — Other Ambulatory Visit: Payer: Self-pay | Admitting: Physician Assistant

## 2022-10-05 NOTE — Telephone Encounter (Signed)
Last Fill: 10/13/2021  Next Visit: 11/12/2022   Last Visit: 06/01/2022   Dx: Inflammatory arthritis   Current Dose per office note on 06/01/2022: USE 1 SYRINGE WEKLY TO INJECT METHOTREXATE   Okay to refill Syringes?

## 2022-10-29 NOTE — Progress Notes (Signed)
Office Visit Note  Patient: Kenneth Payne             Date of Birth: [birth year]           MRN: [id number]             PCP: Georgann Housekeeper, MD Referring: Georgann Housekeeper, MD Visit Date: 11/12/2022 Occupation: @GUAROCC @  Subjective:  Discuss recent lab results   History of Present Illness: Kenneth Payne is a 62 y.o. male with history of inflammatory arthritis and ulcerative colitis.  Patient remains on Xeljanz 10 mg 1 tablet by mouth twice daily-Prescribed by Dr. Loreta Ave, Methotrexate 1 mL sq injection every 7 days, and folic acid 1 mg 2 tablets daily.  He is tolerating combination therapy without any side effects and has not missed any doses recently.  He had an updated colonoscopy on 09/07/2022 which was not consistent with active disease.  Patient denies any signs or symptoms of an arthritis flare.  He denies any increased joint swelling at this time.  He has been able to play golf with out difficulty.  He denies any nocturnal pain.  He denies any difficulty with ADLs.  He has not been experiencing any morning stiffness.  He continues to have a right middle trigger finger which has been locking on a daily basis but has been pain-free.  Activities of Daily Living:  Patient reports morning stiffness for 0 minutes.   Patient Denies nocturnal pain.  Difficulty dressing/grooming: Denies Difficulty climbing stairs: Denies Difficulty getting out of chair: Denies Difficulty using hands for taps, buttons, cutlery, and/or writing: Denies  Review of Systems  Constitutional:  Negative for fatigue.  HENT:  Negative for mouth sores and mouth dryness.   Eyes:  Negative for dryness.  Respiratory:  Negative for shortness of breath.   Cardiovascular:  Negative for chest pain and palpitations.  Gastrointestinal:  Negative for blood in stool, constipation and diarrhea.  Endocrine: Negative for increased urination.  Genitourinary:  Negative for involuntary urination.  Musculoskeletal:  Negative for joint  pain, gait problem, joint pain, joint swelling, myalgias, muscle weakness, morning stiffness, muscle tenderness and myalgias.  Skin:  Negative for color change, rash and sensitivity to sunlight.  Allergic/Immunologic: Negative for susceptible to infections.  Neurological:  Negative for dizziness and headaches.  Hematological:  Negative for swollen glands.  Psychiatric/Behavioral:  Negative for depressed mood and sleep disturbance. The patient is not nervous/anxious.     PMFS History:  Patient Active Problem List   Diagnosis Date Noted   Rheumatoid arthritis (HCC)    High risk medication use 05/25/2016   Pain, neck 05/25/2016   Arthralgia of both hands 05/25/2016   Pain of both sacroiliac joints 05/25/2016   Neck mass    Thrombocytosis 08/20/2013   Normocytic anemia 08/19/2013   Ulcerative colitis (HCC) 08/16/2013   Ulcerative colitis, acute (HCC) 08/16/2013   History of leukocytosis 08/16/2013    Past Medical History:  Diagnosis Date   Hypertension    Rheumatoid arthritis (HCC)    Ulcerative colitis (HCC)     Family History  Problem Relation Age of Onset   Asthma Mother    Hypertension Mother    Parkinson's disease Father    Hypertension Father    Stroke Father    Heart attack Maternal Grandmother    Heart attack Maternal Grandfather    Stroke Paternal Grandmother    Heart attack Paternal Grandfather    Asthma Daughter    Past Surgical History:  Procedure Laterality Date  COLONOSCOPY N/A 08/18/2013   Procedure: COLONOSCOPY;  Surgeon: Vertell Novak., MD;  Location: [facility];  Service: Endoscopy;  Laterality: N/A;   CYST REMOVAL NECK     HERNIA REPAIR     2009   MOLE REMOVAL Right    knee   MOLE REMOVAL Left    shoulder   VASECTOMY     Social History   Social History Narrative   Not on file   Immunization History  Administered Date(s) Administered   PFIZER(Purple Top)SARS-COV-2 Vaccination 09/04/2019, 09/28/2019   Zoster Recombinat (Shingrix)  07/10/2018     Objective: Vital Signs: BP (!) 143/100 (BP Location: Left Arm, Patient Position: Sitting, Cuff Size: Normal)   Pulse 67   Resp 15   Ht 5\' 7"  (1.702 m)   Wt 181 lb (82.1 kg)   BMI 28.35 kg/m    Physical Exam Vitals and nursing note reviewed.  Constitutional:      Appearance: He is well-developed.  HENT:     Head: Normocephalic and atraumatic.  Eyes:     Conjunctiva/sclera: Conjunctivae normal.     Pupils: Pupils are equal, round, and reactive to light.  Cardiovascular:     Rate and Rhythm: Normal rate and regular rhythm.     Heart sounds: Normal heart sounds.  Pulmonary:     Effort: Pulmonary effort is normal.     Breath sounds: Normal breath sounds.  Abdominal:     General: Bowel sounds are normal.     Palpations: Abdomen is soft.  Musculoskeletal:     Cervical back: Normal range of motion and neck supple.  Skin:    General: Skin is warm and dry.     Capillary Refill: Capillary refill takes less than 2 seconds.  Neurological:     Mental Status: He is alert and oriented to person, place, and time.  Psychiatric:        Behavior: Behavior normal.     Musculoskeletal Exam: C-spine has slightly limited ROM with lateral rotation.  No midline spinal tenderness.  No SI joint tenderness.  Shoulder joints, elbow joints, wrist joints, MCPs, PIPs, DIPs have good range of motion with no synovitis.  Complete fist formation bilaterally. Thickening of the right middle flexor tendon.  Mild PIP and DIP thickening consistent with early osteoarthritic changes.  Hip joints have good range of motion with no groin pain.  No tenderness over the trochanteric bursa bilaterally.  Some synovial thickening of the right knee joint but no warmth or effusion noted today.  Left knee joint has good range of motion with no warmth or effusion.  Ankle joints have good range of motion with no tenderness or synovitis.  No evidence of Achilles tendinitis or plantar fasciitis.  Mild thickening over  bilateral first MTP joints noted.  No synovitis noted.  CDAI Exam: CDAI Score: -- Patient Global: --; Provider Global: -- Swollen: --; Tender: -- Joint Exam 11/12/2022   No joint exam has been documented for this visit   There is currently no information documented on the homunculus. Go to the Rheumatology activity and complete the homunculus joint exam.  Investigation: No additional findings.  Imaging: No results found.  Recent Labs: Lab Results  Component Value Date   WBC 8.2 11/07/2022   HGB 13.6 11/07/2022   PLT 451 (H) 11/07/2022   NA 138 11/07/2022   K 4.7 11/07/2022   CL 100 11/07/2022   CO2 30 11/07/2022   GLUCOSE 76 11/07/2022   BUN 18 11/07/2022  CREATININE 1.09 11/07/2022   BILITOT 0.3 11/07/2022   ALKPHOS 67 06/08/2019   AST 17 11/07/2022   ALT 17 11/07/2022   PROT 7.1 11/07/2022   ALBUMIN 4.9 06/08/2019   CALCIUM 10.4 (H) 11/07/2022   GFRAA 69 11/23/2020   QFTBGOLDPLUS NEGATIVE 10/06/2021    Speciality Comments: No specialty comments available.  Procedures:  No procedures performed Allergies: Augmentin [amoxicillin-pot clavulanate] and Flagyl [metronidazole]   Assessment / Plan:     Visit Diagnoses: Inflammatory arthritis: No synovitis no dactylitis noted.  No evidence of Achilles tendinitis or plantar fasciitis.  No midline spinal tenderness or SI joint tenderness.  He has not had any signs or symptoms of an ulcerative colitis flare.  He had an updated colonoscopy on 09/07/2022 which was unremarkable per patient.  He remains on Xeljanz 10 mg 1 tablet twice daily prescribed by Dr. Loreta Ave and remains on methotrexate 1 mL sq injections once weekly.  He has been tolerating combination therapy without any side effects and has not missed any doses recently.  He has not had any recent or recurrent infections.  He remains active playing golf on a regular basis and has not had any difficulty with ADLs.  He has not been experiencing any morning stiffness or  nocturnal pain.  He will remain on combination therapy as prescribed.  He will follow-up in the office in 5 months or sooner if needed.  High risk medication use - Xeljanz 10 mg 1 tablet by mouth twice daily-Prescribed by Dr. Loreta Ave, Methotrexate 1 mL sq injection every 7 days, and folic acid 1 mg 2 tablets daily.  CBC and CMP updated on 11/07/22.  Platelet count was slightly elevated at 451K and calcium was borderline elevated-10.4.  Discussed that the thrombocytosis could be related to either chronic inflammation or possible recent infection.  Patient was encouraged to limit his calcium intake in his diet.  Patient plans on returning to recheck lab work in 1 month.  Standing orders for CBC and CMP remain in place. TB gold negative on 10/06/21.  TB Gold was not drawn with his routine lab work on 11/06/2022.  Future order was placed today to be drawn with his next lab work next month. No recent or recurrent infections.  Discussed the importance of holding methotrexate and xeljanz if he develops signs or symptoms of an infection and to resume once the infection has completely cleared.   - Plan: QuantiFERON-TB Gold Plus  Screening for tuberculosis - Future order for TB gold placed today to be drawn with his next lab results. Plan: QuantiFERON-TB Gold Plus  Chronic right shoulder pain: Good ROM of the right shoulder with no discomfort.   Chronic pain of right knee - Mild chondromalacia patella.  Good ROM of the right knee joint.  Thickening of joint capsule but no warmth or effusion noted today.   Other ulcerative colitis with other complication [facility]): He had a colonoscopy updated on 09/07/2022 by Dr. Loreta Ave.  No signs or symptoms of a flare.  He remains on xeljanz 10 mg twice daily prescribed by Dr. Loreta Ave.   Trigger middle finger of right hand: He has been experiencing a locking sensation today.  No tenderness upon palpation.   DDD (degenerative disc disease), cervical: C-spine has slightly limited ROM but  no discomfort at this time.   Iron deficiency    Orders: Orders Placed This Encounter  Procedures   QuantiFERON-TB Gold Plus   No orders of the defined types were placed in this encounter.  Follow-Up Instructions: Return in about 5 months (around 04/14/2023) for Inflammatory arthritis, UC .   Gearldine Bienenstock, PA-C  Note - This record has been created using Dragon software.  Chart creation errors have been sought, but may not always  have been located. Such creation errors do not reflect on  the standard of medical care.

## 2022-11-07 ENCOUNTER — Other Ambulatory Visit: Payer: Self-pay | Admitting: *Deleted

## 2022-11-07 DIAGNOSIS — M1990 Unspecified osteoarthritis, unspecified site: Secondary | ICD-10-CM

## 2022-11-07 DIAGNOSIS — Z79899 Other long term (current) drug therapy: Secondary | ICD-10-CM

## 2022-11-07 DIAGNOSIS — E611 Iron deficiency: Secondary | ICD-10-CM

## 2022-11-08 LAB — CBC WITH DIFFERENTIAL/PLATELET
Absolute Monocytes: 738 cells/uL (ref 200–950)
Basophils Absolute: 41 cells/uL (ref 0–200)
Basophils Relative: 0.5 %
Eosinophils Absolute: 57 cells/uL (ref 15–500)
Eosinophils Relative: 0.7 %
HCT: 40.6 % (ref 38.5–50.0)
Hemoglobin: 13.6 g/dL (ref 13.2–17.1)
Lymphs Abs: 1099 cells/uL (ref 850–3900)
MCH: 32.4 pg (ref 27.0–33.0)
MCHC: 33.5 g/dL (ref 32.0–36.0)
MCV: 96.7 fL (ref 80.0–100.0)
MPV: 10.2 fL (ref 7.5–12.5)
Monocytes Relative: 9 %
Neutro Abs: 6265 cells/uL (ref 1500–7800)
Neutrophils Relative %: 76.4 %
Platelets: 451 10*3/uL — ABNORMAL HIGH (ref 140–400)
RBC: 4.2 10*6/uL (ref 4.20–5.80)
RDW: 15.2 % — ABNORMAL HIGH (ref 11.0–15.0)
Total Lymphocyte: 13.4 %
WBC: 8.2 10*3/uL (ref 3.8–10.8)

## 2022-11-08 LAB — COMPLETE METABOLIC PANEL WITH GFR
AG Ratio: 1.7 (calc) (ref 1.0–2.5)
ALT: 17 U/L (ref 9–46)
AST: 17 U/L (ref 10–35)
Albumin: 4.5 g/dL (ref 3.6–5.1)
Alkaline phosphatase (APISO): 71 U/L (ref 35–144)
BUN: 18 mg/dL (ref 7–25)
CO2: 30 mmol/L (ref 20–32)
Calcium: 10.4 mg/dL — ABNORMAL HIGH (ref 8.6–10.3)
Chloride: 100 mmol/L (ref 98–110)
Creat: 1.09 mg/dL (ref 0.70–1.35)
Globulin: 2.6 g/dL (calc) (ref 1.9–3.7)
Glucose, Bld: 76 mg/dL (ref 65–99)
Potassium: 4.7 mmol/L (ref 3.5–5.3)
Sodium: 138 mmol/L (ref 135–146)
Total Bilirubin: 0.3 mg/dL (ref 0.2–1.2)
Total Protein: 7.1 g/dL (ref 6.1–8.1)
eGFR: 78 mL/min/{1.73_m2} (ref >=60)

## 2022-11-08 NOTE — Progress Notes (Signed)
Calcium is mildly elevated.  Patient should avoid calcium supplement.  CBC is stable.  Platelets are mildly elevated.  Will continue to monitor labs.

## 2022-11-12 ENCOUNTER — Ambulatory Visit: Payer: PRIVATE HEALTH INSURANCE | Attending: Physician Assistant | Admitting: Physician Assistant

## 2022-11-12 ENCOUNTER — Encounter: Payer: Self-pay | Admitting: Physician Assistant

## 2022-11-12 VITALS — BP 143/100 | HR 67 | Resp 15 | Ht 67.0 in | Wt 181.0 lb

## 2022-11-12 DIAGNOSIS — M25561 Pain in right knee: Secondary | ICD-10-CM

## 2022-11-12 DIAGNOSIS — M25511 Pain in right shoulder: Secondary | ICD-10-CM

## 2022-11-12 DIAGNOSIS — M65331 Trigger finger, right middle finger: Secondary | ICD-10-CM

## 2022-11-12 DIAGNOSIS — Z111 Encounter for screening for respiratory tuberculosis: Secondary | ICD-10-CM

## 2022-11-12 DIAGNOSIS — M1990 Unspecified osteoarthritis, unspecified site: Secondary | ICD-10-CM | POA: Diagnosis not present

## 2022-11-12 DIAGNOSIS — E611 Iron deficiency: Secondary | ICD-10-CM

## 2022-11-12 DIAGNOSIS — K51818 Other ulcerative colitis with other complication: Secondary | ICD-10-CM

## 2022-11-12 DIAGNOSIS — M5030 Other cervical disc degeneration, unspecified cervical region: Secondary | ICD-10-CM

## 2022-11-12 DIAGNOSIS — Z79899 Other long term (current) drug therapy: Secondary | ICD-10-CM | POA: Diagnosis not present

## 2022-11-12 DIAGNOSIS — G8929 Other chronic pain: Secondary | ICD-10-CM

## 2022-11-12 NOTE — Patient Instructions (Signed)
Standing Labs We placed an order today for your standing lab work.   Please have your standing labs drawn in 1 month and every 3 months   Please have your labs drawn 2 weeks prior to your appointment so that the provider can discuss your lab results at your appointment, if possible.  Please note that you may see your imaging and lab results in MyChart before we have reviewed them. We will contact you once all results are reviewed. Please allow our office up to 72 hours to thoroughly review all of the results before contacting the office for clarification of your results.  WALK-IN LAB HOURS  Monday through Thursday from 8:00 am -12:30 pm and 1:00 pm-5:00 pm and Friday from 8:00 am-12:00 pm.  Patients with office visits requiring labs will be seen before walk-in labs.  You may encounter longer than normal wait times. Please allow additional time. Wait times may be shorter on  Monday and Thursday afternoons.  We do not book appointments for walk-in labs. We appreciate your patience and understanding with our staff.   Labs are drawn by [facility]. Please bring your co-pay at the time of your lab draw.  You may receive a bill from [facility] for your lab work.  Please note if you are on Hydroxychloroquine and and an order has been placed for a Hydroxychloroquine level,  you will need to have it drawn 4 hours or more after your last dose.  If you wish to have your labs drawn at another location, please call the office 24 hours in advance so we can fax the orders.  The office is located at [address], [address], [place], [place] [address]   If you have any questions regarding directions or hours of operation,  please call [phone].   As a reminder, please drink plenty of water prior to coming for your lab work. Thanks!  

## 2022-11-29 ENCOUNTER — Other Ambulatory Visit: Payer: Self-pay | Admitting: *Deleted

## 2022-11-29 MED ORDER — METHOTREXATE SODIUM CHEMO INJECTION 50 MG/2ML
25.0000 mg | INTRAMUSCULAR | 1 refills | Status: DC
  Filled 2023-03-04 (×3): qty 4, 28d supply, fill #0

## 2022-11-29 NOTE — Telephone Encounter (Signed)
Last Fill: 07/16/2022  Labs: 11/07/2022 Calcium is mildly elevated.  Patient should avoid calcium supplement.  CBC is stable.  Platelets are mildly elevated.  Will continue to monitor labs.   Next Visit: 05/10/2023  Last Visit: 11/12/2022  DX:  Inflammatory arthritis:   Current Dose per office note 11/12/2022: Methotrexate 1 mL sq injection every 7 days   Okay to refill Methotrexate?

## 2022-12-28 ENCOUNTER — Other Ambulatory Visit (HOSPITAL_BASED_OUTPATIENT_CLINIC_OR_DEPARTMENT_OTHER): Payer: Self-pay

## 2022-12-31 ENCOUNTER — Other Ambulatory Visit (HOSPITAL_BASED_OUTPATIENT_CLINIC_OR_DEPARTMENT_OTHER): Payer: Self-pay

## 2023-01-02 ENCOUNTER — Other Ambulatory Visit (HOSPITAL_BASED_OUTPATIENT_CLINIC_OR_DEPARTMENT_OTHER): Payer: Self-pay

## 2023-01-03 ENCOUNTER — Other Ambulatory Visit (HOSPITAL_BASED_OUTPATIENT_CLINIC_OR_DEPARTMENT_OTHER): Payer: Self-pay

## 2023-01-03 MED ORDER — VALSARTAN-HYDROCHLOROTHIAZIDE 80-12.5 MG PO TABS
1.0000 | ORAL_TABLET | Freq: Every day | ORAL | 1 refills | Status: DC
  Filled 2023-03-04 (×2): qty 90, 90d supply, fill #0

## 2023-01-03 MED ORDER — FLUTICASONE PROPIONATE 50 MCG/ACT NA SUSP: 2.0000 | Freq: Two times a day (BID) | NASAL | 1 refills | Status: AC

## 2023-01-07 ENCOUNTER — Other Ambulatory Visit (HOSPITAL_BASED_OUTPATIENT_CLINIC_OR_DEPARTMENT_OTHER): Payer: Self-pay

## 2023-01-07 MED FILL — Tuberculin/Allergy Syringe/Needle (Disp) 1 ML 27 x 1/2": 84 days supply | Qty: 12 | Fill #0 | Status: AC

## 2023-03-04 ENCOUNTER — Other Ambulatory Visit (HOSPITAL_BASED_OUTPATIENT_CLINIC_OR_DEPARTMENT_OTHER): Payer: Self-pay

## 2023-03-04 ENCOUNTER — Other Ambulatory Visit: Payer: Self-pay

## 2023-03-25 ENCOUNTER — Other Ambulatory Visit (HOSPITAL_BASED_OUTPATIENT_CLINIC_OR_DEPARTMENT_OTHER): Payer: Self-pay

## 2023-03-25 MED FILL — Tuberculin/Allergy Syringe/Needle (Disp) 1 ML 27 x 1/2": 84 days supply | Qty: 12 | Fill #1 | Status: AC

## 2023-04-11 LAB — LAB REPORT - SCANNED: EGFR: 68

## 2023-04-15 ENCOUNTER — Telehealth: Payer: Self-pay | Admitting: *Deleted

## 2023-04-15 NOTE — Telephone Encounter (Signed)
Labs received from:[facility]  Drawn on:04/11/2023  Reviewed by: Dr. Pollyann Savoy   Labs drawn:CBC, CMP, Cholesterol Panel, PSA, TSH, Vitamin B12  Results: RBC 4.10   MCV 99.5   MCH 34.1   PLT 428   Neut % 80.9   Lymph % 9.5   Neut # 7.5   Lymph # 0.90   Cholesterol 228   HDLD 71   Calc LDL 136   Non-HDL 157  Patient is on MTX 1 mL SQ weekly and Folic Acid 2 mg po daily.

## 2023-04-17 ENCOUNTER — Other Ambulatory Visit: Payer: Self-pay

## 2023-04-17 ENCOUNTER — Other Ambulatory Visit: Payer: Self-pay | Admitting: Physician Assistant

## 2023-04-17 ENCOUNTER — Other Ambulatory Visit (HOSPITAL_BASED_OUTPATIENT_CLINIC_OR_DEPARTMENT_OTHER): Payer: Self-pay

## 2023-04-17 MED ORDER — METHOTREXATE SODIUM CHEMO INJECTION 50 MG/2ML
25.0000 mg | INTRAMUSCULAR | 2 refills | Status: DC
  Filled 2023-04-17: qty 4, 28d supply, fill #0
  Filled 2023-05-20: qty 4, 28d supply, fill #1
  Filled 2023-07-05: qty 4, 28d supply, fill #2

## 2023-04-17 MED FILL — Folic Acid Tab 1 MG: ORAL | 67 days supply | Qty: 134 | Fill #0 | Status: AC

## 2023-04-17 NOTE — Telephone Encounter (Signed)
Last Fill: 11/29/2022  Labs: 04/11/2023 RBC 4.10   MCV 99.5   MCH 34.1   PLT 428   Neut % 80.9   Lymph % 9.5   Neut # 7.5   Lymph # 0.90   Cholesterol 228   HDLD 71   Calc LDL 136   Non-HDL 157  Next Visit: 05/10/2023  Last Visit: 11/12/2022  DX: Inflammatory arthritis   Current Dose per office note 11/12/2022: Methotrexate 1 mL sq injection every 7 days   Okay to refill Methotrexate?

## 2023-04-29 NOTE — Progress Notes (Signed)
Office Visit Note  Patient: Kenneth Payne             Date of Birth: 1960/08/04           MRN: [id number]             PCP: Georgann Housekeeper, MD Referring: Georgann Housekeeper, MD Visit Date: 05/10/2023 Occupation: @GUAROCC @  Subjective:  Right index finger pain  History of Present Illness: Kenneth Payne is a 62 y.o. male with ulcerative colitis and inflammatory arthritis.  He returns today after his last visit in May 2024.  He states he has been having intermittent discomfort in his right second MCP joint.  He drives a lot for work and sits for prolonged time in the vehicle.  He also plays golf.  He has not noticed any joint swelling.  None of the other joints are painful.  He denies any history of plantar fasciitis or Achilles tendinitis.  He has been taking methotrexate 1 mL subcu weekly along with folic acid 2 mg daily prescribed by [place]  and Xeljanz 10 mg p.o. twice daily which is prescribed by Dr. Loreta Ave.  Patient denies any interruption in the treatment.  He denies any morning stiffness or any flares of ulcerative colitis.  He has intermittent triggering of his right middle finger which is not bothersome.    Activities of Daily Living:  Patient reports morning stiffness for 0 minutes.   Patient Denies nocturnal pain.  Difficulty dressing/grooming: Denies Difficulty climbing stairs: Denies Difficulty getting out of chair: Denies Difficulty using hands for taps, buttons, cutlery, and/or writing: Denies  Review of Systems  Constitutional:  Negative for fatigue.  HENT:  Negative for mouth sores and mouth dryness.   Eyes:  Negative for dryness.  Respiratory:  Negative for shortness of breath.   Cardiovascular:  Negative for chest pain and palpitations.  Gastrointestinal:  Negative for blood in stool, constipation and diarrhea.  Endocrine: Negative for increased urination.  Genitourinary:  Negative for involuntary urination.  Musculoskeletal:  Positive for joint pain, joint pain, myalgias and  myalgias. Negative for gait problem, joint swelling, muscle weakness, morning stiffness and muscle tenderness.  Skin:  Positive for sensitivity to sunlight. Negative for color change, rash and hair loss.  Allergic/Immunologic: Negative for susceptible to infections.  Neurological:  Negative for dizziness and headaches.  Hematological:  Negative for swollen glands.  Psychiatric/Behavioral:  Negative for depressed mood and sleep disturbance. The patient is not nervous/anxious.     PMFS History:  Patient Active Problem List   Diagnosis Date Noted   Rheumatoid arthritis (HCC)    High risk medication use 05/25/2016   Pain, neck 05/25/2016   Arthralgia of both hands 05/25/2016   Pain of both sacroiliac joints 05/25/2016   Neck mass    Thrombocytosis 08/20/2013   Normocytic anemia 08/19/2013   Ulcerative colitis (HCC) 08/16/2013   Ulcerative colitis, acute (HCC) 08/16/2013   History of leukocytosis 08/16/2013    Past Medical History:  Diagnosis Date   Hypertension    Rheumatoid arthritis (HCC)    Ulcerative colitis (HCC)     Family History  Problem Relation Age of Onset   Asthma Mother    Hypertension Mother    Parkinson's disease Father    Hypertension Father    Stroke Father    Heart attack Maternal Grandmother    Heart attack Maternal Grandfather    Stroke Paternal Grandmother    Heart attack Paternal Grandfather    Asthma Daughter  Past Surgical History:  Procedure Laterality Date   COLONOSCOPY N/A 08/18/2013   Procedure: COLONOSCOPY;  Surgeon: Vertell Novak., MD;  Location: [facility];  Service: Endoscopy;  Laterality: N/A;   CYST REMOVAL NECK     HERNIA REPAIR     2009   MOLE REMOVAL Right    knee   MOLE REMOVAL Left    shoulder   SQUAMOUS CELL CARCINOMA EXCISION     left side collar bone area- Dr. Margo Aye, dermatologist. 02/2023   VASECTOMY     Social History   Social History Narrative   Not on file   Immunization History  Administered Date(s)  Administered   PFIZER(Purple Top)SARS-COV-2 Vaccination 09/04/2019, 09/28/2019   Zoster Recombinant(Shingrix) 07/10/2018     Objective: Vital Signs: BP (!) 152/90 (BP Location: Left Arm, Patient Position: Sitting, Cuff Size: Normal)   Pulse 76   Resp 15   Ht 5\' 7"  (1.702 m)   Wt 186 lb 3.2 oz (84.5 kg)   BMI 29.16 kg/m    Physical Exam Vitals and nursing note reviewed.  Constitutional:      Appearance: He is well-developed.  HENT:     Head: Normocephalic and atraumatic.  Eyes:     Conjunctiva/sclera: Conjunctivae normal.     Pupils: Pupils are equal, round, and reactive to light.  Cardiovascular:     Rate and Rhythm: Normal rate and regular rhythm.     Heart sounds: Normal heart sounds.  Pulmonary:     Effort: Pulmonary effort is normal.     Breath sounds: Normal breath sounds.  Abdominal:     General: Bowel sounds are normal.     Palpations: Abdomen is soft.  Musculoskeletal:     Cervical back: Normal range of motion and neck supple.  Skin:    General: Skin is warm and dry.     Capillary Refill: Capillary refill takes less than 2 seconds.  Neurological:     Mental Status: He is alert and oriented to person, place, and time.  Psychiatric:        Behavior: Behavior normal.      Musculoskeletal Exam: Cervical, thoracic and lumbar spine 1 good range of motion.  No SI joint tenderness.  Shoulders, elbows, wrist joints, MCPs PIPs and DIPs with good range of motion with no synovitis.  There was no tenderness over right second MCP joint on palpation.  He has thickening of the right middle finger flexor tendon.  PIP and DIP prominence with no synovitis was noted.  Hip joints and knee joints with good range of motion without any warmth swelling or effusion.  There was no tenderness over ankles or MTPs.  No Achilles tendinitis was noted.  CDAI Exam: CDAI Score: -- Patient Global: --; Provider Global: -- Swollen: --; Tender: -- Joint Exam 05/10/2023   No joint exam has been  documented for this visit   There is currently no information documented on the homunculus. Go to the Rheumatology activity and complete the homunculus joint exam.  Investigation: No additional findings.  Imaging: No results found.  Recent Labs: Lab Results  Component Value Date   WBC 8.2 11/07/2022   HGB 13.6 11/07/2022   PLT 451 (H) 11/07/2022   NA 138 11/07/2022   K 4.7 11/07/2022   CL 100 11/07/2022   CO2 30 11/07/2022   GLUCOSE 76 11/07/2022   BUN 18 11/07/2022   CREATININE 1.09 11/07/2022   BILITOT 0.3 11/07/2022   ALKPHOS 67 06/08/2019   AST 17  11/07/2022   ALT 17 11/07/2022   PROT 7.1 11/07/2022   ALBUMIN 4.9 06/08/2019   CALCIUM 10.4 (H) 11/07/2022   GFRAA 69 11/23/2020   QFTBGOLDPLUS NEGATIVE 10/06/2021   04/11/2023 CBC and CMP were normal, LDL 136 at his PCPs office.  Speciality Comments: Please ensure TB Gold is drawn yearly.  Procedures:  No procedures performed Allergies: Augmentin [amoxicillin-pot clavulanate] and Flagyl [metronidazole]   Assessment / Plan:     Visit Diagnoses: Inflammatory arthritis-patient denies any episodes of joint swelling.  He denies any history of Achilles tendinitis or plantar fasciitis.  He denies any SI joint discomfort.  He states he has intermittent discomfort in his right second MCP joint.  He has not noticed any joint swelling.  He drives for many hours and holds steering wheel which puts stress on his hands.  He also plays golf.  Although he denies any discomfort while he is playing golf.  None of the other joints are painful.  No synovitis or tenderness was noted on the examination of the right second MCP joint.    He continues to be on Xeljanz, methotrexate and folic acid without any interruption.  Other ulcerative colitis with other complication (HCC) -he denies any flares of ulcerative colitis.  He had a colonoscopy updated on 09/07/2022 by Dr. Loreta Ave.  No signs or symptoms of a flare.  He remains on xeljanz 10 mg twice  daily prescribed by Dr. Loreta Ave.  High risk medication use - Xeljanz 10 mg 1 tablet by mouth twice daily-Prescribed by Dr. Loreta Ave, Methotrexate 1 mL sq injection every 7 days, and folic acid 1 mg 2 tablets daily. -Labs obtained on April 11, 2023 by Dr. Donette Larry showed normal CBC and CMP.  LDL was elevated at 136.  He was advised to get labs CBC and CMP every 3 months to monitor for drug toxicity.  Information on immunization was placed in the AVS.  He was advised to hold his Harriette Ohara and methotrexate if he develops an infection and resume after the infection resolves.  FDA blackbox warning associated with Harriette Ohara for major adverse cardiovascular events, serious infections and lymphomas was discussed.  A handout was placed in the AVS.  TB Gold was negative on October 06, 2021.  Plan: QuantiFERON-TB Gold Plus  Pain in right hand-he complains of discomfort in his right second MCP joint.  No tenderness or synovitis was noted on the examination.  A handout on hand exercises was given.  Joint protection was discussed.  Chronic pain of right knee -he has not experienced any recent discomfort.  He is to have inflammation in the past.  He has mild chondromalacia patella.  Trigger middle finger of right hand-he reports intermittent triggering which is not bothersome.  DDD (degenerative disc disease), cervical-he continues to have some stiffness but no discomfort.  Mixed hyperlipidemia-LDL was 136 in October 2024.  Patient is currently not taking any medications.  Dietary modifications and exercise was emphasized.  Association of heart disease with autoimmune disease was discussed.  Primary hypertension-blood pressure was elevated at 146/85.  Repeat blood pressure was 152/90.  Patient was advised to monitor blood pressure closely and follow-up with his PCP.  He is currently on valsartan HCTZ.  BMI 29.16-weight loss diet and exercise was emphasized.  Iron deficiency  Orders: Orders Placed This Encounter   Procedures   QuantiFERON-TB Gold Plus   No orders of the defined types were placed in this encounter.    Follow-Up Instructions: Return in about 5 months (around  10/08/2023) for UC, Inflammatory arthritis.   Pollyann Savoy, MD  Note - This record has been created using Animal nutritionist.  Chart creation errors have been sought, but may not always  have been located. Such creation errors do not reflect on  the standard of medical care.

## 2023-05-10 ENCOUNTER — Ambulatory Visit: Payer: PRIVATE HEALTH INSURANCE | Attending: Rheumatology | Admitting: Rheumatology

## 2023-05-10 ENCOUNTER — Encounter: Payer: Self-pay | Admitting: Rheumatology

## 2023-05-10 VITALS — BP 152/90 | HR 76 | Resp 15 | Ht 67.0 in | Wt 186.2 lb

## 2023-05-10 DIAGNOSIS — M1990 Unspecified osteoarthritis, unspecified site: Secondary | ICD-10-CM

## 2023-05-10 DIAGNOSIS — K51818 Other ulcerative colitis with other complication: Secondary | ICD-10-CM

## 2023-05-10 DIAGNOSIS — Z79899 Other long term (current) drug therapy: Secondary | ICD-10-CM

## 2023-05-10 DIAGNOSIS — I10 Essential (primary) hypertension: Secondary | ICD-10-CM

## 2023-05-10 DIAGNOSIS — Z6826 Body mass index (BMI) 26.0-26.9, adult: Secondary | ICD-10-CM

## 2023-05-10 DIAGNOSIS — M5030 Other cervical disc degeneration, unspecified cervical region: Secondary | ICD-10-CM

## 2023-05-10 DIAGNOSIS — M79641 Pain in right hand: Secondary | ICD-10-CM

## 2023-05-10 DIAGNOSIS — M25561 Pain in right knee: Secondary | ICD-10-CM

## 2023-05-10 DIAGNOSIS — E782 Mixed hyperlipidemia: Secondary | ICD-10-CM

## 2023-05-10 DIAGNOSIS — E611 Iron deficiency: Secondary | ICD-10-CM

## 2023-05-10 DIAGNOSIS — G8929 Other chronic pain: Secondary | ICD-10-CM

## 2023-05-10 DIAGNOSIS — M65331 Trigger finger, right middle finger: Secondary | ICD-10-CM

## 2023-05-10 NOTE — Patient Instructions (Addendum)
Hand Exercises Hand exercises can be helpful for almost anyone. They can strengthen your hands and improve flexibility and movement. The exercises can also increase blood flow to the hands. These results can make your work and daily tasks easier for you. Hand exercises can be especially helpful for people who have joint pain from arthritis or nerve damage from using their hands over and over. These exercises can also help people who injure a hand. Exercises Most of these hand exercises are gentle stretching and motion exercises. It is usually safe to do them often throughout the day. Warming up your hands before exercise may help reduce stiffness. You can do this with gentle massage or by placing your hands in warm water for 10-15 minutes. It is normal to feel some stretching, pulling, tightness, or mild discomfort when you begin new exercises. In time, this will improve. Remember to always be careful and stop right away if you feel sudden, very bad pain or your pain gets worse. You want to get better and be safe. Ask your health care provider which exercises are safe for you. Do exercises exactly as told by your provider and adjust them as told. Do not begin these exercises until told by your provider. Knuckle bend or "claw" fist  Stand or sit with your arm, hand, and all five fingers pointed straight up. Make sure to keep your wrist straight. Gently bend your fingers down toward your palm until the tips of your fingers are touching your palm. Keep your big knuckle straight and only bend the small knuckles in your fingers. Hold this position for 10 seconds. Straighten your fingers back to your starting position. Repeat this exercise 5-10 times with each hand. Full finger fist  Stand or sit with your arm, hand, and all five fingers pointed straight up. Make sure to keep your wrist straight. Gently bend your fingers into your palm until the tips of your fingers are touching the middle of your  palm. Hold this position for 10 seconds. Extend your fingers back to your starting position, stretching every joint fully. Repeat this exercise 5-10 times with each hand. Straight fist  Stand or sit with your arm, hand, and all five fingers pointed straight up. Make sure to keep your wrist straight. Gently bend your fingers at the big knuckle, where your fingers meet your hand, and at the middle knuckle. Keep the knuckle at the tips of your fingers straight and try to touch the bottom of your palm. Hold this position for 10 seconds. Extend your fingers back to your starting position, stretching every joint fully. Repeat this exercise 5-10 times with each hand. Tabletop  Stand or sit with your arm, hand, and all five fingers pointed straight up. Make sure to keep your wrist straight. Gently bend your fingers at the big knuckle, where your fingers meet your hand, as far down as you can. Keep the small knuckles in your fingers straight. Think of forming a tabletop with your fingers. Hold this position for 10 seconds. Extend your fingers back to your starting position, stretching every joint fully. Repeat this exercise 5-10 times with each hand. Finger spread  Place your hand flat on a table with your palm facing down. Make sure your wrist stays straight. Spread your fingers and thumb apart from each other as far as you can until you feel a gentle stretch. Hold this position for 10 seconds. Bring your fingers and thumb tight together again. Hold this position for 10 seconds. Repeat  this exercise 5-10 times with each hand. Making circles  Stand or sit with your arm, hand, and all five fingers pointed straight up. Make sure to keep your wrist straight. Make a circle by touching the tip of your thumb to the tip of your index finger. Hold for 10 seconds. Then open your hand wide. Repeat this motion with your thumb and each of your fingers. Repeat this exercise 5-10 times with each hand. Thumb  motion  Sit with your forearm resting on a table and your wrist straight. Your thumb should be facing up toward the ceiling. Keep your fingers relaxed as you move your thumb. Lift your thumb up as high as you can toward the ceiling. Hold for 10 seconds. Bend your thumb across your palm as far as you can, reaching the tip of your thumb for the small finger (pinkie) side of your palm. Hold for 10 seconds. Repeat this exercise 5-10 times with each hand. Grip strengthening  Hold a stress ball or other soft ball in the middle of your hand. Slowly increase the pressure, squeezing the ball as much as you can without causing pain. Think of bringing the tips of your fingers into the middle of your palm. All of your finger joints should bend when doing this exercise. Hold your squeeze for 10 seconds, then relax. Repeat this exercise 5-10 times with each hand. Contact a health care provider if: Your hand pain or discomfort gets much worse when you do an exercise. Your hand pain or discomfort does not improve within 2 hours after you exercise. If you have either of these problems, stop doing these exercises right away. Do not do them again unless your provider says that you can. Get help right away if: You develop sudden, severe hand pain or swelling. If this happens, stop doing these exercises right away. Do not do them again unless your provider says that you can. This information is not intended to replace advice given to you by your health care provider. Make sure you discuss any questions you have with your health care provider. Document Revised: 06/26/2022 Document Reviewed: 06/26/2022 Elsevier Patient Education  2024 [employer]  Standing Labs We placed an order today for your standing lab work.   Please have your standing labs drawn in January and every 3 months  Please have your labs drawn 2 weeks prior to your appointment so that the provider can discuss your lab results at your  appointment, if possible.  Please note that you may see your imaging and lab results in MyChart before we have reviewed them. We will contact you once all results are reviewed. Please allow our office up to 72 hours to thoroughly review all of the results before contacting the office for clarification of your results.  WALK-IN LAB HOURS  Monday through Thursday from 8:00 am -12:30 pm and 1:00 pm-5:00 pm and Friday from 8:00 am-12:00 pm.  Patients with office visits requiring labs will be seen before walk-in labs.  You may encounter longer than normal wait times. Please allow additional time. Wait times may be shorter on  Monday and Thursday afternoons.  We do not book appointments for walk-in labs. We appreciate your patience and understanding with our staff.   Labs are drawn by [facility]. Please bring your co-pay at the time of your lab draw.  You may receive a bill from [facility] for your lab work.  Please note if you are on Hydroxychloroquine and and an order has been placed  for a Hydroxychloroquine level,  you will need to have it drawn 4 hours or more after your last dose.  If you wish to have your labs drawn at another location, please call the office 24 hours in advance so we can fax the orders.  The office is located at [address], [address], [place], [place] [address]   If you have any questions regarding directions or hours of operation,  please call [phone].   As a reminder, please drink plenty of water prior to coming for your lab work. Thanks!   Vaccines You are taking a medication(s) that can suppress your immune system.  The following immunizations are recommended: Flu annually Covid-19  RSV Td/Tdap (tetanus, diphtheria, pertussis) every 10 years Pneumonia (Prevnar 15 then Pneumovax 23 at least 1 year apart.  Alternatively, can take Prevnar 20 without needing additional dose) Shingrix: 2 doses from 4 weeks to 6 months apart  Please check with your PCP to make sure  you are up to date.   If you have signs or symptoms of an infection or start antibiotics: First, call your PCP for workup of your infection. Hold your medication through the infection, until you complete your antibiotics, and until symptoms resolve if you take the following: Injectable medication (Actemra, Benlysta, Cimzia, Cosentyx, Enbrel, Humira, Kevzara, Orencia, Remicade, Simponi, Stelara, Taltz, Tremfya) Methotrexate Leflunomide (Arava) Mycophenolate (Cellcept) Osborne [place], or Rinvoq   Because you are taking Harriette Ohara, Rinvoq, or Olumiant, it is very important to know that this class of medications has a FDA BLACK BOX WARNING for major adverse cardiovascular events (MACE), thrombosis, mortality (including sudden cardiovascular death), serious infections, and lymphomas. MACE is defined as cardiovascular death, myocardial infarction, and stroke. Thrombosis includes deep venous thrombosis (DVT), pulmonary embolism (PE), and arterial thrombosis. If you are a current or former smoker, you are at higher risk for MACE.

## 2023-05-12 LAB — QUANTIFERON-TB GOLD PLUS
Mitogen-NIL: 0.51 [IU]/mL
NIL: 0.01 [IU]/mL
QuantiFERON-TB Gold Plus: NEGATIVE
TB1-NIL: 0 [IU]/mL
TB2-NIL: 0 [IU]/mL

## 2023-05-13 ENCOUNTER — Encounter: Payer: Self-pay | Admitting: *Deleted

## 2023-05-13 DIAGNOSIS — L821 Other seborrheic keratosis: Secondary | ICD-10-CM | POA: Insufficient documentation

## 2023-05-13 DIAGNOSIS — D099 Carcinoma in situ, unspecified: Secondary | ICD-10-CM | POA: Insufficient documentation

## 2023-05-13 NOTE — Progress Notes (Signed)
TB Gold negative

## 2023-05-20 ENCOUNTER — Other Ambulatory Visit (HOSPITAL_BASED_OUTPATIENT_CLINIC_OR_DEPARTMENT_OTHER): Payer: Self-pay

## 2023-06-04 ENCOUNTER — Other Ambulatory Visit (HOSPITAL_BASED_OUTPATIENT_CLINIC_OR_DEPARTMENT_OTHER): Payer: Self-pay

## 2023-06-04 MED ORDER — VALSARTAN-HYDROCHLOROTHIAZIDE 80-12.5 MG PO TABS
1.0000 | ORAL_TABLET | Freq: Every day | ORAL | 3 refills | Status: DC
  Filled 2023-06-04: qty 90, 90d supply, fill #0
  Filled 2023-08-31: qty 90, 90d supply, fill #1
  Filled 2023-12-03: qty 90, 90d supply, fill #2
  Filled 2024-02-28: qty 90, 90d supply, fill #3

## 2023-06-05 ENCOUNTER — Other Ambulatory Visit (HOSPITAL_BASED_OUTPATIENT_CLINIC_OR_DEPARTMENT_OTHER): Payer: Self-pay

## 2023-06-13 ENCOUNTER — Other Ambulatory Visit (HOSPITAL_BASED_OUTPATIENT_CLINIC_OR_DEPARTMENT_OTHER): Payer: Self-pay

## 2023-06-13 MED FILL — Tuberculin/Allergy Syringe/Needle (Disp) 1 ML 27 x 1/2": 84 days supply | Qty: 12 | Fill #2 | Status: AC

## 2023-06-14 ENCOUNTER — Other Ambulatory Visit (HOSPITAL_BASED_OUTPATIENT_CLINIC_OR_DEPARTMENT_OTHER): Payer: Self-pay

## 2023-06-18 ENCOUNTER — Other Ambulatory Visit: Payer: Self-pay | Admitting: Physician Assistant

## 2023-06-20 ENCOUNTER — Other Ambulatory Visit (HOSPITAL_BASED_OUTPATIENT_CLINIC_OR_DEPARTMENT_OTHER): Payer: Self-pay

## 2023-06-20 MED ORDER — FOLIC ACID 1 MG PO TABS
2.0000 mg | ORAL_TABLET | Freq: Every day | ORAL | 3 refills | Status: DC
  Filled 2023-06-20: qty 180, 90d supply, fill #0
  Filled 2023-09-09: qty 180, 90d supply, fill #1
  Filled 2023-12-16: qty 180, 90d supply, fill #2
  Filled 2024-03-13: qty 180, 90d supply, fill #3

## 2023-06-20 NOTE — Telephone Encounter (Signed)
Last Fill: 06/14/2022  Next Visit: 10/14/2023  Last Visit: 05/10/2023  Dx:  Inflammatory arthritis   Current Dose per office note on 05/10/2023: folic acid 1 mg 2 tablets daily   Okay to refill Folic Acid?

## 2023-07-08 ENCOUNTER — Other Ambulatory Visit (HOSPITAL_BASED_OUTPATIENT_CLINIC_OR_DEPARTMENT_OTHER): Payer: Self-pay

## 2023-08-21 ENCOUNTER — Other Ambulatory Visit (HOSPITAL_BASED_OUTPATIENT_CLINIC_OR_DEPARTMENT_OTHER): Payer: Self-pay

## 2023-08-21 ENCOUNTER — Other Ambulatory Visit: Payer: Self-pay | Admitting: Rheumatology

## 2023-08-21 MED ORDER — METHOTREXATE SODIUM CHEMO INJECTION 50 MG/2ML
25.0000 mg | INTRAMUSCULAR | 0 refills | Status: DC
  Filled 2023-08-21: qty 4, 28d supply, fill #0

## 2023-08-21 NOTE — Telephone Encounter (Signed)
 Last Fill: 04/17/2023  Labs: 04/11/2023               RBC 4.10   MCV 99.5   MCH 34.1   PLT 428   Neut % 80.9   Lymph % 9.5   Neut # 7.5   Lymph # 0.90   Cholesterol 228   HDLD 71   Calc LDL 136   Non-HDL 157  Next Visit: 10/14/2023  Last Visit: 05/10/2023  DX: Inflammatory arthritis   Current Dose per office note 05/10/2023: Methotrexate 1 mL sq injection every 7 days   Patient advised he is due to update labs. Patient states he will come by this week to update.   Okay to refill Methotrexate?

## 2023-08-22 ENCOUNTER — Other Ambulatory Visit: Payer: Self-pay

## 2023-08-23 ENCOUNTER — Other Ambulatory Visit: Payer: Self-pay | Admitting: *Deleted

## 2023-08-23 DIAGNOSIS — Z79899 Other long term (current) drug therapy: Secondary | ICD-10-CM

## 2023-08-23 DIAGNOSIS — E611 Iron deficiency: Secondary | ICD-10-CM

## 2023-08-23 DIAGNOSIS — M1990 Unspecified osteoarthritis, unspecified site: Secondary | ICD-10-CM

## 2023-08-24 LAB — CBC WITH DIFFERENTIAL/PLATELET
Absolute Lymphocytes: 1182 {cells}/uL (ref 850–3900)
Absolute Monocytes: 1055 {cells}/uL — ABNORMAL HIGH (ref 200–950)
Basophils Absolute: 90 {cells}/uL (ref 0–200)
Basophils Relative: 1.7 %
Eosinophils Absolute: 180 {cells}/uL (ref 15–500)
Eosinophils Relative: 3.4 %
HCT: 40.2 % (ref 38.5–50.0)
Hemoglobin: 13.7 g/dL (ref 13.2–17.1)
MCH: 32.9 pg (ref 27.0–33.0)
MCHC: 34.1 g/dL (ref 32.0–36.0)
MCV: 96.6 fL (ref 80.0–100.0)
MPV: 9.9 fL (ref 7.5–12.5)
Monocytes Relative: 19.9 %
Neutro Abs: 2793 {cells}/uL (ref 1500–7800)
Neutrophils Relative %: 52.7 %
Platelets: 379 10*3/uL (ref 140–400)
RBC: 4.16 10*6/uL — ABNORMAL LOW (ref 4.20–5.80)
RDW: 14.4 % (ref 11.0–15.0)
Total Lymphocyte: 22.3 %
WBC: 5.3 10*3/uL (ref 3.8–10.8)

## 2023-08-24 LAB — COMPLETE METABOLIC PANEL WITH GFR
AG Ratio: 2 (calc) (ref 1.0–2.5)
ALT: 19 U/L (ref 9–46)
AST: 18 U/L (ref 10–35)
Albumin: 4.5 g/dL (ref 3.6–5.1)
Alkaline phosphatase (APISO): 73 U/L (ref 35–144)
BUN: 16 mg/dL (ref 7–25)
CO2: 31 mmol/L (ref 20–32)
Calcium: 9.7 mg/dL (ref 8.6–10.3)
Chloride: 102 mmol/L (ref 98–110)
Creat: 1.19 mg/dL (ref 0.70–1.35)
Globulin: 2.3 g/dL (ref 1.9–3.7)
Glucose, Bld: 78 mg/dL (ref 65–99)
Potassium: 4.6 mmol/L (ref 3.5–5.3)
Sodium: 140 mmol/L (ref 135–146)
Total Bilirubin: 0.4 mg/dL (ref 0.2–1.2)
Total Protein: 6.8 g/dL (ref 6.1–8.1)
eGFR: 69 mL/min/{1.73_m2} (ref >=60)

## 2023-08-25 NOTE — Progress Notes (Signed)
 CBC and CMP are normal.

## 2023-09-09 ENCOUNTER — Other Ambulatory Visit (HOSPITAL_BASED_OUTPATIENT_CLINIC_OR_DEPARTMENT_OTHER): Payer: Self-pay

## 2023-09-22 ENCOUNTER — Other Ambulatory Visit: Payer: Self-pay | Admitting: Physician Assistant

## 2023-09-22 DIAGNOSIS — Z79899 Other long term (current) drug therapy: Secondary | ICD-10-CM

## 2023-09-23 ENCOUNTER — Other Ambulatory Visit (HOSPITAL_BASED_OUTPATIENT_CLINIC_OR_DEPARTMENT_OTHER): Payer: Self-pay

## 2023-09-23 ENCOUNTER — Other Ambulatory Visit: Payer: Self-pay

## 2023-09-23 MED ORDER — BD TB SYRINGE 27G X 1/2" 1 ML MISC
3 refills | Status: AC
  Filled 2023-09-23: qty 12, 84d supply, fill #0
  Filled 2023-12-12: qty 12, 84d supply, fill #1
  Filled 2024-02-29: qty 12, 84d supply, fill #2
  Filled 2024-06-06: qty 12, 84d supply, fill #3

## 2023-09-23 MED ORDER — METHOTREXATE SODIUM CHEMO INJECTION 50 MG/2ML
25.0000 mg | INTRAMUSCULAR | 0 refills | Status: DC
  Filled 2023-09-23: qty 4, 28d supply, fill #0

## 2023-09-23 NOTE — Addendum Note (Signed)
 Addended by: Henriette Combs on: 09/23/2023 10:16 AM   Modules accepted: Orders

## 2023-09-23 NOTE — Telephone Encounter (Signed)
 Last Fill: 08/21/2023 (MTX)  Labs: 08/23/2023 CBC and CMP are normal.   Next Visit: 10/14/2023  Last Visit: 05/10/2023  DX: Inflammatory arthritis   Current Dose per office note 05/10/2023: Methotrexate 1 mL sq injection every 7 days   Okay to refill Methotrexate and Syringes?

## 2023-09-30 NOTE — Progress Notes (Unsigned)
 Office Visit Note  Patient: Kenneth Payne             Date of Birth: 01-01-1961           MRN: [id number]             PCP: Jearldine Mina, MD Referring: Jearldine Mina, MD Visit Date: 10/14/2023 Occupation: @GUAROCC @  Subjective:  Pain in multiple joints   History of Present Illness: Kenneth Payne is a 63 y.o. male with history of inflammatory arthritis and ulcerative colitis.  Patient remains on  Xeljanz 10 mg 1 tablet by mouth twice daily-Prescribed by Dr. Tova Fresh, Methotrexate  1 mL sq injection every 7 days, and folic acid  1 mg 2 tablets daily.  He is tolerating combination therapy without any side effects and has not had any recent gaps in therapy.  Patient presents today with increased pain in his right shoulder, right index, and right hip joint.  He denies any injury prior to the onset of symptoms.  He denies any nocturnal pain.  He has not needed to take any over-the-counter products for symptomatic relief.  He has still been able to golf without difficulty but is concerned about the frequency of these episodes of pain.  His right hip pain is exacerbated by changing from a seated to standing position at which time he has a stabbing pain in his right hip.  He denies any groin pain while walking or pain on the lateral aspect of his right hip.  Patient states that at times with work he has to pull or lift heavy objects which exacerbated his right shoulder pain.  During these times he also has pain in the right index finger.  He denies any joint swelling.  He has not noticed any locking or triggering of the right index.  He has not yet seen an orthopedist.  He denies any gaps in therapy.  Activities of Daily Living:  Patient reports morning stiffness for 0 minutes.   Patient Denies nocturnal pain.  Difficulty dressing/grooming: Denies Difficulty climbing stairs: Denies Difficulty getting out of chair: Denies Difficulty using hands for taps, buttons, cutlery, and/or writing: Denies  Review of  Systems  Constitutional:  Negative for fatigue.  HENT:  Negative for mouth sores, mouth dryness and nose dryness.   Eyes:  Negative for pain and dryness.  Respiratory:  Negative for shortness of breath and difficulty breathing.   Cardiovascular:  Negative for chest pain and palpitations.  Gastrointestinal:  Negative for blood in stool, constipation and diarrhea.  Endocrine: Negative for increased urination.  Genitourinary:  Negative for involuntary urination.  Musculoskeletal:  Positive for joint pain, joint pain and muscle weakness. Negative for gait problem, joint swelling, myalgias, morning stiffness, muscle tenderness and myalgias.  Skin:  Negative for color change, rash, hair loss and sensitivity to sunlight.  Allergic/Immunologic: Negative for susceptible to infections.  Neurological:  Negative for dizziness and headaches.  Hematological:  Negative for swollen glands.  Psychiatric/Behavioral:  Negative for depressed mood and sleep disturbance. The patient is not nervous/anxious.     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   Rheumatoid arthritis (HCC)    High risk medication use 05/25/2016   Pain, neck 05/25/2016   Arthralgia of both hands 05/25/2016   Pain of both sacroiliac joints 05/25/2016   Neck mass    Thrombocytosis 08/20/2013   Normocytic anemia 08/19/2013   Ulcerative  colitis (HCC) 08/16/2013   Ulcerative colitis, acute (HCC) 08/16/2013   History of leukocytosis 08/16/2013    Past Medical History:  Diagnosis Date   Hypertension    Rheumatoid arthritis (HCC)    Ulcerative colitis (HCC)     Family History  Problem Relation Age of Onset   Asthma Mother    Hypertension Mother    Parkinson's disease Father    Hypertension Father    Stroke Father    Heart attack Maternal Grandmother    Heart attack Maternal Grandfather    Stroke Paternal Grandmother     Heart attack Paternal Grandfather    Asthma Daughter    Past Surgical History:  Procedure Laterality Date   COLONOSCOPY N/A 08/18/2013   Procedure: COLONOSCOPY;  Surgeon: Venson Ginger., MD;  Location: [facility];  Service: Endoscopy;  Laterality: N/A;   CYST REMOVAL NECK     HERNIA REPAIR     2009   MOLE REMOVAL Right    knee   MOLE REMOVAL Left    shoulder   SQUAMOUS CELL CARCINOMA EXCISION     left side collar bone area- Dr. Del Favia, dermatologist. 02/2023   VASECTOMY     Social History   Social History Narrative   Not on file   Immunization History  Administered Date(s) Administered   PFIZER(Purple Top)SARS-COV-2 Vaccination 09/04/2019, 09/28/2019   Zoster Recombinant(Shingrix) 07/10/2018     Objective: Vital Signs: BP 135/83 (BP Location: Left Arm, Patient Position: Sitting, Cuff Size: Normal)   Pulse 83   Resp 15   Ht 5\' 7"  (1.702 m)   Wt 180 lb (81.6 kg)   BMI 28.19 kg/m    Physical Exam Vitals and nursing note reviewed.  Constitutional:      Appearance: He is well-developed.  HENT:     Head: Normocephalic and atraumatic.  Eyes:     Conjunctiva/sclera: Conjunctivae normal.     Pupils: Pupils are equal, round, and reactive to light.  Cardiovascular:     Rate and Rhythm: Normal rate and regular rhythm.     Heart sounds: Normal heart sounds.  Pulmonary:     Effort: Pulmonary effort is normal.     Breath sounds: Normal breath sounds.  Abdominal:     General: Bowel sounds are normal.     Palpations: Abdomen is soft.  Musculoskeletal:     Cervical back: Normal range of motion and neck supple.  Skin:    General: Skin is warm and dry.     Capillary Refill: Capillary refill takes less than 2 seconds.  Neurological:     Mental Status: He is alert and oriented to person, place, and time.  Psychiatric:        Behavior: Behavior normal.      Musculoskeletal Exam: C-spine, thoracic spine, lumbar spine have good range of motion.  No midline spinal  tenderness.  No SI joint tenderness.  Shoulder joints have good range of motion.  Elbow joints have good range of motion with no tenderness along the joint line.  Wrist joints, MCPs, PIPs, DIPs have good range of motion with no synovitis.  No tenderness over the right second MCP joint noted.  No tenderness along the flexor tendon noted.  PIP and DIP prominence consistent with osteoarthritic changes noted.  Hip joints have good range of motion with no reproducible pain in the right hip at this time.  Negative straight leg raise.  No tenderness over the trochanteric bursa.  Knee joints have good range of motion  with no warmth or effusion.  Ankle joints have good range of motion with no tenderness or joint swelling.  No evidence of Achilles tendinitis.  CDAI Exam: CDAI Score: -- Patient Global: --; Provider Global: -- Swollen: --; Tender: -- Joint Exam 10/14/2023   No joint exam has been documented for this visit   There is currently no information documented on the homunculus. Go to the Rheumatology activity and complete the homunculus joint exam.  Investigation: No additional findings.  Imaging: No results found.  Recent Labs: Lab Results  Component Value Date   WBC 5.3 08/23/2023   HGB 13.7 08/23/2023   PLT 379 08/23/2023   NA 140 08/23/2023   K 4.6 08/23/2023   CL 102 08/23/2023   CO2 31 08/23/2023   GLUCOSE 78 08/23/2023   BUN 16 08/23/2023   CREATININE 1.19 08/23/2023   BILITOT 0.4 08/23/2023   ALKPHOS 67 06/08/2019   AST 18 08/23/2023   ALT 19 08/23/2023   PROT 6.8 08/23/2023   ALBUMIN 4.9 06/08/2019   CALCIUM 9.7 08/23/2023   GFRAA 69 11/23/2020   QFTBGOLDPLUS NEGATIVE 05/10/2023    Speciality Comments: Please ensure TB Gold is drawn yearly.  Procedures:  No procedures performed Allergies: Augmentin [amoxicillin-pot clavulanate] and Flagyl [metronidazole]   Assessment / Plan:     Visit Diagnoses: Inflammatory arthritis: He has no synovitis or dactylitis on  examination today.  No midline spinal tenderness.  No SI joint tenderness.  He has occasional episodes of plantar fasciitis but no Achilles tendinitis.  Patient is currently taking Xeljanz 10 mg 1 tablet by mouth twice daily and methotrexate  1 mL sq injections once weekly.  He is tolerating combination therapy without any side effects and has not had any gaps in therapy recently.  He has not been experiencing any nocturnal pain, morning stiffness, or difficulty with ADLs.  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  Over the past 6 months he has had intermittent discomfort involving his right shoulder, right index finger, and right hip.  No injury prior to the onset of symptoms.  He has not noticed any joint swelling.  His range of motion has been well-preserved and he has been able to still play golf.  He has not been taking any over-the-counter products for symptomatic relief.  X-rays of the right shoulder, right hand, and right hip were obtained today for further evaluation.  If his symptoms persist or worsen he may return for a right shoulder injection or an ultrasound-guided right 2nd MCP injection.  If his hip pain persists or worsens I would recommend evaluation by orthopedics. He will remain on the current treatment regimen for now.  He was advised to notify us  if he develops any new or worsening symptoms.  He will follow-up in the office in 5 months or sooner if needed.  High risk medication use - Xeljanz 10 mg 1 tablet by mouth twice daily-Prescribed by Dr. Tova Fresh, Methotrexate  1 mL sq injection every 7 days, and folic acid  1 mg 2 tablets daily.  CBC and CMP updated on 08/23/23. His next lab work will be due in May and every 3 months.  TB gold negative on 05/10/23. No recent or recurrent infections.  Advised the patient to hold xeljanz and methotrexate  if he develops signs or symptoms of an infection and to resume once the infection has completely cleared.   -  Plan: CBC with Differential/Platelet, Comprehensive metabolic panel with GFR  Other  ulcerative colitis with other complication (HCC) - Colonoscopy updated on 09/07/2022 by Dr. Tova Fresh.  He has not had any signs or symptoms of a flare.  He remains on Xeljanz 10 mg twice daily prescribed by Dr. Tova Fresh.  Chronic right shoulder pain - He has been experiencing intermittent discomfort in the right shoulder particularly if lifting or pulling heavy objects.  He has not had any nocturnal pain and has full range of motion on examination today.  X-rays of the right shoulder obtained today for further evaluation.  If his symptoms persist or worsen he can return for a right glenohumeral joint injection or we can place a referral to physical therapy.  If his symptoms continue to persist an MRI of the right shoulder can be ordered in the future if needed.  Plan: XR Shoulder Right  Pain in right hand -He been experiencing intermittent discomfort in his right index finger for little over 6 months.  No injury prior to the onset of symptoms.  No locking or triggering. He has been able to golf without difficulty.  He has not noticed any joint swelling or nocturnal pain.  He has soreness of the right second MCP joint intermittently but has no active inflammation at this time.  X-rays of the right hand were obtained for further evaluation.  If his symptoms persist or worsen an ultrasound-guided right second MCP joint cortisone injection can be performed.  Plan: XR Hand 2 View Right  Pain in right hip - He has been experiencing intermittent discomfort in his right hip particularly when rising from a seated to standing position.  He experiences a sharp stabbing pain in his right hip with positional changes but has not had any nocturnal pain or difficulty performing ADLs.  He has been able to walk without difficulty as well as play golf without pain.  On examination he has good range of motion of the right hip joint with no groin pain.   No tenderness over the trochanteric bursa noted.  X-rays of the right hip updated today.  If his symptoms persist or worsen recommended following up with orthopedics for further evaluation.  Plan: XR HIP UNILAT W OR W/O PELVIS 2-3 VIEWS RIGHT  Chronic pain of right knee: No warmth or effusion noted.   Trigger middle finger of right hand: Intermittent locking.    DDD (degenerative disc disease), cervical: C-spine has good ROM with no discomfort or symptoms of radiculopathy at this time.   Other medical conditions are listed as follows:  Mixed hyperlipidemia  Primary hypertension: Blood pressure was 135/83 today in the office.  Iron deficiency  Orders: Orders Placed This Encounter  Procedures   XR Shoulder Right   XR HIP UNILAT W OR W/O PELVIS 2-3 VIEWS RIGHT   XR Hand 2 View Right   CBC with Differential/Platelet   Comprehensive metabolic panel with GFR   No orders of the defined types were placed in this encounter.     Follow-Up Instructions: Return in about 5 months (around 03/15/2024) for Inflammatory arthritis, UC .   Romayne Clubs, PA-C  Note - This record has been created using Dragon software.  Chart creation errors have been sought, but may not always  have been located. Such creation errors do not reflect on  the standard of medical care.

## 2023-10-03 ENCOUNTER — Other Ambulatory Visit (HOSPITAL_BASED_OUTPATIENT_CLINIC_OR_DEPARTMENT_OTHER): Payer: Self-pay

## 2023-10-03 MED ORDER — AZITHROMYCIN 250 MG PO TABS
ORAL_TABLET | ORAL | 0 refills | Status: AC
  Filled 2023-10-03: qty 6, 5d supply, fill #0

## 2023-10-14 ENCOUNTER — Other Ambulatory Visit: Payer: Self-pay

## 2023-10-14 ENCOUNTER — Ambulatory Visit (INDEPENDENT_AMBULATORY_CARE_PROVIDER_SITE_OTHER): Payer: PRIVATE HEALTH INSURANCE

## 2023-10-14 ENCOUNTER — Encounter: Payer: Self-pay | Admitting: Physician Assistant

## 2023-10-14 ENCOUNTER — Ambulatory Visit: Payer: PRIVATE HEALTH INSURANCE | Attending: Physician Assistant | Admitting: Physician Assistant

## 2023-10-14 ENCOUNTER — Other Ambulatory Visit (HOSPITAL_BASED_OUTPATIENT_CLINIC_OR_DEPARTMENT_OTHER): Payer: Self-pay

## 2023-10-14 VITALS — BP 135/83 | HR 83 | Resp 15 | Ht 67.0 in | Wt 180.0 lb

## 2023-10-14 DIAGNOSIS — K51818 Other ulcerative colitis with other complication: Secondary | ICD-10-CM

## 2023-10-14 DIAGNOSIS — G8929 Other chronic pain: Secondary | ICD-10-CM

## 2023-10-14 DIAGNOSIS — M5030 Other cervical disc degeneration, unspecified cervical region: Secondary | ICD-10-CM

## 2023-10-14 DIAGNOSIS — M79641 Pain in right hand: Secondary | ICD-10-CM

## 2023-10-14 DIAGNOSIS — M25511 Pain in right shoulder: Secondary | ICD-10-CM

## 2023-10-14 DIAGNOSIS — I10 Essential (primary) hypertension: Secondary | ICD-10-CM

## 2023-10-14 DIAGNOSIS — M1990 Unspecified osteoarthritis, unspecified site: Secondary | ICD-10-CM

## 2023-10-14 DIAGNOSIS — Z79899 Other long term (current) drug therapy: Secondary | ICD-10-CM | POA: Diagnosis not present

## 2023-10-14 DIAGNOSIS — M25551 Pain in right hip: Secondary | ICD-10-CM | POA: Diagnosis not present

## 2023-10-14 DIAGNOSIS — E782 Mixed hyperlipidemia: Secondary | ICD-10-CM

## 2023-10-14 DIAGNOSIS — E611 Iron deficiency: Secondary | ICD-10-CM

## 2023-10-14 DIAGNOSIS — M25561 Pain in right knee: Secondary | ICD-10-CM

## 2023-10-14 DIAGNOSIS — M65331 Trigger finger, right middle finger: Secondary | ICD-10-CM

## 2023-10-14 MED ORDER — METHOTREXATE SODIUM CHEMO INJECTION 50 MG/2ML
25.0000 mg | INTRAMUSCULAR | 0 refills | Status: DC
Start: 2023-10-14 — End: 2024-02-26
  Filled 2023-10-14: qty 12, 84d supply, fill #0

## 2023-10-14 NOTE — Patient Instructions (Signed)
 Standing Labs We placed an order today for your standing lab work.   Please have your standing labs drawn at end of May and every 3 months   Please have your labs drawn 2 weeks prior to your appointment so that the provider can discuss your lab results at your appointment, if possible.  Please note that you may see your imaging and lab results in MyChart before we have reviewed them. We will contact you once all results are reviewed. Please allow our office up to 72 hours to thoroughly review all of the results before contacting the office for clarification of your results.  WALK-IN LAB HOURS  Monday through Thursday from 8:00 am -12:30 pm and 1:00 pm-5:00 pm and Friday from 8:00 am-12:00 pm.  Patients with office visits requiring labs will be seen before walk-in labs.  You may encounter longer than normal wait times. Please allow additional time. Wait times may be shorter on  Monday and Thursday afternoons.  We do not book appointments for walk-in labs. We appreciate your patience and understanding with our staff.   Labs are drawn by [facility]. Please bring your co-pay at the time of your lab draw.  You may receive a bill from [facility] for your lab work.  Please note if you are on Hydroxychloroquine and and an order has been placed for a Hydroxychloroquine level,  you will need to have it drawn 4 hours or more after your last dose.  If you wish to have your labs drawn at another location, please call the office 24 hours in advance so we can fax the orders.  The office is located at [address], [place], [place] [address]   If you have any questions regarding directions or hours of operation,  please call [phone].   As a reminder, please drink plenty of water prior to coming for your lab work. Thanks!

## 2023-10-14 NOTE — Telephone Encounter (Signed)
 Please review and sign pended methotrexate  rx. Thanks!

## 2023-10-15 NOTE — Progress Notes (Signed)
 X-rays of the right hands are consistent with osteoarthritis.  No erosive changes noted.   X-rays of the right shoulder and right hip are unremarkable. Please notify the patient.

## 2023-12-12 ENCOUNTER — Other Ambulatory Visit (HOSPITAL_BASED_OUTPATIENT_CLINIC_OR_DEPARTMENT_OTHER): Payer: Self-pay

## 2023-12-12 ENCOUNTER — Other Ambulatory Visit: Payer: Self-pay

## 2024-01-10 ENCOUNTER — Other Ambulatory Visit (HOSPITAL_BASED_OUTPATIENT_CLINIC_OR_DEPARTMENT_OTHER): Payer: Self-pay

## 2024-01-10 MED ORDER — AZITHROMYCIN 250 MG PO TABS
ORAL_TABLET | ORAL | 0 refills | Status: AC
  Filled 2024-01-10: qty 6, 5d supply, fill #0

## 2024-01-10 MED ORDER — HYDROCODONE BIT-HOMATROP MBR 5-1.5 MG/5ML PO SOLN
5.0000 mL | Freq: Four times a day (QID) | ORAL | 0 refills | Status: AC | PRN
  Filled 2024-01-10: qty 140, 7d supply, fill #0

## 2024-02-26 ENCOUNTER — Other Ambulatory Visit (HOSPITAL_BASED_OUTPATIENT_CLINIC_OR_DEPARTMENT_OTHER): Payer: Self-pay

## 2024-02-26 ENCOUNTER — Other Ambulatory Visit: Payer: Self-pay | Admitting: Physician Assistant

## 2024-02-26 MED ORDER — METHOTREXATE SODIUM CHEMO INJECTION 50 MG/2ML
25.0000 mg | INTRAMUSCULAR | 0 refills | Status: DC
  Filled 2024-02-26: qty 4, 28d supply, fill #0

## 2024-02-26 NOTE — Telephone Encounter (Signed)
 Last Fill: 10/14/2023  Labs: 08/23/2023 CBC and CMP are normal.   Next Visit: 03/16/2024  Last Visit: 10/14/2023  DX: Inflammatory arthritis   Current Dose per office note 10/14/2023: Methotrexate  1 mL sq injection every 7 days   Okay to refill Methotrexate ?  LMOM for patient to update labs, provided lab hours.

## 2024-02-26 NOTE — Telephone Encounter (Signed)
 Patient called the office to advise he would try to come in today for his labs to be updated

## 2024-03-02 ENCOUNTER — Other Ambulatory Visit (HOSPITAL_BASED_OUTPATIENT_CLINIC_OR_DEPARTMENT_OTHER): Payer: Self-pay

## 2024-03-02 NOTE — Progress Notes (Deleted)
 Office Visit Note  Patient: Kenneth Payne             Date of Birth: 12-16-1960           MRN: [id number]             PCP: Ransom Other, MD Referring: Ransom Other, MD Visit Date: 03/16/2024 Occupation: @GUAROCC @  Subjective:  No chief complaint on file.   History of Present Illness: Kenneth Payne is a 63 y.o. male ***     Activities of Daily Living:  Patient reports morning stiffness for *** {minute/hour:19697}.   Patient {ACTIONS;DENIES/REPORTS:21021675::Denies} nocturnal pain.  Difficulty dressing/grooming: {ACTIONS;DENIES/REPORTS:21021675::Denies} Difficulty climbing stairs: {ACTIONS;DENIES/REPORTS:21021675::Denies} Difficulty getting out of chair: {ACTIONS;DENIES/REPORTS:21021675::Denies} Difficulty using hands for taps, buttons, cutlery, and/or writing: {ACTIONS;DENIES/REPORTS:21021675::Denies}  No Rheumatology ROS completed.   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   Rheumatoid arthritis (HCC)    High risk medication use 05/25/2016   Pain, neck 05/25/2016   Arthralgia of both hands 05/25/2016   Pain of both sacroiliac joints 05/25/2016   Neck mass    Thrombocytosis 08/20/2013   Normocytic anemia 08/19/2013   Ulcerative colitis (HCC) 08/16/2013   Ulcerative colitis, acute (HCC) 08/16/2013   History of leukocytosis 08/16/2013    Past Medical History:  Diagnosis Date   Hypertension    Rheumatoid arthritis (HCC)    Ulcerative colitis (HCC)     Family History  Problem Relation Age of Onset   Asthma Mother    Hypertension Mother    Parkinson's disease Father    Hypertension Father    Stroke Father    Heart attack Maternal Grandmother    Heart attack Maternal Grandfather    Stroke Paternal Grandmother    Heart attack Paternal Grandfather    Asthma Daughter    Past Surgical History:  Procedure Laterality Date   COLONOSCOPY N/A  08/18/2013   Procedure: COLONOSCOPY;  Surgeon: Lynwood LITTIE Celestia Mickey., MD;  Location: [facility];  Service: Endoscopy;  Laterality: N/A;   CYST REMOVAL NECK     HERNIA REPAIR     2009   MOLE REMOVAL Right    knee   MOLE REMOVAL Left    shoulder   SQUAMOUS CELL CARCINOMA EXCISION     left side collar bone area- Dr. Shona, dermatologist. 02/2023   VASECTOMY     Social History   Social History Narrative   Not on file   Immunization History  Administered Date(s) Administered   PFIZER(Purple Top)SARS-COV-2 Vaccination 09/04/2019, 09/28/2019   Zoster Recombinant(Shingrix) 07/10/2018     Objective: Vital Signs: There were no vitals taken for this visit.   Physical Exam   Musculoskeletal Exam: ***  CDAI Exam: CDAI Score: -- Patient Global: --; Provider Global: -- Swollen: --; Tender: -- Joint Exam 03/16/2024   No joint exam has been documented for this visit   There is currently no information documented on the homunculus. Go to the Rheumatology activity and complete the homunculus joint exam.  Investigation: No additional findings.  Imaging: No results found.  Recent Labs: Lab Results  Component Value Date   WBC 5.3 08/23/2023   HGB 13.7 08/23/2023   PLT 379 08/23/2023   NA 140 08/23/2023   K 4.6 08/23/2023   CL 102 08/23/2023   CO2 31 08/23/2023   GLUCOSE 78 08/23/2023   BUN 16 08/23/2023   CREATININE 1.19 08/23/2023   BILITOT 0.4  08/23/2023   ALKPHOS 67 06/08/2019   AST 18 08/23/2023   ALT 19 08/23/2023   PROT 6.8 08/23/2023   ALBUMIN 4.9 06/08/2019   CALCIUM 9.7 08/23/2023   GFRAA 69 11/23/2020   QFTBGOLDPLUS NEGATIVE 05/10/2023    Speciality Comments: Please ensure TB Gold is drawn yearly.  Procedures:  No procedures performed Allergies: Augmentin [amoxicillin-pot clavulanate] and Flagyl [metronidazole]   Assessment / Plan:     Visit Diagnoses: Inflammatory arthritis  High risk medication use  Iron deficiency  Other ulcerative colitis with  other complication (HCC)  Chronic pain of right knee  Trigger middle finger of right hand  DDD (degenerative disc disease), cervical  Mixed hyperlipidemia  Primary hypertension  Pain in right hip  Orders: No orders of the defined types were placed in this encounter.  No orders of the defined types were placed in this encounter.   Face-to-face time spent with patient was *** minutes. Greater than 50% of time was spent in counseling and coordination of care.  Follow-Up Instructions: No follow-ups on file.   Waddell CHRISTELLA Craze, PA-C  Note - This record has been created using Dragon software.  Chart creation errors have been sought, but may not always  have been located. Such creation errors do not reflect on  the standard of medical care.

## 2024-03-05 ENCOUNTER — Other Ambulatory Visit (HOSPITAL_BASED_OUTPATIENT_CLINIC_OR_DEPARTMENT_OTHER): Payer: Self-pay

## 2024-03-05 ENCOUNTER — Other Ambulatory Visit: Payer: Self-pay | Admitting: *Deleted

## 2024-03-05 DIAGNOSIS — Z79899 Other long term (current) drug therapy: Secondary | ICD-10-CM

## 2024-03-05 LAB — CBC WITH DIFFERENTIAL/PLATELET
Absolute Lymphocytes: 1058 {cells}/uL (ref 850–3900)
Absolute Monocytes: 717 {cells}/uL (ref 200–950)
Basophils Absolute: 43 {cells}/uL (ref 0–200)
Basophils Relative: 0.6 %
Eosinophils Absolute: 99 {cells}/uL (ref 15–500)
Eosinophils Relative: 1.4 %
HCT: 41.6 % (ref 38.5–50.0)
Hemoglobin: 13.8 g/dL (ref 13.2–17.1)
MCH: 32.8 pg (ref 27.0–33.0)
MCHC: 33.2 g/dL (ref 32.0–36.0)
MCV: 98.8 fL (ref 80.0–100.0)
MPV: 10.7 fL (ref 7.5–12.5)
Monocytes Relative: 10.1 %
Neutro Abs: 5183 {cells}/uL (ref 1500–7800)
Neutrophils Relative %: 73 %
Platelets: 402 Thousand/uL — ABNORMAL HIGH (ref 140–400)
RBC: 4.21 Million/uL (ref 4.20–5.80)
RDW: 14.9 % (ref 11.0–15.0)
Total Lymphocyte: 14.9 %
WBC: 7.1 Thousand/uL (ref 3.8–10.8)

## 2024-03-05 LAB — COMPREHENSIVE METABOLIC PANEL WITH GFR
AG Ratio: 1.7 (calc) (ref 1.0–2.5)
ALT: 15 U/L (ref 9–46)
AST: 16 U/L (ref 10–35)
Albumin: 4.5 g/dL (ref 3.6–5.1)
Alkaline phosphatase (APISO): 74 U/L (ref 35–144)
BUN: 14 mg/dL (ref 7–25)
CO2: 31 mmol/L (ref 20–32)
Calcium: 10 mg/dL (ref 8.6–10.3)
Chloride: 99 mmol/L (ref 98–110)
Creat: 1.18 mg/dL (ref 0.70–1.35)
Globulin: 2.6 g/dL (ref 1.9–3.7)
Glucose, Bld: 86 mg/dL (ref 65–99)
Potassium: 4.3 mmol/L (ref 3.5–5.3)
Sodium: 138 mmol/L (ref 135–146)
Total Bilirubin: 0.5 mg/dL (ref 0.2–1.2)
Total Protein: 7.1 g/dL (ref 6.1–8.1)
eGFR: 70 mL/min/1.73m2 (ref >=60)

## 2024-03-05 MED ORDER — TOBRAMYCIN 0.3 % OP SOLN
1.0000 [drp] | Freq: Four times a day (QID) | OPHTHALMIC | 0 refills | Status: AC
  Filled 2024-03-05: qty 5, 13d supply, fill #0

## 2024-03-06 ENCOUNTER — Ambulatory Visit: Payer: Self-pay | Admitting: Rheumatology

## 2024-03-06 NOTE — Progress Notes (Signed)
 CBC and CMP are stable.  Platelets are mildly elevated.  Will continue to monitor.

## 2024-03-16 ENCOUNTER — Ambulatory Visit: Payer: PRIVATE HEALTH INSURANCE | Admitting: Physician Assistant

## 2024-03-17 NOTE — Progress Notes (Deleted)
 Office Visit Note  Patient: Kenneth Payne             Date of Birth: 1960/08/18           MRN: [id number]             PCP: Ransom Other, MD Referring: Ransom Other, MD Visit Date: 03/31/2024 Occupation: @GUAROCC @  Subjective:  No chief complaint on file.   History of Present Illness: Kenneth Payne is a 63 y.o. male ***     Activities of Daily Living:  Patient reports morning stiffness for *** {minute/hour:19697}.   Patient {ACTIONS;DENIES/REPORTS:21021675::Denies} nocturnal pain.  Difficulty dressing/grooming: {ACTIONS;DENIES/REPORTS:21021675::Denies} Difficulty climbing stairs: {ACTIONS;DENIES/REPORTS:21021675::Denies} Difficulty getting out of chair: {ACTIONS;DENIES/REPORTS:21021675::Denies} Difficulty using hands for taps, buttons, cutlery, and/or writing: {ACTIONS;DENIES/REPORTS:21021675::Denies}  No Rheumatology ROS completed.   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   Rheumatoid arthritis (HCC)    High risk medication use 05/25/2016   Pain, neck 05/25/2016   Arthralgia of both hands 05/25/2016   Pain of both sacroiliac joints 05/25/2016   Neck mass    Thrombocytosis 08/20/2013   Normocytic anemia 08/19/2013   Ulcerative colitis (HCC) 08/16/2013   Ulcerative colitis, acute (HCC) 08/16/2013   History of leukocytosis 08/16/2013    Past Medical History:  Diagnosis Date   Hypertension    Rheumatoid arthritis (HCC)    Ulcerative colitis (HCC)     Family History  Problem Relation Age of Onset   Asthma Mother    Hypertension Mother    Parkinson's disease Father    Hypertension Father    Stroke Father    Heart attack Maternal Grandmother    Heart attack Maternal Grandfather    Stroke Paternal Grandmother    Heart attack Paternal Grandfather    Asthma Daughter    Past Surgical History:  Procedure Laterality Date   COLONOSCOPY N/A  08/18/2013   Procedure: COLONOSCOPY;  Surgeon: Lynwood LITTIE Celestia Mickey., MD;  Location: [facility];  Service: Endoscopy;  Laterality: N/A;   CYST REMOVAL NECK     HERNIA REPAIR     2009   MOLE REMOVAL Right    knee   MOLE REMOVAL Left    shoulder   SQUAMOUS CELL CARCINOMA EXCISION     left side collar bone area- Dr. Shona, dermatologist. 02/2023   VASECTOMY     Social History   Social History Narrative   Not on file   Immunization History  Administered Date(s) Administered   PFIZER(Purple Top)SARS-COV-2 Vaccination 09/04/2019, 09/28/2019   Zoster Recombinant(Shingrix) 07/10/2018     Objective: Vital Signs: There were no vitals taken for this visit.   Physical Exam   Musculoskeletal Exam: ***  CDAI Exam: CDAI Score: -- Patient Global: --; Provider Global: -- Swollen: --; Tender: -- Joint Exam 03/31/2024   No joint exam has been documented for this visit   There is currently no information documented on the homunculus. Go to the Rheumatology activity and complete the homunculus joint exam.  Investigation: No additional findings.  Imaging: No results found.  Recent Labs: Lab Results  Component Value Date   WBC 7.1 03/05/2024   HGB 13.8 03/05/2024   PLT 402 (H) 03/05/2024   NA 138 03/05/2024   K 4.3 03/05/2024   CL 99 03/05/2024   CO2 31 03/05/2024   GLUCOSE 86 03/05/2024   BUN 14 03/05/2024   CREATININE 1.18 03/05/2024   BILITOT  0.5 03/05/2024   ALKPHOS 67 06/08/2019   AST 16 03/05/2024   ALT 15 03/05/2024   PROT 7.1 03/05/2024   ALBUMIN 4.9 06/08/2019   CALCIUM 10.0 03/05/2024   GFRAA 69 11/23/2020   QFTBGOLDPLUS NEGATIVE 05/10/2023    Speciality Comments: Please ensure TB Gold is drawn yearly.  Procedures:  No procedures performed Allergies: Augmentin [amoxicillin-pot clavulanate] and Flagyl [metronidazole]   Assessment / Plan:     Visit Diagnoses: Inflammatory arthritis  High risk medication use  Other ulcerative colitis with other  complication (HCC)  Iron deficiency  Pain in right hand  Chronic pain of right knee  Trigger middle finger of right hand  DDD (degenerative disc disease), cervical  Mixed hyperlipidemia  Primary hypertension  Pain in right hip  Orders: No orders of the defined types were placed in this encounter.  No orders of the defined types were placed in this encounter.   Face-to-face time spent with patient was *** minutes. Greater than 50% of time was spent in counseling and coordination of care.  Follow-Up Instructions: No follow-ups on file.   Waddell CHRISTELLA Craze, PA-C  Note - This record has been created using Dragon software.  Chart creation errors have been sought, but may not always  have been located. Such creation errors do not reflect on  the standard of medical care.

## 2024-03-20 ENCOUNTER — Other Ambulatory Visit (HOSPITAL_BASED_OUTPATIENT_CLINIC_OR_DEPARTMENT_OTHER): Payer: Self-pay

## 2024-03-20 MED ORDER — NEOMYCIN-POLYMYXIN-DEXAMETH 0.1 % OP SUSP
OPHTHALMIC | 0 refills | Status: AC
  Filled 2024-03-20: qty 5, 14d supply, fill #0

## 2024-03-31 ENCOUNTER — Ambulatory Visit: Payer: PRIVATE HEALTH INSURANCE | Admitting: Physician Assistant

## 2024-03-31 DIAGNOSIS — M25551 Pain in right hip: Secondary | ICD-10-CM

## 2024-03-31 DIAGNOSIS — K51818 Other ulcerative colitis with other complication: Secondary | ICD-10-CM

## 2024-03-31 DIAGNOSIS — M5030 Other cervical disc degeneration, unspecified cervical region: Secondary | ICD-10-CM

## 2024-03-31 DIAGNOSIS — I10 Essential (primary) hypertension: Secondary | ICD-10-CM

## 2024-03-31 DIAGNOSIS — Z79899 Other long term (current) drug therapy: Secondary | ICD-10-CM

## 2024-03-31 DIAGNOSIS — E611 Iron deficiency: Secondary | ICD-10-CM

## 2024-03-31 DIAGNOSIS — E782 Mixed hyperlipidemia: Secondary | ICD-10-CM

## 2024-03-31 DIAGNOSIS — M79641 Pain in right hand: Secondary | ICD-10-CM

## 2024-03-31 DIAGNOSIS — M65331 Trigger finger, right middle finger: Secondary | ICD-10-CM

## 2024-03-31 DIAGNOSIS — G8929 Other chronic pain: Secondary | ICD-10-CM

## 2024-03-31 DIAGNOSIS — M1990 Unspecified osteoarthritis, unspecified site: Secondary | ICD-10-CM

## 2024-04-06 NOTE — Progress Notes (Unsigned)
 Office Visit Note  Patient: Kenneth Payne             Date of Birth: December 08, 1960           MRN: [id number]             PCP: Ransom Other, MD Referring: Ransom Other, MD Visit Date: 04/20/2024 Occupation: @GUAROCC @  Subjective:  Medication monitoring  History of Present Illness: Kenneth Payne is a 63 y.o. male with history of inflammatory arthritis and ulcerative colitis.  Patient remains on Xeljanz 5 mg 1 tablet by mouth twice daily-Prescribed by Dr. Kristie, Methotrexate  1 mL sq injection every 7 days, and folic acid  1 mg 2 tablets daily.  He is tolerating combination therapy without any side effects and has not had any recent gaps in therapy.  Patient continues to experience intermittent discomfort in both shoulders.  He wakes up with discomfort in his shoulders at times.  He has also had intermittent discomfort on the lateral aspect of the right hip especially after sitting for prolonged periods of time and going to change positions.  He denies any groin pain.  He denies SI joint pain.  He denies any Achilles tendinitis or plantar fasciitis.  He continues to experience locking and tenderness of the right middle finger but is not yet ready to proceed with a cortisone injection. He denies any signs or symptoms of an ulcerative colitis flare.  He will be having an updated colonoscopy for surveillance in March 2026. Patient states that he was diagnosed with laryngitis in July 2025.  He was treated with a Z-Pak and a cough medication, which resolved his symptoms. He denies any recurrent infections.  He received the annual flu shot yesterday.      Activities of Daily Living:  Patient reports morning stiffness for less than 1 minute.   Patient Denies nocturnal pain.  Difficulty dressing/grooming: Denies Difficulty climbing stairs: Denies Difficulty getting out of chair: Denies Difficulty using hands for taps, buttons, cutlery, and/or writing: Denies  Review of Systems  Constitutional:   Negative for fatigue.  HENT:  Negative for mouth sores and mouth dryness.   Eyes:  Negative for dryness.  Respiratory:  Negative for shortness of breath.   Cardiovascular:  Negative for chest pain and palpitations.  Gastrointestinal:  Negative for blood in stool, constipation and diarrhea.  Endocrine: Negative for increased urination.  Genitourinary:  Negative for involuntary urination.  Musculoskeletal:  Positive for morning stiffness. Negative for joint pain, gait problem, joint pain, joint swelling, myalgias, muscle weakness, muscle tenderness and myalgias.  Skin:  Negative for color change, rash, hair loss and sensitivity to sunlight.  Allergic/Immunologic: Negative for susceptible to infections.  Neurological:  Negative for dizziness and headaches.  Hematological:  Negative for swollen glands.  Psychiatric/Behavioral:  Negative for depressed mood and sleep disturbance. The patient is not nervous/anxious.     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   Rheumatoid arthritis (HCC)    High risk medication use 05/25/2016   Pain, neck 05/25/2016   Arthralgia of both hands 05/25/2016   Pain of both sacroiliac joints 05/25/2016   Neck mass    Thrombocytosis 08/20/2013   Normocytic anemia 08/19/2013   Ulcerative colitis (HCC) 08/16/2013   Ulcerative colitis, acute (HCC) 08/16/2013   History of leukocytosis 08/16/2013    Past Medical History:  Diagnosis Date   Hypertension  Rheumatoid arthritis (HCC)    Ulcerative colitis (HCC)     Family History  Problem Relation Age of Onset   Asthma Mother    Hypertension Mother    Parkinson's disease Father    Hypertension Father    Stroke Father    Heart attack Maternal Grandmother    Heart attack Maternal Grandfather    Stroke Paternal Grandmother    Heart attack Paternal Grandfather    Asthma Daughter    Past Surgical History:   Procedure Laterality Date   COLONOSCOPY N/A 08/18/2013   Procedure: COLONOSCOPY;  Surgeon: Lynwood LITTIE Celestia Mickey., MD;  Location: [facility];  Service: Endoscopy;  Laterality: N/A;   CYST REMOVAL NECK     HERNIA REPAIR     2009   MOLE REMOVAL Right    knee   MOLE REMOVAL Left    shoulder   SQUAMOUS CELL CARCINOMA EXCISION     left side collar bone area- Dr. Shona, dermatologist. 02/2023   VASECTOMY     Social History   Social History Narrative   Not on file   Immunization History  Administered Date(s) Administered   PFIZER(Purple Top)SARS-COV-2 Vaccination 09/04/2019, 09/28/2019   Zoster Recombinant(Shingrix) 07/10/2018     Objective: Vital Signs: BP 130/82   Pulse 93   Temp 98.2 F (36.8 C)   Resp 14   Ht 5' 7 (1.702 m)   Wt 186 lb (84.4 kg)   BMI 29.13 kg/m    Physical Exam Vitals and nursing note reviewed.  Constitutional:      Appearance: He is well-developed.  HENT:     Head: Normocephalic and atraumatic.  Eyes:     Conjunctiva/sclera: Conjunctivae normal.     Pupils: Pupils are equal, round, and reactive to light.  Cardiovascular:     Rate and Rhythm: Normal rate and regular rhythm.     Heart sounds: Normal heart sounds.  Pulmonary:     Effort: Pulmonary effort is normal.     Breath sounds: Normal breath sounds.  Abdominal:     General: Bowel sounds are normal.     Palpations: Abdomen is soft.  Musculoskeletal:     Cervical back: Normal range of motion and neck supple.  Skin:    General: Skin is warm and dry.     Capillary Refill: Capillary refill takes less than 2 seconds.  Neurological:     Mental Status: He is alert and oriented to person, place, and time.  Psychiatric:        Behavior: Behavior normal.      Musculoskeletal Exam: C-spine has limited ROM with lateral rotation.   thoracic spine and lumbar spine have good range of motion.  No midline spinal tenderness.  No SI joint tenderness.  Shoulder joints, elbow joints, wrist joints, MCPs,  PIPs, DIPs have good range of motion with no synovitis. Right middle trigger finger. Complete fist formation bilaterally.  Hip joints have good range of motion with no groin pain. Right trochanteric bursa tenderness.  Knee joints have good range of motion no warmth or effusion.  Ankle joints have good range of motion no tenderness or joint swelling.  No evidence of Achilles tendinitis or plantar fasciitis.   CDAI Exam: CDAI Score: -- Patient Global: --; Provider Global: -- Swollen: --; Tender: -- Joint Exam 04/20/2024   No joint exam has been documented for this visit   There is currently no information documented on the homunculus. Go to the Rheumatology activity and complete the homunculus joint exam.  Investigation: No additional findings.  Imaging: No results found.  Recent Labs: Lab Results  Component Value Date   WBC 7.1 03/05/2024   HGB 13.8 03/05/2024   PLT 402 (H) 03/05/2024   NA 138 03/05/2024   K 4.3 03/05/2024   CL 99 03/05/2024   CO2 31 03/05/2024   GLUCOSE 86 03/05/2024   BUN 14 03/05/2024   CREATININE 1.18 03/05/2024   BILITOT 0.5 03/05/2024   ALKPHOS 67 06/08/2019   AST 16 03/05/2024   ALT 15 03/05/2024   PROT 7.1 03/05/2024   ALBUMIN 4.9 06/08/2019   CALCIUM 10.0 03/05/2024   GFRAA 69 11/23/2020   QFTBGOLDPLUS NEGATIVE 05/10/2023    Speciality Comments: Please ensure TB Gold is drawn yearly.  Procedures:  No procedures performed Allergies: Augmentin [amoxicillin-pot clavulanate] and Flagyl [metronidazole]   Assessment / Plan:     Visit Diagnoses: Inflammatory arthritis: He has no synovitis or dactylitis on examination today.  No evidence of Achilles tendinitis or plantar fasciitis.  No SI joint tenderness upon palpation.  Experiences intermittent discomfort in both shoulders but has good range of motion on examination today.  X-rays of the right shoulder were obtained on 10/14/2023 which were unremarkable.  He has also had intermittent discomfort in  the right hip especially after sitting for prolonged periods of time and rising from a seated position.  On examination he has good range of motion of the right hip joint with no groin pain.  Some tenderness over the right trochanteric bursa was noted.  Overall his symptoms have been manageable to the point that he does not want to proceed with a cortisone injection at this time.  He will notify us  if he develops any new or worsening symptoms.  He will remain on Xeljanz 5 mg 1 tablet twice daily and methotrexate  1 mg sq injections once weekly.  He will follow up in 5 months or sooner if needed.   High risk medication use - Xeljanz 5 mg 1 tablet by mouth twice daily-Prescribed by Dr. Kristie, Methotrexate  1 mL sq injection every 7 days, and folic acid  1 mg 2 tablets daily.  CBC and CMP updated on 03/05/24.  His next lab work will be due in December and every 3 months. Plan to obtain lab results from Dr. Husain.   TB gold negative on 05/10/23. Future order placed today.  No recurrent infections.  Discussed the importance of holding xeljanz and methotrexate  if he develops signs or symptoms of an infection and to resume once the infection has completely cleared. He received the annual flu shot 1 week ago.  Plan: QuantiFERON-TB Gold Plus  Screening for tuberculosis -Future order for TB gold placed today.  Plan: QuantiFERON-TB Gold Plus  Other ulcerative colitis with other complication [facility]): He remains under the care of Dr. Kristie.  No signs or symptoms of an ulcerative colitis flare.  He will be having a routine colonoscopy in March 2026.   No medication changes will be made at this time.   Pain in right hand: X-rays of the right hand were consistent with osteoarthritis.  No erosive changes were noted on 10/14/2023.  No synovitis noted on examination today.  Chronic pain of right knee: He has good range of motion of the right knee joint on examination today.  No warmth or effusion noted.  His symptoms have  been well-controlled.  Trigger middle finger of right hand: He continues to have locking and tenderness of the right middle finger.  He will notify us   when and if he would like to return for an ultrasound-guided cortisone injection in the future.  DDD (degenerative disc disease), cervical: C-spine has limited ROM with lateral rotation.   Other medical conditions are listed as follows:  Mixed hyperlipidemia: Plan to obtain lipid panel from PCP.  Primary hypertension: Blood pressure was 130/82 today in the office.  Iron deficiency  Orders: Orders Placed This Encounter  Procedures   QuantiFERON-TB Gold Plus   No orders of the defined types were placed in this encounter.    Follow-Up Instructions: Return in about 5 months (around 09/18/2024) for Inflammatory arthritis, UC.   Waddell CHRISTELLA Craze, PA-C  Note - This record has been created using Dragon software.  Chart creation errors have been sought, but may not always  have been located. Such creation errors do not reflect on  the standard of medical care.

## 2024-04-13 ENCOUNTER — Other Ambulatory Visit (HOSPITAL_BASED_OUTPATIENT_CLINIC_OR_DEPARTMENT_OTHER): Payer: Self-pay

## 2024-04-13 MED ORDER — VALSARTAN-HYDROCHLOROTHIAZIDE 80-12.5 MG PO TABS
1.0000 | ORAL_TABLET | Freq: Every day | ORAL | 5 refills | Status: AC
  Filled 2024-04-13 – 2024-06-06 (×2): qty 90, 90d supply, fill #0

## 2024-04-20 ENCOUNTER — Ambulatory Visit: Payer: PRIVATE HEALTH INSURANCE | Attending: Physician Assistant | Admitting: Physician Assistant

## 2024-04-20 ENCOUNTER — Encounter: Payer: Self-pay | Admitting: Physician Assistant

## 2024-04-20 ENCOUNTER — Other Ambulatory Visit: Payer: Self-pay

## 2024-04-20 VITALS — BP 130/82 | HR 93 | Temp 98.2°F | Resp 14 | Ht 67.0 in | Wt 186.0 lb

## 2024-04-20 DIAGNOSIS — E611 Iron deficiency: Secondary | ICD-10-CM | POA: Diagnosis not present

## 2024-04-20 DIAGNOSIS — I10 Essential (primary) hypertension: Secondary | ICD-10-CM

## 2024-04-20 DIAGNOSIS — Z79899 Other long term (current) drug therapy: Secondary | ICD-10-CM | POA: Diagnosis not present

## 2024-04-20 DIAGNOSIS — M65331 Trigger finger, right middle finger: Secondary | ICD-10-CM

## 2024-04-20 DIAGNOSIS — M79641 Pain in right hand: Secondary | ICD-10-CM

## 2024-04-20 DIAGNOSIS — M1380 Other specified arthritis, unspecified site: Secondary | ICD-10-CM | POA: Diagnosis not present

## 2024-04-20 DIAGNOSIS — M25561 Pain in right knee: Secondary | ICD-10-CM

## 2024-04-20 DIAGNOSIS — Z111 Encounter for screening for respiratory tuberculosis: Secondary | ICD-10-CM

## 2024-04-20 DIAGNOSIS — M5030 Other cervical disc degeneration, unspecified cervical region: Secondary | ICD-10-CM

## 2024-04-20 DIAGNOSIS — G8929 Other chronic pain: Secondary | ICD-10-CM

## 2024-04-20 DIAGNOSIS — K51818 Other ulcerative colitis with other complication: Secondary | ICD-10-CM

## 2024-04-20 DIAGNOSIS — E782 Mixed hyperlipidemia: Secondary | ICD-10-CM

## 2024-04-20 MED ORDER — METHOTREXATE SODIUM CHEMO INJECTION 50 MG/2ML
25.0000 mg | INTRAMUSCULAR | 0 refills | Status: DC
  Filled 2024-04-20: qty 4, 28d supply, fill #0

## 2024-04-20 NOTE — Progress Notes (Unsigned)
 Contacted Dr. Lita office and they should fax over the patient's most recent labs. Patient seen in office today, please review and sign.

## 2024-04-21 ENCOUNTER — Other Ambulatory Visit (HOSPITAL_BASED_OUTPATIENT_CLINIC_OR_DEPARTMENT_OTHER): Payer: Self-pay

## 2024-04-22 ENCOUNTER — Telehealth: Payer: Self-pay

## 2024-04-22 NOTE — Telephone Encounter (Signed)
 Labs received from:Dr. Husain's Office  Drawn on:04/13/2024  Reviewed by:Waddell Craze, PA-C  Labs drawn:Lipid Panel w/ Reflex, PSA, and TSH  Results:Cholesterol 222 LDL 131 Non HDL 151

## 2024-05-29 ENCOUNTER — Other Ambulatory Visit (HOSPITAL_BASED_OUTPATIENT_CLINIC_OR_DEPARTMENT_OTHER): Payer: Self-pay

## 2024-05-29 MED ORDER — PREDNISONE 5 MG (21) PO TBPK
ORAL_TABLET | ORAL | 0 refills | Status: AC
  Filled 2024-05-29: qty 21, 6d supply, fill #0

## 2024-05-29 MED ORDER — CETIRIZINE HCL 10 MG PO TABS
10.0000 mg | ORAL_TABLET | Freq: Every day | ORAL | 0 refills | Status: DC
  Filled 2024-05-29: qty 30, 30d supply, fill #0

## 2024-06-06 ENCOUNTER — Other Ambulatory Visit: Payer: Self-pay | Admitting: Physician Assistant

## 2024-06-08 ENCOUNTER — Other Ambulatory Visit: Payer: Self-pay

## 2024-06-08 ENCOUNTER — Other Ambulatory Visit (HOSPITAL_BASED_OUTPATIENT_CLINIC_OR_DEPARTMENT_OTHER): Payer: Self-pay

## 2024-06-08 MED ORDER — METHOTREXATE SODIUM CHEMO INJECTION 50 MG/2ML
25.0000 mg | INTRAMUSCULAR | 2 refills | Status: AC
  Filled 2024-06-08: qty 4, 28d supply, fill #0
  Filled 2024-07-15: qty 4, 28d supply, fill #1

## 2024-06-08 MED ORDER — FOLIC ACID 1 MG PO TABS
2.0000 mg | ORAL_TABLET | Freq: Every day | ORAL | 3 refills | Status: AC
  Filled 2024-06-08: qty 180, 90d supply, fill #0

## 2024-06-08 NOTE — Telephone Encounter (Signed)
 Last Fill: 04/20/2024 (MTX, 30 day supply) 06/20/2023 (Folic Acid )  Labs: 03/05/2024 CBC and CMP are stable.  Platelets are mildly elevated.   Next Visit: 09/21/2024  Last Visit: 04/20/2024  DX: Inflammatory arthritis   Current Dose per office note 04/20/2024: Methotrexate  1 mL sq injection every 7 days, and folic acid  1 mg 2 tablets daily.   Okay to refill Methotrexate  and Folic Acid ?

## 2024-06-10 ENCOUNTER — Other Ambulatory Visit (HOSPITAL_BASED_OUTPATIENT_CLINIC_OR_DEPARTMENT_OTHER): Payer: Self-pay

## 2024-06-26 ENCOUNTER — Other Ambulatory Visit: Payer: Self-pay | Admitting: Gastroenterology

## 2024-06-26 DIAGNOSIS — R1033 Periumbilical pain: Secondary | ICD-10-CM

## 2024-07-06 ENCOUNTER — Inpatient Hospital Stay
Admission: RE | Admit: 2024-07-06 | Discharge: 2024-07-06 | Disposition: A | Payer: PRIVATE HEALTH INSURANCE | Source: Ambulatory Visit | Attending: Gastroenterology | Admitting: Gastroenterology

## 2024-07-06 DIAGNOSIS — R1033 Periumbilical pain: Secondary | ICD-10-CM

## 2024-07-06 MED ORDER — IOPAMIDOL (ISOVUE-300) INJECTION 61%
75.0000 mL | Freq: Once | INTRAVENOUS | Status: AC | PRN
  Administered 2024-07-06: 75 mL via INTRAVENOUS

## 2024-07-22 ENCOUNTER — Other Ambulatory Visit (HOSPITAL_BASED_OUTPATIENT_CLINIC_OR_DEPARTMENT_OTHER): Payer: Self-pay

## 2024-07-22 MED ORDER — CETIRIZINE HCL 10 MG PO TABS
10.0000 mg | ORAL_TABLET | Freq: Every day | ORAL | 0 refills | Status: AC
  Filled 2024-07-22: qty 30, 30d supply, fill #0

## 2024-09-21 ENCOUNTER — Ambulatory Visit: Payer: PRIVATE HEALTH INSURANCE | Admitting: Physician Assistant
